# Patient Record
Sex: Female | Born: 1935 | Race: White | Hispanic: No | Smoking: Former smoker
Health system: Southern US, Community
[De-identification: ages and names within clinical notes are randomized; demographics above are authoritative.]

## PROBLEM LIST (undated history)

## (undated) DIAGNOSIS — R002 Palpitations: Secondary | ICD-10-CM

## (undated) DIAGNOSIS — I639 Cerebral infarction, unspecified: Secondary | ICD-10-CM

## (undated) DIAGNOSIS — D649 Anemia, unspecified: Secondary | ICD-10-CM

## (undated) DIAGNOSIS — F329 Major depressive disorder, single episode, unspecified: Secondary | ICD-10-CM

## (undated) DIAGNOSIS — K219 Gastro-esophageal reflux disease without esophagitis: Secondary | ICD-10-CM

## (undated) DIAGNOSIS — B019 Varicella without complication: Secondary | ICD-10-CM

## (undated) DIAGNOSIS — E7800 Pure hypercholesterolemia, unspecified: Secondary | ICD-10-CM

## (undated) DIAGNOSIS — F32A Depression, unspecified: Secondary | ICD-10-CM

## (undated) DIAGNOSIS — I10 Essential (primary) hypertension: Secondary | ICD-10-CM

## (undated) HISTORY — DX: Cerebral infarction, unspecified: I63.9

## (undated) HISTORY — DX: Gastro-esophageal reflux disease without esophagitis: K21.9

## (undated) HISTORY — DX: Palpitations: R00.2

## (undated) HISTORY — DX: Pure hypercholesterolemia, unspecified: E78.00

## (undated) HISTORY — DX: Varicella without complication: B01.9

## (undated) HISTORY — DX: Essential (primary) hypertension: I10

## (undated) HISTORY — DX: Major depressive disorder, single episode, unspecified: F32.9

## (undated) HISTORY — DX: Anemia, unspecified: D64.9

## (undated) HISTORY — DX: Depression, unspecified: F32.A

## (undated) HISTORY — PX: APPENDECTOMY: SHX54

---

## 2004-06-24 ENCOUNTER — Ambulatory Visit: Payer: Self-pay | Admitting: Internal Medicine

## 2004-08-15 ENCOUNTER — Inpatient Hospital Stay: Payer: Self-pay | Admitting: Internal Medicine

## 2004-08-15 ENCOUNTER — Other Ambulatory Visit: Payer: Self-pay

## 2004-08-18 ENCOUNTER — Ambulatory Visit: Payer: Self-pay | Admitting: Physical Medicine & Rehabilitation

## 2004-08-18 ENCOUNTER — Inpatient Hospital Stay (HOSPITAL_COMMUNITY)
Admission: RE | Admit: 2004-08-18 | Discharge: 2004-08-26 | Payer: Self-pay | Admitting: Physical Medicine & Rehabilitation

## 2004-08-24 DIAGNOSIS — I639 Cerebral infarction, unspecified: Secondary | ICD-10-CM

## 2004-08-24 HISTORY — DX: Cerebral infarction, unspecified: I63.9

## 2004-10-04 ENCOUNTER — Ambulatory Visit: Payer: Self-pay | Admitting: Physical Medicine & Rehabilitation

## 2004-10-04 ENCOUNTER — Encounter
Admission: RE | Admit: 2004-10-04 | Discharge: 2005-01-02 | Payer: Self-pay | Admitting: Physical Medicine & Rehabilitation

## 2005-07-06 ENCOUNTER — Ambulatory Visit: Payer: Self-pay | Admitting: Internal Medicine

## 2006-03-10 ENCOUNTER — Ambulatory Visit: Payer: Self-pay | Admitting: Ophthalmology

## 2006-03-20 ENCOUNTER — Ambulatory Visit: Payer: Self-pay | Admitting: Ophthalmology

## 2006-07-05 ENCOUNTER — Ambulatory Visit: Payer: Self-pay | Admitting: Ophthalmology

## 2006-07-06 ENCOUNTER — Ambulatory Visit: Payer: Self-pay | Admitting: Internal Medicine

## 2006-07-10 ENCOUNTER — Ambulatory Visit: Payer: Self-pay | Admitting: Ophthalmology

## 2006-08-01 ENCOUNTER — Ambulatory Visit: Payer: Self-pay | Admitting: Internal Medicine

## 2007-09-04 ENCOUNTER — Ambulatory Visit: Payer: Self-pay | Admitting: Internal Medicine

## 2007-10-08 ENCOUNTER — Ambulatory Visit: Payer: Self-pay | Admitting: Gastroenterology

## 2008-09-22 ENCOUNTER — Ambulatory Visit: Payer: Self-pay | Admitting: Internal Medicine

## 2009-07-05 ENCOUNTER — Emergency Department: Payer: Self-pay | Admitting: Emergency Medicine

## 2010-06-02 ENCOUNTER — Ambulatory Visit: Payer: Self-pay | Admitting: Internal Medicine

## 2011-02-04 ENCOUNTER — Ambulatory Visit: Payer: Self-pay | Admitting: Gastroenterology

## 2011-02-04 DIAGNOSIS — K5730 Diverticulosis of large intestine without perforation or abscess without bleeding: Secondary | ICD-10-CM | POA: Diagnosis not present

## 2011-02-04 DIAGNOSIS — Z86010 Personal history of colonic polyps: Secondary | ICD-10-CM | POA: Diagnosis not present

## 2011-02-04 DIAGNOSIS — D649 Anemia, unspecified: Secondary | ICD-10-CM | POA: Diagnosis not present

## 2011-02-04 DIAGNOSIS — Z09 Encounter for follow-up examination after completed treatment for conditions other than malignant neoplasm: Secondary | ICD-10-CM | POA: Diagnosis not present

## 2011-02-04 DIAGNOSIS — I498 Other specified cardiac arrhythmias: Secondary | ICD-10-CM | POA: Diagnosis not present

## 2011-02-04 DIAGNOSIS — Q438 Other specified congenital malformations of intestine: Secondary | ICD-10-CM | POA: Diagnosis not present

## 2011-02-04 DIAGNOSIS — I10 Essential (primary) hypertension: Secondary | ICD-10-CM | POA: Diagnosis not present

## 2011-02-04 DIAGNOSIS — E7800 Pure hypercholesterolemia, unspecified: Secondary | ICD-10-CM | POA: Diagnosis not present

## 2011-02-04 DIAGNOSIS — Z8679 Personal history of other diseases of the circulatory system: Secondary | ICD-10-CM | POA: Diagnosis not present

## 2011-02-04 DIAGNOSIS — Z79899 Other long term (current) drug therapy: Secondary | ICD-10-CM | POA: Diagnosis not present

## 2011-02-04 DIAGNOSIS — Z5309 Procedure and treatment not carried out because of other contraindication: Secondary | ICD-10-CM | POA: Diagnosis not present

## 2011-02-04 DIAGNOSIS — R1013 Epigastric pain: Secondary | ICD-10-CM | POA: Diagnosis not present

## 2011-02-04 DIAGNOSIS — L851 Acquired keratosis [keratoderma] palmaris et plantaris: Secondary | ICD-10-CM | POA: Diagnosis not present

## 2011-02-04 DIAGNOSIS — G43909 Migraine, unspecified, not intractable, without status migrainosus: Secondary | ICD-10-CM | POA: Diagnosis not present

## 2011-02-08 LAB — PATHOLOGY REPORT

## 2011-02-14 DIAGNOSIS — N289 Disorder of kidney and ureter, unspecified: Secondary | ICD-10-CM | POA: Diagnosis not present

## 2011-02-14 DIAGNOSIS — K219 Gastro-esophageal reflux disease without esophagitis: Secondary | ICD-10-CM | POA: Diagnosis not present

## 2011-02-14 DIAGNOSIS — D649 Anemia, unspecified: Secondary | ICD-10-CM | POA: Diagnosis not present

## 2011-02-14 DIAGNOSIS — R634 Abnormal weight loss: Secondary | ICD-10-CM | POA: Diagnosis not present

## 2011-02-14 DIAGNOSIS — R1084 Generalized abdominal pain: Secondary | ICD-10-CM | POA: Diagnosis not present

## 2011-02-15 DIAGNOSIS — N289 Disorder of kidney and ureter, unspecified: Secondary | ICD-10-CM | POA: Diagnosis not present

## 2011-02-17 ENCOUNTER — Ambulatory Visit: Payer: Self-pay | Admitting: Gastroenterology

## 2011-02-17 DIAGNOSIS — K219 Gastro-esophageal reflux disease without esophagitis: Secondary | ICD-10-CM | POA: Diagnosis not present

## 2011-02-17 DIAGNOSIS — K449 Diaphragmatic hernia without obstruction or gangrene: Secondary | ICD-10-CM | POA: Diagnosis not present

## 2011-03-01 DIAGNOSIS — D649 Anemia, unspecified: Secondary | ICD-10-CM | POA: Diagnosis not present

## 2011-03-01 DIAGNOSIS — K219 Gastro-esophageal reflux disease without esophagitis: Secondary | ICD-10-CM | POA: Diagnosis not present

## 2011-04-05 DIAGNOSIS — E7800 Pure hypercholesterolemia, unspecified: Secondary | ICD-10-CM | POA: Diagnosis not present

## 2011-04-05 DIAGNOSIS — Z79899 Other long term (current) drug therapy: Secondary | ICD-10-CM | POA: Diagnosis not present

## 2011-04-05 DIAGNOSIS — N289 Disorder of kidney and ureter, unspecified: Secondary | ICD-10-CM | POA: Diagnosis not present

## 2011-04-05 DIAGNOSIS — D649 Anemia, unspecified: Secondary | ICD-10-CM | POA: Diagnosis not present

## 2011-04-12 DIAGNOSIS — E7800 Pure hypercholesterolemia, unspecified: Secondary | ICD-10-CM | POA: Diagnosis not present

## 2011-04-12 DIAGNOSIS — I10 Essential (primary) hypertension: Secondary | ICD-10-CM | POA: Diagnosis not present

## 2011-04-12 DIAGNOSIS — R42 Dizziness and giddiness: Secondary | ICD-10-CM | POA: Diagnosis not present

## 2011-04-12 DIAGNOSIS — N289 Disorder of kidney and ureter, unspecified: Secondary | ICD-10-CM | POA: Diagnosis not present

## 2011-04-14 DIAGNOSIS — N289 Disorder of kidney and ureter, unspecified: Secondary | ICD-10-CM | POA: Diagnosis not present

## 2011-04-15 DIAGNOSIS — N289 Disorder of kidney and ureter, unspecified: Secondary | ICD-10-CM | POA: Diagnosis not present

## 2011-04-20 ENCOUNTER — Ambulatory Visit: Payer: Self-pay | Admitting: Internal Medicine

## 2011-04-20 DIAGNOSIS — N289 Disorder of kidney and ureter, unspecified: Secondary | ICD-10-CM | POA: Diagnosis not present

## 2011-04-21 DIAGNOSIS — N289 Disorder of kidney and ureter, unspecified: Secondary | ICD-10-CM | POA: Diagnosis not present

## 2011-05-11 DIAGNOSIS — N289 Disorder of kidney and ureter, unspecified: Secondary | ICD-10-CM | POA: Diagnosis not present

## 2011-05-16 DIAGNOSIS — M549 Dorsalgia, unspecified: Secondary | ICD-10-CM | POA: Diagnosis not present

## 2011-05-16 DIAGNOSIS — D649 Anemia, unspecified: Secondary | ICD-10-CM | POA: Diagnosis not present

## 2011-05-16 DIAGNOSIS — E7800 Pure hypercholesterolemia, unspecified: Secondary | ICD-10-CM | POA: Diagnosis not present

## 2011-05-16 DIAGNOSIS — I10 Essential (primary) hypertension: Secondary | ICD-10-CM | POA: Diagnosis not present

## 2011-05-19 ENCOUNTER — Ambulatory Visit: Payer: Self-pay | Admitting: Internal Medicine

## 2011-05-19 DIAGNOSIS — R0989 Other specified symptoms and signs involving the circulatory and respiratory systems: Secondary | ICD-10-CM | POA: Diagnosis not present

## 2011-05-19 DIAGNOSIS — I6529 Occlusion and stenosis of unspecified carotid artery: Secondary | ICD-10-CM | POA: Diagnosis not present

## 2011-06-01 DIAGNOSIS — L819 Disorder of pigmentation, unspecified: Secondary | ICD-10-CM | POA: Diagnosis not present

## 2011-06-01 DIAGNOSIS — L821 Other seborrheic keratosis: Secondary | ICD-10-CM | POA: Diagnosis not present

## 2011-06-01 DIAGNOSIS — D692 Other nonthrombocytopenic purpura: Secondary | ICD-10-CM | POA: Diagnosis not present

## 2011-06-01 DIAGNOSIS — D1801 Hemangioma of skin and subcutaneous tissue: Secondary | ICD-10-CM | POA: Diagnosis not present

## 2011-06-01 DIAGNOSIS — C44319 Basal cell carcinoma of skin of other parts of face: Secondary | ICD-10-CM | POA: Diagnosis not present

## 2011-06-29 DIAGNOSIS — C44319 Basal cell carcinoma of skin of other parts of face: Secondary | ICD-10-CM | POA: Diagnosis not present

## 2011-06-29 DIAGNOSIS — L578 Other skin changes due to chronic exposure to nonionizing radiation: Secondary | ICD-10-CM | POA: Diagnosis not present

## 2011-06-29 DIAGNOSIS — L821 Other seborrheic keratosis: Secondary | ICD-10-CM | POA: Diagnosis not present

## 2011-06-29 DIAGNOSIS — L570 Actinic keratosis: Secondary | ICD-10-CM | POA: Diagnosis not present

## 2011-07-25 DIAGNOSIS — E7800 Pure hypercholesterolemia, unspecified: Secondary | ICD-10-CM | POA: Diagnosis not present

## 2011-07-25 DIAGNOSIS — Z79899 Other long term (current) drug therapy: Secondary | ICD-10-CM | POA: Diagnosis not present

## 2011-07-25 DIAGNOSIS — I10 Essential (primary) hypertension: Secondary | ICD-10-CM | POA: Diagnosis not present

## 2011-07-25 LAB — LIPID PANEL
Cholesterol: 174 mg/dL (ref 0–200)
HDL: 50 mg/dL (ref 35–70)
LDL Cholesterol: 93 mg/dL
Triglycerides: 153 mg/dL (ref 40–160)

## 2011-08-01 DIAGNOSIS — E7800 Pure hypercholesterolemia, unspecified: Secondary | ICD-10-CM | POA: Diagnosis not present

## 2011-08-01 DIAGNOSIS — I6350 Cerebral infarction due to unspecified occlusion or stenosis of unspecified cerebral artery: Secondary | ICD-10-CM | POA: Diagnosis not present

## 2011-08-01 DIAGNOSIS — D649 Anemia, unspecified: Secondary | ICD-10-CM | POA: Diagnosis not present

## 2011-08-01 DIAGNOSIS — I10 Essential (primary) hypertension: Secondary | ICD-10-CM | POA: Diagnosis not present

## 2011-08-22 DIAGNOSIS — D649 Anemia, unspecified: Secondary | ICD-10-CM | POA: Diagnosis not present

## 2011-09-06 DIAGNOSIS — D649 Anemia, unspecified: Secondary | ICD-10-CM | POA: Diagnosis not present

## 2011-10-20 DIAGNOSIS — Z23 Encounter for immunization: Secondary | ICD-10-CM | POA: Diagnosis not present

## 2011-10-25 DIAGNOSIS — Z85828 Personal history of other malignant neoplasm of skin: Secondary | ICD-10-CM | POA: Diagnosis not present

## 2011-10-25 DIAGNOSIS — L905 Scar conditions and fibrosis of skin: Secondary | ICD-10-CM | POA: Diagnosis not present

## 2011-11-07 DIAGNOSIS — D649 Anemia, unspecified: Secondary | ICD-10-CM | POA: Diagnosis not present

## 2012-01-09 ENCOUNTER — Encounter: Payer: Self-pay | Admitting: *Deleted

## 2012-01-09 DIAGNOSIS — I10 Essential (primary) hypertension: Secondary | ICD-10-CM

## 2012-01-09 DIAGNOSIS — E7800 Pure hypercholesterolemia, unspecified: Secondary | ICD-10-CM

## 2012-01-09 DIAGNOSIS — I639 Cerebral infarction, unspecified: Secondary | ICD-10-CM

## 2012-01-10 ENCOUNTER — Encounter: Payer: Self-pay | Admitting: *Deleted

## 2012-01-10 ENCOUNTER — Ambulatory Visit (INDEPENDENT_AMBULATORY_CARE_PROVIDER_SITE_OTHER): Payer: Medicare Other | Admitting: Internal Medicine

## 2012-01-10 ENCOUNTER — Encounter: Payer: Self-pay | Admitting: Internal Medicine

## 2012-01-10 VITALS — BP 134/64 | HR 72 | Temp 98.3°F | Ht 62.5 in | Wt 135.5 lb

## 2012-01-10 DIAGNOSIS — I10 Essential (primary) hypertension: Secondary | ICD-10-CM | POA: Insufficient documentation

## 2012-01-10 DIAGNOSIS — R0989 Other specified symptoms and signs involving the circulatory and respiratory systems: Secondary | ICD-10-CM

## 2012-01-10 DIAGNOSIS — I6350 Cerebral infarction due to unspecified occlusion or stenosis of unspecified cerebral artery: Secondary | ICD-10-CM | POA: Insufficient documentation

## 2012-01-10 DIAGNOSIS — E7800 Pure hypercholesterolemia, unspecified: Secondary | ICD-10-CM

## 2012-01-10 DIAGNOSIS — Z8673 Personal history of transient ischemic attack (TIA), and cerebral infarction without residual deficits: Secondary | ICD-10-CM | POA: Insufficient documentation

## 2012-01-10 DIAGNOSIS — N289 Disorder of kidney and ureter, unspecified: Secondary | ICD-10-CM

## 2012-01-10 DIAGNOSIS — D649 Anemia, unspecified: Secondary | ICD-10-CM

## 2012-01-10 DIAGNOSIS — I639 Cerebral infarction, unspecified: Secondary | ICD-10-CM

## 2012-01-10 DIAGNOSIS — M79609 Pain in unspecified limb: Secondary | ICD-10-CM | POA: Diagnosis not present

## 2012-01-10 DIAGNOSIS — M79603 Pain in arm, unspecified: Secondary | ICD-10-CM

## 2012-01-11 ENCOUNTER — Encounter: Payer: Self-pay | Admitting: Internal Medicine

## 2012-01-11 DIAGNOSIS — N289 Disorder of kidney and ureter, unspecified: Secondary | ICD-10-CM | POA: Insufficient documentation

## 2012-01-11 DIAGNOSIS — D509 Iron deficiency anemia, unspecified: Secondary | ICD-10-CM | POA: Insufficient documentation

## 2012-01-11 NOTE — Assessment & Plan Note (Signed)
Last Cr stable - 1.3.  Renal ultrasound 04/20/11 - negative.  U/a did not reveal blood or protein.  Follow.

## 2012-01-11 NOTE — Progress Notes (Signed)
Subjective:    Patient ID: Rachel Martinez, female    DOB: July 16, 1927, 78 y.o.   MRN: [id number]  HPI 78 year old female with past history of hypertension, hypercholesterolemia and CVA who comes in today for a scheduled follow up.  She states she is doing relatively well.  Feels things are stable.  She is having some right upper arm pain.  States she tripped and fell approximately 6 weeks ago.  Caught herself with her right hand and arm.  Since that time, has been having pain in her right upper arm.  Hurts to try and fully extend her arm.  No bruising.  Did not hit her head.  Lying down - worse.  No chest pain or tightness.  No sob.  States she is eating and drinking well.  Bowels stable.   Past Medical History  Diagnosis Date  . Heart palpitations   . Hypercholesteremia   . Hypertension   . CVA (cerebral vascular accident) 8/06  . Chicken pox   . Stroke     Current Outpatient Prescriptions on File Prior to Visit  Medication Sig Dispense Refill  . calcium carbonate (TUMS - DOSED IN MG ELEMENTAL CALCIUM) 500 MG chewable tablet Chew 1 tablet by mouth 3 (three) times daily.      Marland Kitchen diltiazem (TIAZAC) 120 MG 24 hr capsule Take 120 mg by mouth daily.      Marland Kitchen dipyridamole-aspirin (AGGRENOX) 200-25 MG per 12 hr capsule Take 1 capsule by mouth 2 (two) times daily.      Marland Kitchen lisinopril (PRINIVIL,ZESTRIL) 10 MG tablet Take 10 mg by mouth daily.      . pravastatin (PRAVACHOL) 40 MG tablet Take 40 mg by mouth daily.      . fish oil-omega-3 fatty acids 1000 MG capsule Take 1 g by mouth 2 (two) times daily.       Marland Kitchen FLUoxetine (PROZAC) 10 MG capsule Take 10 mg by mouth daily.      Marland Kitchen oxybutynin (DITROPAN) 5 MG tablet Take 5 mg by mouth daily.      . pantoprazole (PROTONIX) 40 MG tablet Take 40 mg by mouth daily.        Review of Systems Patient denies any headache, lightheadedness or dizziness.  No significant sinus or allergy symptoms.  No chest pain, tightness or palpitations.  No increased shortness of  breath, cough or congestion.  No nausea or vomiting.  No abdominal pain or cramping.  No bowel change, such as diarrhea, constipation, BRBPR or melana.  No urine change.  Upper arm pain - persistent as outlined.  Pain with increased rom.       Objective:   Physical Exam Filed Vitals:   01/10/12 1131  BP: 134/64  Pulse: 72  Temp: 98.3 F (29.38 C)   78 year old female in no acute distress.   HEENT:  Nares - clear.  OP- without lesions or erythema.  NECK:  Supple, nontender.  Right carotid bruit.   HEART:  Appears to be regular. LUNGS:  Without crackles or wheezing audible.  Respirations even and unlabored.   RADIAL PULSE:  Equal bilaterally.  ABDOMEN:  Soft, nontender.  No audible abdominal bruit.   EXTREMITIES:  No increased edema to be present.  Increased pain upper arm with attempts at full extension.  No pain to palpation.  Pain more localized in the mid to upper arm.  Assessment & Plan:  MSK.  Persistent upper arm pain s/p fall.  See above.  Unable to take antiinflammatories.  Tylenol not helping.  Avoid narcotics.  Will refer to physical therapy for evaluation and treatment.    RIGHT CAROTID BRUIT.  Check carotid ultrasound.    PREVIOUS WEIGHT LOSS.  Weight had been stable recently.  Last weight 137.  Today 135.  Had GI w/up.  Unable to perform EGD.  Colonoscopy performed.  Had UGI that revealed a hiatal hernia and reflux.  On protonix.  States she is eating and drinking well.  Follow.  Currently asymptomatic.  Follow.   INCREASED PSYCHOSOCIAL STRESSORS.  Currently stable.  Follow.    HEALTH MAINTENANCE.  GI w/up as outlined.  Schedule her for a physical.  Need results of last mammogram.  Apparently had over the summer.

## 2012-01-11 NOTE — Assessment & Plan Note (Signed)
On Aggrenox and doing well.  Follow.

## 2012-01-11 NOTE — Assessment & Plan Note (Signed)
Blood pressure under good control.  Same medications.  Follow metabolic panel.    

## 2012-01-11 NOTE — Assessment & Plan Note (Signed)
On pravastatin.  Follow lipid profile and liver panel.    

## 2012-01-11 NOTE — Assessment & Plan Note (Signed)
Had GI w/up as outlined.  Last hgb per her report - stable.  Follow.

## 2012-01-23 ENCOUNTER — Other Ambulatory Visit: Payer: Self-pay | Admitting: Internal Medicine

## 2012-01-23 MED ORDER — ASPIRIN-DIPYRIDAMOLE ER 25-200 MG PO CP12: 1.0000 | ORAL_CAPSULE | Freq: Two times a day (BID) | ORAL | Status: DC

## 2012-01-23 NOTE — Telephone Encounter (Signed)
Sent in to pharmacy.  

## 2012-01-23 NOTE — Telephone Encounter (Signed)
Aggrenox 25 mg -200 capsule  Take 1 capsule by mouth twice a day  # 30

## 2012-02-13 DIAGNOSIS — M25519 Pain in unspecified shoulder: Secondary | ICD-10-CM | POA: Diagnosis not present

## 2012-02-13 DIAGNOSIS — M79609 Pain in unspecified limb: Secondary | ICD-10-CM | POA: Diagnosis not present

## 2012-02-15 DIAGNOSIS — M79609 Pain in unspecified limb: Secondary | ICD-10-CM | POA: Diagnosis not present

## 2012-02-15 DIAGNOSIS — M25519 Pain in unspecified shoulder: Secondary | ICD-10-CM | POA: Diagnosis not present

## 2012-02-17 DIAGNOSIS — M79609 Pain in unspecified limb: Secondary | ICD-10-CM | POA: Diagnosis not present

## 2012-02-17 DIAGNOSIS — M25519 Pain in unspecified shoulder: Secondary | ICD-10-CM | POA: Diagnosis not present

## 2012-02-20 DIAGNOSIS — M79609 Pain in unspecified limb: Secondary | ICD-10-CM | POA: Diagnosis not present

## 2012-02-20 DIAGNOSIS — M25519 Pain in unspecified shoulder: Secondary | ICD-10-CM | POA: Diagnosis not present

## 2012-02-24 DIAGNOSIS — M25519 Pain in unspecified shoulder: Secondary | ICD-10-CM | POA: Diagnosis not present

## 2012-02-24 DIAGNOSIS — M79609 Pain in unspecified limb: Secondary | ICD-10-CM | POA: Diagnosis not present

## 2012-02-25 DIAGNOSIS — M79609 Pain in unspecified limb: Secondary | ICD-10-CM | POA: Diagnosis not present

## 2012-02-25 DIAGNOSIS — M25519 Pain in unspecified shoulder: Secondary | ICD-10-CM | POA: Diagnosis not present

## 2012-02-27 DIAGNOSIS — M25519 Pain in unspecified shoulder: Secondary | ICD-10-CM | POA: Diagnosis not present

## 2012-02-27 DIAGNOSIS — M79609 Pain in unspecified limb: Secondary | ICD-10-CM | POA: Diagnosis not present

## 2012-02-29 DIAGNOSIS — M25519 Pain in unspecified shoulder: Secondary | ICD-10-CM | POA: Diagnosis not present

## 2012-02-29 DIAGNOSIS — M79609 Pain in unspecified limb: Secondary | ICD-10-CM | POA: Diagnosis not present

## 2012-03-02 DIAGNOSIS — M79609 Pain in unspecified limb: Secondary | ICD-10-CM | POA: Diagnosis not present

## 2012-03-02 DIAGNOSIS — M25519 Pain in unspecified shoulder: Secondary | ICD-10-CM | POA: Diagnosis not present

## 2012-03-05 DIAGNOSIS — M79609 Pain in unspecified limb: Secondary | ICD-10-CM | POA: Diagnosis not present

## 2012-03-05 DIAGNOSIS — M25519 Pain in unspecified shoulder: Secondary | ICD-10-CM | POA: Diagnosis not present

## 2012-03-12 DIAGNOSIS — M25519 Pain in unspecified shoulder: Secondary | ICD-10-CM | POA: Diagnosis not present

## 2012-03-12 DIAGNOSIS — M79609 Pain in unspecified limb: Secondary | ICD-10-CM | POA: Diagnosis not present

## 2012-03-14 DIAGNOSIS — M79609 Pain in unspecified limb: Secondary | ICD-10-CM | POA: Diagnosis not present

## 2012-03-14 DIAGNOSIS — M25519 Pain in unspecified shoulder: Secondary | ICD-10-CM | POA: Diagnosis not present

## 2012-03-16 DIAGNOSIS — M25519 Pain in unspecified shoulder: Secondary | ICD-10-CM | POA: Diagnosis not present

## 2012-03-16 DIAGNOSIS — M79609 Pain in unspecified limb: Secondary | ICD-10-CM | POA: Diagnosis not present

## 2012-03-19 DIAGNOSIS — M79609 Pain in unspecified limb: Secondary | ICD-10-CM | POA: Diagnosis not present

## 2012-03-19 DIAGNOSIS — M25519 Pain in unspecified shoulder: Secondary | ICD-10-CM | POA: Diagnosis not present

## 2012-03-21 DIAGNOSIS — M79609 Pain in unspecified limb: Secondary | ICD-10-CM | POA: Diagnosis not present

## 2012-03-21 DIAGNOSIS — M25519 Pain in unspecified shoulder: Secondary | ICD-10-CM | POA: Diagnosis not present

## 2012-03-24 DIAGNOSIS — M79609 Pain in unspecified limb: Secondary | ICD-10-CM | POA: Diagnosis not present

## 2012-03-24 DIAGNOSIS — M25519 Pain in unspecified shoulder: Secondary | ICD-10-CM | POA: Diagnosis not present

## 2012-03-29 DIAGNOSIS — M25519 Pain in unspecified shoulder: Secondary | ICD-10-CM | POA: Diagnosis not present

## 2012-04-09 ENCOUNTER — Encounter: Payer: Self-pay | Admitting: Internal Medicine

## 2012-04-09 ENCOUNTER — Ambulatory Visit (INDEPENDENT_AMBULATORY_CARE_PROVIDER_SITE_OTHER): Payer: BLUE CROSS/BLUE SHIELD | Admitting: Internal Medicine

## 2012-04-09 VITALS — BP 130/60 | HR 62 | Temp 98.0°F | Ht 62.5 in | Wt 132.0 lb

## 2012-04-09 DIAGNOSIS — D649 Anemia, unspecified: Secondary | ICD-10-CM

## 2012-04-09 DIAGNOSIS — I10 Essential (primary) hypertension: Secondary | ICD-10-CM

## 2012-04-09 DIAGNOSIS — N289 Disorder of kidney and ureter, unspecified: Secondary | ICD-10-CM

## 2012-04-09 DIAGNOSIS — I6350 Cerebral infarction due to unspecified occlusion or stenosis of unspecified cerebral artery: Secondary | ICD-10-CM

## 2012-04-09 DIAGNOSIS — E7800 Pure hypercholesterolemia, unspecified: Secondary | ICD-10-CM | POA: Diagnosis not present

## 2012-04-09 DIAGNOSIS — Z1239 Encounter for other screening for malignant neoplasm of breast: Secondary | ICD-10-CM

## 2012-04-09 DIAGNOSIS — I639 Cerebral infarction, unspecified: Secondary | ICD-10-CM

## 2012-04-15 ENCOUNTER — Encounter: Payer: Self-pay | Admitting: Internal Medicine

## 2012-04-15 NOTE — Assessment & Plan Note (Signed)
On pravastatin.  Follow lipid profile and liver panel.    

## 2012-04-15 NOTE — Assessment & Plan Note (Signed)
Blood pressure under good control.  Same medications.  Follow metabolic panel.    

## 2012-04-15 NOTE — Assessment & Plan Note (Signed)
Had GI w/up as outlined.  Last hgb per her report - stable.  Follow.

## 2012-04-15 NOTE — Assessment & Plan Note (Signed)
On Aggrenox and doing well.  Follow.

## 2012-04-15 NOTE — Progress Notes (Signed)
Subjective:    Patient ID: Rachel Martinez, female    DOB: 10-16-1927, 79 y.o.   MRN: [id number]  HPI 79 year old female with past history of hypertension, hypercholesterolemia and CVA who comes in today to follow up on these issues as well as for a complete physical exam.  She states she is doing relatively well.  Feels things are stable.  Went to physical therapy for her right arm.  Doing better.  She is doing exercises at home now.  Eating and drinking well.  Breathing stable. Bowels stable.   Past Medical History  Diagnosis Date  . Heart palpitations   . Hypercholesteremia   . Hypertension   . CVA (cerebral vascular accident) 8/06  . Chicken pox   . Stroke     Current Outpatient Prescriptions on File Prior to Visit  Medication Sig Dispense Refill  . calcium carbonate (TUMS - DOSED IN MG ELEMENTAL CALCIUM) 500 MG chewable tablet Chew 1 tablet by mouth 3 (three) times daily.      Marland Kitchen diltiazem (TIAZAC) 120 MG 24 hr capsule Take 120 mg by mouth daily.      Marland Kitchen dipyridamole-aspirin (AGGRENOX) 200-25 MG per 12 hr capsule Take 1 capsule by mouth 2 (two) times daily.  60 capsule  5  . fish oil-omega-3 fatty acids 1000 MG capsule Take 1 g by mouth 2 (two) times daily.       Marland Kitchen FLUoxetine (PROZAC) 10 MG capsule Take 10 mg by mouth daily.      Marland Kitchen lisinopril (PRINIVIL,ZESTRIL) 10 MG tablet Take 10 mg by mouth daily.      . Multiple Vitamin (MULTIVITAMIN) capsule Take 1 capsule by mouth daily.      Marland Kitchen oxybutynin (DITROPAN) 5 MG tablet Take 5 mg by mouth daily.      . pantoprazole (PROTONIX) 40 MG tablet Take 40 mg by mouth daily.      . pravastatin (PRAVACHOL) 40 MG tablet Take 40 mg by mouth daily.       No current facility-administered medications on file prior to visit.    Review of Systems Patient denies any headache, lightheadedness or dizziness.  No significant sinus or allergy symptoms.  No chest pain, tightness or palpitations.  No increased shortness of breath, cough or congestion.  No  nausea or vomiting.  No abdominal pain or cramping.  No bowel change, such as diarrhea, constipation, BRBPR or melana.  No urine change.  Upper arm pain - better.  Increased rom.  Went to therapy.  Still doing exercises at home.      Objective:   Physical Exam  Filed Vitals:   04/09/12 1320  BP: 130/60  Pulse: 62  Temp: 98 F (36.7 C)   Blood pressure recheck:  142/68, pulse 82  79 year old female in no acute distress.   HEENT:  Nares- clear.  Oropharynx - without lesions. NECK:  Supple.  Nontender.    HEART:  Appears to be regular. LUNGS:  No crackles or wheezing audible.  Respirations even and unlabored.  RADIAL PULSE:  Equal bilaterally.    BREASTS:  No nipple discharge or nipple retraction present.  Could not appreciate any distinct nodules or axillary adenopathy.  ABDOMEN:  Soft, nontender.  Bowel sounds present and normal.  No audible abdominal bruit.  GU:  She declined.    RECTAL:  She declined.  EXTREMITIES:  No increased edema present.  DP pulses palpable and equal bilaterally.  Assessment & Plan:  MSK. Better after therapy.  Continue home stretches and exercises.  Follow.   RIGHT CAROTID BRUIT.  Was supposed to have been scheduled for a carotid ultrasound.  Obtain results.     PREVIOUS WEIGHT LOSS.  Weight had been stable recently.  Last weight 135.  Today 132.  (check prior - 137).  Had GI w/up.  Unable to perform EGD.  Colonoscopy performed (02/04/11 - diverticulosis/redundant colon).  Had UGI that revealed a hiatal hernia and reflux.  On protonix.  States she is eating and drinking well.  Follow.  Currently asymptomatic.  Follow.    INCREASED PSYCHOSOCIAL STRESSORS.  Currently stable.  Follow.    HEALTH MAINTENANCE.  GI w/up as outlined. Physical today.  She declined pelvic exam.  Mammogram 06/02/10 - Birads II.  Schedule mammogram.

## 2012-04-15 NOTE — Assessment & Plan Note (Signed)
Last Cr stable - 1.3.  Renal ultrasound 04/20/11 - negative.  U/a did not reveal blood or protein.  Follow.

## 2012-04-20 ENCOUNTER — Other Ambulatory Visit (INDEPENDENT_AMBULATORY_CARE_PROVIDER_SITE_OTHER): Payer: BLUE CROSS/BLUE SHIELD

## 2012-04-20 DIAGNOSIS — E7800 Pure hypercholesterolemia, unspecified: Secondary | ICD-10-CM | POA: Diagnosis not present

## 2012-04-20 DIAGNOSIS — D649 Anemia, unspecified: Secondary | ICD-10-CM | POA: Diagnosis not present

## 2012-04-20 DIAGNOSIS — I10 Essential (primary) hypertension: Secondary | ICD-10-CM

## 2012-04-20 LAB — HEPATIC FUNCTION PANEL
Alkaline Phosphatase: 54 U/L (ref 39–117)
Bilirubin, Direct: 0 mg/dL (ref 0.0–0.3)
Total Bilirubin: 0.4 mg/dL (ref 0.3–1.2)

## 2012-04-20 LAB — BASIC METABOLIC PANEL
BUN: 22 mg/dL (ref 6–23)
CO2: 26 mEq/L (ref 19–32)
Chloride: 105 mEq/L (ref 96–112)
Creatinine, Ser: 1.3 mg/dL — ABNORMAL HIGH (ref 0.4–1.2)

## 2012-04-20 LAB — CBC WITH DIFFERENTIAL/PLATELET
Basophils Absolute: 0 10*3/uL (ref 0.0–0.1)
Basophils Relative: 0.5 % (ref 0.0–3.0)
Eosinophils Absolute: 0.1 10*3/uL (ref 0.0–0.7)
Lymphocytes Relative: 33.9 % (ref 12.0–46.0)
MCHC: 34.1 g/dL (ref 30.0–36.0)
MCV: 95.3 fl (ref 78.0–100.0)
Monocytes Absolute: 0.3 10*3/uL (ref 0.1–1.0)
Neutrophils Relative %: 57.8 % (ref 43.0–77.0)
Platelets: 182 10*3/uL (ref 150.0–400.0)
RDW: 13.3 % (ref 11.5–14.6)

## 2012-04-20 LAB — LIPID PANEL
LDL Cholesterol: 98 mg/dL (ref 0–99)
Total CHOL/HDL Ratio: 3

## 2012-04-26 ENCOUNTER — Ambulatory Visit: Payer: Self-pay | Admitting: Internal Medicine

## 2012-04-26 DIAGNOSIS — Z1231 Encounter for screening mammogram for malignant neoplasm of breast: Secondary | ICD-10-CM | POA: Diagnosis not present

## 2012-04-27 ENCOUNTER — Encounter: Payer: Self-pay | Admitting: *Deleted

## 2012-05-10 ENCOUNTER — Encounter: Payer: Self-pay | Admitting: Internal Medicine

## 2012-05-10 ENCOUNTER — Encounter: Payer: Self-pay | Admitting: Dentistry

## 2012-07-10 ENCOUNTER — Other Ambulatory Visit: Payer: Self-pay | Admitting: *Deleted

## 2012-07-10 MED ORDER — DILTIAZEM HCL ER BEADS 120 MG PO CP24: 120.0000 mg | ORAL_CAPSULE | Freq: Every day | ORAL | Status: DC

## 2012-07-10 MED ORDER — LISINOPRIL 10 MG PO TABS: 10.0000 mg | ORAL_TABLET | Freq: Every day | ORAL | Status: DC

## 2012-08-09 ENCOUNTER — Encounter: Payer: Self-pay | Admitting: Internal Medicine

## 2012-08-09 ENCOUNTER — Ambulatory Visit (INDEPENDENT_AMBULATORY_CARE_PROVIDER_SITE_OTHER): Payer: BLUE CROSS/BLUE SHIELD | Admitting: Internal Medicine

## 2012-08-09 VITALS — BP 130/70 | HR 56 | Temp 98.5°F | Ht 62.5 in | Wt 132.5 lb

## 2012-08-09 DIAGNOSIS — E7800 Pure hypercholesterolemia, unspecified: Secondary | ICD-10-CM | POA: Diagnosis not present

## 2012-08-09 DIAGNOSIS — I6350 Cerebral infarction due to unspecified occlusion or stenosis of unspecified cerebral artery: Secondary | ICD-10-CM | POA: Diagnosis not present

## 2012-08-09 DIAGNOSIS — I639 Cerebral infarction, unspecified: Secondary | ICD-10-CM

## 2012-08-09 DIAGNOSIS — N289 Disorder of kidney and ureter, unspecified: Secondary | ICD-10-CM

## 2012-08-09 DIAGNOSIS — D649 Anemia, unspecified: Secondary | ICD-10-CM | POA: Diagnosis not present

## 2012-08-09 DIAGNOSIS — I10 Essential (primary) hypertension: Secondary | ICD-10-CM

## 2012-08-09 LAB — BASIC METABOLIC PANEL
CO2: 29 mEq/L (ref 19–32)
Calcium: 9.6 mg/dL (ref 8.4–10.5)
Creatinine, Ser: 1.2 mg/dL (ref 0.4–1.2)

## 2012-08-09 LAB — CBC WITH DIFFERENTIAL/PLATELET
Basophils Relative: 0.4 % (ref 0.0–3.0)
Hemoglobin: 11.6 g/dL — ABNORMAL LOW (ref 12.0–15.0)
Lymphocytes Relative: 30.2 % (ref 12.0–46.0)
MCHC: 33.7 g/dL (ref 30.0–36.0)
Monocytes Relative: 6.3 % (ref 3.0–12.0)
Neutro Abs: 4.1 10*3/uL (ref 1.4–7.7)
RBC: 3.54 Mil/uL — ABNORMAL LOW (ref 3.87–5.11)

## 2012-08-09 LAB — HEPATIC FUNCTION PANEL
Albumin: 4.1 g/dL (ref 3.5–5.2)
Bilirubin, Direct: 0 mg/dL (ref 0.0–0.3)
Total Protein: 6.6 g/dL (ref 6.0–8.3)

## 2012-08-09 NOTE — Patient Instructions (Signed)
Align - one per day 

## 2012-08-12 ENCOUNTER — Telehealth: Payer: Self-pay | Admitting: Internal Medicine

## 2012-08-12 ENCOUNTER — Encounter: Payer: Self-pay | Admitting: Internal Medicine

## 2012-08-12 NOTE — Assessment & Plan Note (Signed)
Blood pressure under good control.  Same medications.  Follow metabolic panel.    

## 2012-08-12 NOTE — Assessment & Plan Note (Signed)
Last Cr stable - 1.3.  Renal ultrasound 04/20/11 - negative.  U/a did not reveal blood or protein.  Follow.

## 2012-08-12 NOTE — Progress Notes (Signed)
Subjective:    Patient ID: Rachel Martinez, female    DOB: 1927/10/02, 79 y.o.   MRN: [id number]  HPI 79 year old female with past history of hypertension, hypercholesterolemia and CVA who comes in today for a scheduled follow up.  She states she is doing relatively well.  Feels things are stable.  Eating and drinking well.  Breathing stable. Bowels stable.  Blood pressure has been doing well.  Feels she is handling stress relatively well.    Past Medical History  Diagnosis Date  . Heart palpitations   . Hypercholesteremia   . Hypertension   . CVA (cerebral vascular accident) 8/06  . Chicken pox   . Stroke     Current Outpatient Prescriptions on File Prior to Visit  Medication Sig Dispense Refill  . calcium carbonate (TUMS - DOSED IN MG ELEMENTAL CALCIUM) 500 MG chewable tablet Chew 1 tablet by mouth 3 (three) times daily.      Marland Kitchen diltiazem (TIAZAC) 120 MG 24 hr capsule Take 1 capsule (120 mg total) by mouth daily.  30 capsule  5  . dipyridamole-aspirin (AGGRENOX) 200-25 MG per 12 hr capsule Take 1 capsule by mouth 2 (two) times daily.  60 capsule  5  . fish oil-omega-3 fatty acids 1000 MG capsule Take 1 g by mouth 2 (two) times daily.       Marland Kitchen FLUoxetine (PROZAC) 10 MG capsule Take 10 mg by mouth daily.      Marland Kitchen lisinopril (PRINIVIL,ZESTRIL) 10 MG tablet Take 1 tablet (10 mg total) by mouth daily.  30 tablet  5  . Multiple Vitamin (MULTIVITAMIN) capsule Take 1 capsule by mouth daily.      Marland Kitchen oxybutynin (DITROPAN) 5 MG tablet Take 5 mg by mouth daily.      . pantoprazole (PROTONIX) 40 MG tablet Take 40 mg by mouth daily.      . pravastatin (PRAVACHOL) 40 MG tablet Take 40 mg by mouth daily.       No current facility-administered medications on file prior to visit.    Review of Systems Patient denies any headache, lightheadedness or dizziness.  No significant sinus or allergy symptoms.  No chest pain, tightness or palpitations.  No increased shortness of breath, cough or congestion.  No  nausea or vomiting.  No abdominal pain or cramping.  No bowel change, such as diarrhea, constipation, BRBPR or melana.  No urine change.  Feels she is handling stress relatively well.        Objective:   Physical Exam  Filed Vitals:   08/09/12 1356  BP: 130/70  Pulse: 56  Temp: 98.5 F (36.9 C)   Blood pressure recheck:  134/60, pulse 40  79 year old female in no acute distress.   HEENT:  Nares- clear.  Oropharynx - without lesions. NECK:  Supple.  Nontender.    HEART:  Appears to be regular. LUNGS:  No crackles or wheezing audible.  Respirations even and unlabored.  RADIAL PULSE:  Equal bilaterally.   ABDOMEN:  Soft, nontender.  Bowel sounds present and normal.  No audible abdominal bruit.  EXTREMITIES:  No increased edema present.  DP pulses palpable and equal bilaterally.          Assessment & Plan:  MSK. Better after therapy.  Continue home stretches and exercises.  Follow.   RIGHT CAROTID BRUIT.  Was supposed to have been scheduled for a carotid ultrasound.  Obtain results.     PREVIOUS WEIGHT LOSS.  Weight has been relatively  stable.  Had GI w/up.  Unable to perform EGD.  Colonoscopy performed (02/04/11 - diverticulosis/redundant colon).  Had UGI that revealed a hiatal hernia and reflux.  On protonix.  States she is eating and drinking well.  Follow.  Currently asymptomatic.  Follow.    INCREASED PSYCHOSOCIAL STRESSORS.  Currently stable.  Follow.    HEALTH MAINTENANCE.  GI w/up as outlined. Physical last visit.  She declined pelvic exam.  Mammogram 04/26/12 - Birads II.

## 2012-08-12 NOTE — Assessment & Plan Note (Signed)
On Aggrenox and doing well.  Follow.

## 2012-08-12 NOTE — Assessment & Plan Note (Signed)
On pravastatin.  Follow lipid profile and liver panel.  Will check liver panel today.  She has eaten.  Will need to check cholesterol at another lab check.

## 2012-08-12 NOTE — Assessment & Plan Note (Signed)
Had GI w/up as outlined.  Last hgb per her report - stable.  Follow.  Recheck cbc today.

## 2012-08-12 NOTE — Telephone Encounter (Signed)
Pt was referred to [facility] for carotid ultrasound.  Please call them and them and get them to send copy of last carotid ultrasound.  Thanks.

## 2012-08-13 NOTE — Telephone Encounter (Signed)
Carotid US requested

## 2012-08-15 NOTE — Telephone Encounter (Signed)
Have you seen the Carotid U/S report? I do not remember if I saw it in your folder. If not, I will request it again.

## 2012-08-16 NOTE — Telephone Encounter (Signed)
refaxed 2nd request

## 2012-08-16 NOTE — Telephone Encounter (Signed)
Spoke with Selena Batten at [facility]-pt was a no show for the Carotid Ultrasound

## 2012-08-16 NOTE — Telephone Encounter (Signed)
I could not find the report in my folder.  Thanks.

## 2012-08-20 ENCOUNTER — Other Ambulatory Visit: Payer: Self-pay | Admitting: *Deleted

## 2012-08-20 MED ORDER — PRAVASTATIN SODIUM 40 MG PO TABS: 40.0000 mg | ORAL_TABLET | Freq: Every day | ORAL | Status: DC

## 2012-09-20 DIAGNOSIS — Z23 Encounter for immunization: Secondary | ICD-10-CM | POA: Diagnosis not present

## 2012-12-10 ENCOUNTER — Ambulatory Visit: Payer: Medicare Other | Admitting: Internal Medicine

## 2012-12-13 ENCOUNTER — Encounter: Payer: Self-pay | Admitting: Internal Medicine

## 2012-12-13 ENCOUNTER — Ambulatory Visit (INDEPENDENT_AMBULATORY_CARE_PROVIDER_SITE_OTHER): Payer: BLUE CROSS/BLUE SHIELD | Admitting: Internal Medicine

## 2012-12-13 VITALS — BP 110/70 | HR 66 | Temp 98.3°F | Ht 62.5 in | Wt 127.5 lb

## 2012-12-13 DIAGNOSIS — I639 Cerebral infarction, unspecified: Secondary | ICD-10-CM

## 2012-12-13 DIAGNOSIS — E7800 Pure hypercholesterolemia, unspecified: Secondary | ICD-10-CM

## 2012-12-13 DIAGNOSIS — Z733 Stress, not elsewhere classified: Secondary | ICD-10-CM | POA: Diagnosis not present

## 2012-12-13 DIAGNOSIS — I10 Essential (primary) hypertension: Secondary | ICD-10-CM | POA: Diagnosis not present

## 2012-12-13 DIAGNOSIS — M25511 Pain in right shoulder: Secondary | ICD-10-CM

## 2012-12-13 DIAGNOSIS — M25519 Pain in unspecified shoulder: Secondary | ICD-10-CM | POA: Diagnosis not present

## 2012-12-13 DIAGNOSIS — D649 Anemia, unspecified: Secondary | ICD-10-CM | POA: Diagnosis not present

## 2012-12-13 DIAGNOSIS — N289 Disorder of kidney and ureter, unspecified: Secondary | ICD-10-CM

## 2012-12-13 DIAGNOSIS — I6350 Cerebral infarction due to unspecified occlusion or stenosis of unspecified cerebral artery: Secondary | ICD-10-CM | POA: Diagnosis not present

## 2012-12-13 DIAGNOSIS — F439 Reaction to severe stress, unspecified: Secondary | ICD-10-CM

## 2012-12-13 MED ORDER — SERTRALINE HCL 25 MG PO TABS: 25.0000 mg | ORAL_TABLET | Freq: Every day | ORAL | Status: DC

## 2012-12-13 NOTE — Progress Notes (Signed)
Pre-visit discussion using our clinic review tool. No additional management support is needed unless otherwise documented below in the visit note.  

## 2012-12-13 NOTE — Progress Notes (Signed)
Subjective:    Patient ID: Rachel Martinez, female    DOB: [birth year], 79 y.o.   MRN: [id number]  HPI 79 year old female with past history of hypertension, hypercholesterolemia and CVA who comes in today for a scheduled follow up.  She states she is doing relatively well.  Feels things are stable.  Eating and drinking well.  Breathing stable. Bowels stable.  Blood pressure has been doing well.  Increased stress.  We discussed this at length.  She reports she has not been taking anything for the stress.  Is agreeable to start something.  Feels she needs something to help level things off.  Overall doing relatively well.  She is having some right shoulder pain.  Right greater than left.  Has been taking tylenol arthritis.  She desires no further w/up at this time.     Past Medical History  Diagnosis Date  . Heart palpitations   . Hypercholesteremia   . Hypertension   . CVA (cerebral vascular accident) 8/06  . Chicken pox   . Stroke     Current Outpatient Prescriptions on File Prior to Visit  Medication Sig Dispense Refill  . calcium carbonate (TUMS - DOSED IN MG ELEMENTAL CALCIUM) 500 MG chewable tablet Chew 1 tablet by mouth 3 (three) times daily.      Marland Kitchen diltiazem (TIAZAC) 120 MG 24 hr capsule Take 1 capsule (120 mg total) by mouth daily.  30 capsule  5  . dipyridamole-aspirin (AGGRENOX) 200-25 MG per 12 hr capsule Take 1 capsule by mouth 2 (two) times daily.  60 capsule  5  . fish oil-omega-3 fatty acids 1000 MG capsule Take 1 g by mouth 2 (two) times daily.       Marland Kitchen FLUoxetine (PROZAC) 10 MG capsule Take 10 mg by mouth daily.      Marland Kitchen lisinopril (PRINIVIL,ZESTRIL) 10 MG tablet Take 1 tablet (10 mg total) by mouth daily.  30 tablet  5  . Multiple Vitamin (MULTIVITAMIN) capsule Take 1 capsule by mouth daily.      Marland Kitchen oxybutynin (DITROPAN) 5 MG tablet Take 5 mg by mouth daily.      . pantoprazole (PROTONIX) 40 MG tablet Take 40 mg by mouth daily.      . pravastatin (PRAVACHOL) 40 MG tablet Take  1 tablet (40 mg total) by mouth daily.  30 tablet  5   No current facility-administered medications on file prior to visit.    Review of Systems Patient denies any headache, lightheadedness or dizziness.  No significant sinus or allergy symptoms.  No chest pain, tightness or palpitations.  No increased shortness of breath, cough or congestion.  No nausea or vomiting.  No abdominal pain or cramping.  No bowel change, such as diarrhea, constipation, BRBPR or melana.  No urine change.  Feels she is handling stress relatively well.  Does feel she needs something to help level things off.  Right shoulder pain as outlined.        Objective:   Physical Exam  Filed Vitals:   12/13/12 1430  BP: 110/70  Pulse: 66  Temp: 98.3 F (36.8 C)   Blood pressure recheck:  41/82  79 year old female in no acute distress.   HEENT:  Nares- clear.  Oropharynx - without lesions. NECK:  Supple.  Nontender.    HEART:  Appears to be regular. LUNGS:  No crackles or wheezing audible.  Respirations even and unlabored.  RADIAL PULSE:  Equal bilaterally.   ABDOMEN:  Soft,  nontender.  Bowel sounds present and normal.  No audible abdominal bruit.  EXTREMITIES:  No increased edema present.  DP pulses palpable and equal bilaterally.          Assessment & Plan:  RIGHT CAROTID BRUIT.  Was supposed to have been scheduled for a carotid ultrasound.  States never had.  Schedule.      PREVIOUS WEIGHT LOSS.  Weight down several pounds from last visit.  Had GI w/up.  Unable to perform EGD.  Colonoscopy performed (02/04/11 - diverticulosis/redundant colon).  Had UGI that revealed a hiatal hernia and reflux.  On protonix.  States she is eating and drinking well.  Follow.  Currently asymptomatic.  Treat the stress.    HEALTH MAINTENANCE.  GI w/up as outlined. Physical 04/09/12.  She declined pelvic exam.  Mammogram 04/26/12 - Birads II.

## 2012-12-13 NOTE — Patient Instructions (Signed)
See if you are on Prozac (fluoxetine).  If so, let me know.  If you are not on this medication, then start zoloft - one per day.

## 2012-12-16 ENCOUNTER — Encounter: Payer: Self-pay | Admitting: Internal Medicine

## 2012-12-16 DIAGNOSIS — M25511 Pain in right shoulder: Secondary | ICD-10-CM | POA: Insufficient documentation

## 2012-12-16 DIAGNOSIS — F439 Reaction to severe stress, unspecified: Secondary | ICD-10-CM | POA: Insufficient documentation

## 2012-12-16 NOTE — Assessment & Plan Note (Signed)
Last Cr stable - 1.3.  Renal ultrasound 04/20/11 - negative.  U/a did not reveal blood or protein.  Follow.

## 2012-12-16 NOTE — Assessment & Plan Note (Signed)
Taking tylenol arthritis.  Helps some.  Desires no further w/up at this time.  Follow.

## 2012-12-16 NOTE — Assessment & Plan Note (Signed)
On pravastatin.  Follow lipid profile and liver panel.    

## 2012-12-16 NOTE — Assessment & Plan Note (Signed)
On Aggrenox and doing well.  Follow.

## 2012-12-16 NOTE — Assessment & Plan Note (Signed)
Blood pressure under good control.  Same medications.  Follow metabolic panel.    

## 2012-12-16 NOTE — Assessment & Plan Note (Addendum)
Increased stress.  Dicussed at length with her.  Will start SSRI daily.  Follow.  Get her back in soon to reassess.

## 2012-12-16 NOTE — Assessment & Plan Note (Signed)
Had GI w/up as outlined.  Last hgb per her report - stable.  Follow cbc.    

## 2012-12-27 ENCOUNTER — Other Ambulatory Visit: Payer: Self-pay | Admitting: *Deleted

## 2012-12-27 MED ORDER — ASPIRIN-DIPYRIDAMOLE ER 25-200 MG PO CP12: 1.0000 | ORAL_CAPSULE | Freq: Two times a day (BID) | ORAL | Status: DC

## 2013-02-01 ENCOUNTER — Encounter: Payer: Self-pay | Admitting: Internal Medicine

## 2013-02-01 ENCOUNTER — Ambulatory Visit (INDEPENDENT_AMBULATORY_CARE_PROVIDER_SITE_OTHER): Payer: Medicare Other | Admitting: Internal Medicine

## 2013-02-01 VITALS — BP 118/60 | HR 54 | Resp 12 | Ht 62.5 in | Wt 126.0 lb

## 2013-02-01 DIAGNOSIS — I10 Essential (primary) hypertension: Secondary | ICD-10-CM

## 2013-02-01 DIAGNOSIS — D649 Anemia, unspecified: Secondary | ICD-10-CM | POA: Diagnosis not present

## 2013-02-01 DIAGNOSIS — F430 Acute stress reaction: Secondary | ICD-10-CM | POA: Diagnosis not present

## 2013-02-01 DIAGNOSIS — E7800 Pure hypercholesterolemia, unspecified: Secondary | ICD-10-CM

## 2013-02-01 DIAGNOSIS — I6350 Cerebral infarction due to unspecified occlusion or stenosis of unspecified cerebral artery: Secondary | ICD-10-CM

## 2013-02-01 DIAGNOSIS — I639 Cerebral infarction, unspecified: Secondary | ICD-10-CM

## 2013-02-01 DIAGNOSIS — N289 Disorder of kidney and ureter, unspecified: Secondary | ICD-10-CM

## 2013-02-01 DIAGNOSIS — F439 Reaction to severe stress, unspecified: Secondary | ICD-10-CM

## 2013-02-01 LAB — CBC WITH DIFFERENTIAL/PLATELET
Basophils Absolute: 0 10*3/uL (ref 0.0–0.1)
Basophils Relative: 0.4 % (ref 0.0–3.0)
EOS PCT: 0.6 % (ref 0.0–5.0)
Eosinophils Absolute: 0 10*3/uL (ref 0.0–0.7)
HCT: 34.1 % — ABNORMAL LOW (ref 36.0–46.0)
Hemoglobin: 11.6 g/dL — ABNORMAL LOW (ref 12.0–15.0)
Lymphocytes Relative: 32.6 % (ref 12.0–46.0)
Lymphs Abs: 1.6 10*3/uL (ref 0.7–4.0)
MCHC: 34 g/dL (ref 30.0–36.0)
MCV: 95 fl (ref 78.0–100.0)
MONO ABS: 0.3 10*3/uL (ref 0.1–1.0)
MONOS PCT: 5.5 % (ref 3.0–12.0)
NEUTROS PCT: 60.9 % (ref 43.0–77.0)
Neutro Abs: 3 10*3/uL (ref 1.4–7.7)
PLATELETS: 177 10*3/uL (ref 150.0–400.0)
RBC: 3.59 Mil/uL — ABNORMAL LOW (ref 3.87–5.11)
RDW: 13.7 % (ref 11.5–14.6)
WBC: 5 10*3/uL (ref 4.5–10.5)

## 2013-02-01 LAB — FERRITIN: FERRITIN: 24.1 ng/mL (ref 10.0–291.0)

## 2013-02-01 LAB — BASIC METABOLIC PANEL
BUN: 23 mg/dL (ref 6–23)
CALCIUM: 9.7 mg/dL (ref 8.4–10.5)
CO2: 28 meq/L (ref 19–32)
CREATININE: 1.3 mg/dL — AB (ref 0.4–1.2)
Chloride: 111 mEq/L (ref 96–112)
GFR: 42.24 mL/min — ABNORMAL LOW (ref >60.00)
Glucose, Bld: 78 mg/dL (ref 70–99)
Potassium: 4.5 mEq/L (ref 3.5–5.1)
Sodium: 145 mEq/L (ref 135–145)

## 2013-02-01 LAB — HEPATIC FUNCTION PANEL
ALK PHOS: 52 U/L (ref 39–117)
ALT: 13 U/L (ref 0–35)
AST: 26 U/L (ref 0–37)
Albumin: 4.1 g/dL (ref 3.5–5.2)
BILIRUBIN DIRECT: 0.1 mg/dL (ref 0.0–0.3)
BILIRUBIN TOTAL: 0.5 mg/dL (ref 0.3–1.2)
TOTAL PROTEIN: 6.4 g/dL (ref 6.0–8.3)

## 2013-02-01 MED ORDER — SERTRALINE HCL 50 MG PO TABS: 50.0000 mg | ORAL_TABLET | Freq: Every day | ORAL | Status: DC

## 2013-02-01 MED ORDER — CLOPIDOGREL BISULFATE 75 MG PO TABS: 75.0000 mg | ORAL_TABLET | Freq: Every day | ORAL | Status: DC

## 2013-02-01 NOTE — Progress Notes (Signed)
Pre visit review using our clinic review tool, if applicable. No additional management support is needed unless otherwise documented below in the visit note. 

## 2013-02-01 NOTE — Assessment & Plan Note (Addendum)
Increased stress.  Better now. Feels she may need to increase the dose.  Increased zoloft to 50mg  q day.  Follow.

## 2013-02-01 NOTE — Assessment & Plan Note (Addendum)
On Aggrenox and doing well.  Wants to change to plavix.  Cannot afford the aggrenox.  Will change to plavix.  Follow.

## 2013-02-01 NOTE — Progress Notes (Signed)
Subjective:    Patient ID: Rachel Martinez, female    DOB: 1927/09/28, 80 y.o.   MRN: [id number]  HPI 80 year old female with past history of hypertension, hypercholesterolemia and CVA who comes in today for a scheduled follow up.  She states she is doing relatively well.  Feels things are stable.  Eating and drinking well.  Breathing stable. Bowels stable.  Blood pressure has been doing well.  Increased stress.  Started on zoloft last visit.  States she is doing better.  Feels better.  Feels she may need to increase the dose some.  She states that she cannot afford the aggrenox.  Wants to change to plavix.       Past Medical History  Diagnosis Date  . Heart palpitations   . Hypercholesteremia   . Hypertension   . CVA (cerebral vascular accident) 8/06  . Chicken pox   . Stroke     Current Outpatient Prescriptions on File Prior to Visit  Medication Sig Dispense Refill  . calcium carbonate (TUMS - DOSED IN MG ELEMENTAL CALCIUM) 500 MG chewable tablet Chew 1 tablet by mouth 3 (three) times daily.      Marland Kitchen diltiazem (TIAZAC) 120 MG 24 hr capsule Take 1 capsule (120 mg total) by mouth daily.  30 capsule  5  . dipyridamole-aspirin (AGGRENOX) 200-25 MG per 12 hr capsule Take 1 capsule by mouth 2 (two) times daily.  60 capsule  5  . lisinopril (PRINIVIL,ZESTRIL) 10 MG tablet Take 1 tablet (10 mg total) by mouth daily.  30 tablet  5  . Multiple Vitamin (MULTIVITAMIN) capsule Take 1 capsule by mouth daily.      . pravastatin (PRAVACHOL) 40 MG tablet Take 1 tablet (40 mg total) by mouth daily.  30 tablet  5  . sertraline (ZOLOFT) 25 MG tablet Take 1 tablet (25 mg total) by mouth daily.  30 tablet  1   No current facility-administered medications on file prior to visit.    Review of Systems Patient denies any headache, lightheadedness or dizziness.  No significant sinus or allergy symptoms.  No chest pain, tightness or palpitations.  No increased shortness of breath, cough or congestion.  No nausea  or vomiting.  No abdominal pain or cramping.  No bowel change, such as diarrhea, constipation, BRBPR or melana.  No urine change.  Feels she is handling stress relatively well.  Does feel she needs something to help level things off.  Right shoulder pain as outlined.        Objective:   Physical Exam  Filed Vitals:   02/01/13 1121  BP: 118/60  Pulse: 54  Resp: 12   Blood pressure recheck:  43/24  80 year old female in no acute distress.   HEENT:  Nares- clear.  Oropharynx - without lesions. NECK:  Supple.  Nontender.    HEART:  Appears to be regular. LUNGS:  No crackles or wheezing audible.  Respirations even and unlabored.  RADIAL PULSE:  Equal bilaterally.   ABDOMEN:  Soft, nontender.  Bowel sounds present and normal.  No audible abdominal bruit.  EXTREMITIES:  No increased edema present.  DP pulses palpable and equal bilaterally.          Assessment & Plan:  RIGHT CAROTID BRUIT.  Was scheduled for a carotid ultrasound.  No show for appt.  Follow.         PREVIOUS WEIGHT LOSS.  Weight down 1.5 pounds from last check.   Had GI w/up.  Unable to perform EGD.  Colonoscopy performed (02/04/11 - diverticulosis/redundant colon).  Had UGI that revealed a hiatal hernia and reflux.  On protonix.  States she is eating and drinking well.  Follow.  Currently asymptomatic.  Treat the stress.    HEALTH MAINTENANCE.  GI w/up as outlined. Physical 04/09/12.  She declined pelvic exam.  Mammogram 04/26/12 - Birads II.

## 2013-02-01 NOTE — Assessment & Plan Note (Addendum)
Blood pressure under good control.  Same medications.  Follow metabolic panel.

## 2013-02-01 NOTE — Patient Instructions (Signed)
I am going to increase your zoloft (sertraline) to 50mg  one per day.  (you have 25mg  tablets now.  Take two of these at the same time per day until you run out).    When you finish your current prescription for aggrenox, then start plavix (clopidegrol) one per day.

## 2013-02-01 NOTE — Assessment & Plan Note (Addendum)
Had GI w/up as outlined.  Last hgb per her report - stable.  Follow cbc.

## 2013-02-04 ENCOUNTER — Encounter: Payer: Self-pay | Admitting: *Deleted

## 2013-02-04 ENCOUNTER — Encounter: Payer: Self-pay | Admitting: Internal Medicine

## 2013-02-04 NOTE — Assessment & Plan Note (Addendum)
Last Cr stable - 1.2.  Renal ultrasound 04/20/11 - negative.  U/a did not reveal blood or protein.  Follow.

## 2013-02-04 NOTE — Assessment & Plan Note (Signed)
On pravastatin.  Follow lipid profile and liver panel.    

## 2013-04-09 ENCOUNTER — Encounter: Payer: Self-pay | Admitting: Internal Medicine

## 2013-04-09 ENCOUNTER — Ambulatory Visit (INDEPENDENT_AMBULATORY_CARE_PROVIDER_SITE_OTHER): Payer: BLUE CROSS/BLUE SHIELD | Admitting: Internal Medicine

## 2013-04-09 VITALS — BP 110/50 | HR 59 | Temp 98.3°F | Ht 62.0 in | Wt 124.8 lb

## 2013-04-09 DIAGNOSIS — R634 Abnormal weight loss: Secondary | ICD-10-CM

## 2013-04-09 DIAGNOSIS — Z1239 Encounter for other screening for malignant neoplasm of breast: Secondary | ICD-10-CM

## 2013-04-09 DIAGNOSIS — I639 Cerebral infarction, unspecified: Secondary | ICD-10-CM

## 2013-04-09 DIAGNOSIS — I10 Essential (primary) hypertension: Secondary | ICD-10-CM

## 2013-04-09 DIAGNOSIS — E7800 Pure hypercholesterolemia, unspecified: Secondary | ICD-10-CM | POA: Diagnosis not present

## 2013-04-09 DIAGNOSIS — D649 Anemia, unspecified: Secondary | ICD-10-CM

## 2013-04-09 DIAGNOSIS — I6350 Cerebral infarction due to unspecified occlusion or stenosis of unspecified cerebral artery: Secondary | ICD-10-CM | POA: Diagnosis not present

## 2013-04-09 DIAGNOSIS — R0989 Other specified symptoms and signs involving the circulatory and respiratory systems: Secondary | ICD-10-CM

## 2013-04-09 DIAGNOSIS — N289 Disorder of kidney and ureter, unspecified: Secondary | ICD-10-CM | POA: Diagnosis not present

## 2013-04-09 DIAGNOSIS — Z733 Stress, not elsewhere classified: Secondary | ICD-10-CM

## 2013-04-09 DIAGNOSIS — F439 Reaction to severe stress, unspecified: Secondary | ICD-10-CM

## 2013-04-09 NOTE — Progress Notes (Signed)
Pre-visit discussion using our clinic review tool. No additional management support is needed unless otherwise documented below in the visit note.  

## 2013-04-09 NOTE — Progress Notes (Signed)
Subjective:    Patient ID: Rachel Martinez, female    DOB: 11/18/1927, 80 y.o.   MRN: [id number]  HPI 80 year old female with past history of hypertension, hypercholesterolemia and CVA who comes in today for a scheduled follow up.   She states she is doing relatively well.  Feels things overall are stable.  Eating and drinking well.  She has lost weight.  States she is cooking different.  No sweets.  Breathing stable.  Bowels stable.  Blood pressure has been doing well.  Increased stress.  Started on zolof.  States she is doing better.  Feels better.  Had some runny nose and drainage last week.  Better.       Past Medical History  Diagnosis Date  . Heart palpitations   . Hypercholesteremia   . Hypertension   . CVA (cerebral vascular accident) 8/06  . Chicken pox   . Stroke     Current Outpatient Prescriptions on File Prior to Visit  Medication Sig Dispense Refill  . calcium carbonate (TUMS - DOSED IN MG ELEMENTAL CALCIUM) 500 MG chewable tablet Chew 1 tablet by mouth 3 (three) times daily.      . clopidogrel (PLAVIX) 75 MG tablet Take 1 tablet (75 mg total) by mouth daily.  30 tablet  3  . diltiazem (TIAZAC) 120 MG 24 hr capsule Take 1 capsule (120 mg total) by mouth daily.  30 capsule  5  . lisinopril (PRINIVIL,ZESTRIL) 10 MG tablet Take 1 tablet (10 mg total) by mouth daily.  30 tablet  5  . Multiple Vitamin (MULTIVITAMIN) capsule Take 1 capsule by mouth daily.      . pravastatin (PRAVACHOL) 40 MG tablet Take 1 tablet (40 mg total) by mouth daily.  30 tablet  5   No current facility-administered medications on file prior to visit.    Review of Systems Patient denies any headache, lightheadedness or dizziness.  No significant sinus or allergy symptoms now.  Better.  No chest pain, tightness or palpitations.  No increased shortness of breath, cough or congestion.  No nausea or vomiting.  No abdominal pain or cramping.  No bowel change, such as diarrhea, constipation, BRBPR or melana.   No urine change.  Feels she is handling stress relatively well.       Objective:   Physical Exam  Filed Vitals:   04/09/13 1317  BP: 110/50  Pulse: 59  Temp: 98.3 F (36.8 C)   Blood pressure recheck:  73/19  80 year old female in no acute distress.   HEENT:  Nares- clear.  Oropharynx - without lesions. NECK:  Supple.  Nontender.  No audible bruit.  HEART:  Appears to be regular. LUNGS:  No crackles or wheezing audible.  Respirations even and unlabored.  RADIAL PULSE:  Equal bilaterally.  ABDOMEN:  Soft, nontender.  Bowel sounds present and normal.  No audible abdominal bruit.    EXTREMITIES:  No increased edema present.  DP pulses palpable and equal bilaterally.          Assessment & Plan:  RIGHT CAROTID BRUIT.  Was scheduled for a carotid ultrasound.  Has not had performed.  Will reschedule.         PREVIOUS WEIGHT LOSS.  Weight down from last check.   Had GI w/up.  Unable to perform EGD.  Colonoscopy performed (02/04/11 - diverticulosis/redundant colon).  Had UGI that revealed a hiatal hernia and reflux.  On protonix.  States she is eating and drinking well.  Follow.  Currently asymptomatic.  Treating the stress.  States she has changed the way she cooks secondary to her husband's health issues.  Has cut out sweets.    HEALTH MAINTENANCE.  GI w/up as outlined. Physical 04/09/12.  She declined pelvic exam.  Mammogram 04/26/12 - Birads II.

## 2013-04-11 ENCOUNTER — Other Ambulatory Visit: Payer: Self-pay | Admitting: *Deleted

## 2013-04-11 MED ORDER — DILTIAZEM HCL ER BEADS 120 MG PO CP24: 120.0000 mg | ORAL_CAPSULE | Freq: Every day | ORAL | Status: DC

## 2013-04-11 MED ORDER — PRAVASTATIN SODIUM 40 MG PO TABS: 40.0000 mg | ORAL_TABLET | Freq: Every day | ORAL | Status: DC

## 2013-04-14 ENCOUNTER — Encounter: Payer: Self-pay | Admitting: Internal Medicine

## 2013-04-14 NOTE — Assessment & Plan Note (Signed)
On pravastatin.  Follow lipid profile and liver panel.    

## 2013-04-14 NOTE — Assessment & Plan Note (Signed)
Had GI w/up as outlined.  Last hgb per her report - stable.  Follow cbc.

## 2013-04-14 NOTE — Assessment & Plan Note (Signed)
Was changed to plavix last visit. See last note.  Follow.

## 2013-04-14 NOTE — Assessment & Plan Note (Signed)
Blood pressure under good control.  Same medications.  Follow metabolic panel.

## 2013-04-14 NOTE — Assessment & Plan Note (Signed)
Increased stress.  Better now.  On zoloft 50mg q day.  Doing better.  Follow.      

## 2013-04-14 NOTE — Assessment & Plan Note (Signed)
Last Cr stable.  Renal ultrasound 04/20/11 - negative.  U/a did not reveal blood or protein.  Follow.

## 2013-04-22 DIAGNOSIS — I6529 Occlusion and stenosis of unspecified carotid artery: Secondary | ICD-10-CM | POA: Diagnosis not present

## 2013-04-23 ENCOUNTER — Encounter: Payer: Self-pay | Admitting: Emergency Medicine

## 2013-04-24 ENCOUNTER — Encounter: Payer: Medicare Other | Admitting: Internal Medicine

## 2013-05-01 ENCOUNTER — Encounter: Payer: Self-pay | Admitting: *Deleted

## 2013-05-08 ENCOUNTER — Other Ambulatory Visit (INDEPENDENT_AMBULATORY_CARE_PROVIDER_SITE_OTHER): Payer: BLUE CROSS/BLUE SHIELD

## 2013-05-08 DIAGNOSIS — R634 Abnormal weight loss: Secondary | ICD-10-CM

## 2013-05-08 DIAGNOSIS — I10 Essential (primary) hypertension: Secondary | ICD-10-CM | POA: Diagnosis not present

## 2013-05-08 DIAGNOSIS — E7800 Pure hypercholesterolemia, unspecified: Secondary | ICD-10-CM | POA: Diagnosis not present

## 2013-05-08 DIAGNOSIS — D649 Anemia, unspecified: Secondary | ICD-10-CM | POA: Diagnosis not present

## 2013-05-08 LAB — CBC WITH DIFFERENTIAL/PLATELET
Basophils Absolute: 0 10*3/uL (ref 0.0–0.1)
Basophils Relative: 0.6 % (ref 0.0–3.0)
EOS ABS: 0.1 10*3/uL (ref 0.0–0.7)
EOS PCT: 2.1 % (ref 0.0–5.0)
HCT: 24.5 % — ABNORMAL LOW (ref 36.0–46.0)
LYMPHS PCT: 36.3 % (ref 12.0–46.0)
Lymphs Abs: 1.9 10*3/uL (ref 0.7–4.0)
MCHC: 34.2 g/dL (ref 30.0–36.0)
MCV: 94.4 fl (ref 78.0–100.0)
MONOS PCT: 6.7 % (ref 3.0–12.0)
Monocytes Absolute: 0.3 10*3/uL (ref 0.1–1.0)
NEUTROS ABS: 2.8 10*3/uL (ref 1.4–7.7)
NEUTROS PCT: 54.3 % (ref 43.0–77.0)
Platelets: 240 10*3/uL (ref 150.0–400.0)
RDW: 14 % (ref 11.5–14.6)
WBC: 5.2 10*3/uL (ref 4.5–10.5)

## 2013-05-08 LAB — LIPID PANEL
CHOL/HDL RATIO: 3
Cholesterol: 162 mg/dL (ref 0–200)
HDL: 55.7 mg/dL (ref >39.00)
LDL CALC: 80 mg/dL (ref 0–99)
TRIGLYCERIDES: 134 mg/dL (ref 0.0–149.0)
VLDL: 26.8 mg/dL (ref 0.0–40.0)

## 2013-05-08 LAB — TSH: TSH: 1.08 u[IU]/mL (ref 0.35–5.50)

## 2013-05-08 LAB — BASIC METABOLIC PANEL
BUN: 21 mg/dL (ref 6–23)
CALCIUM: 9.2 mg/dL (ref 8.4–10.5)
CO2: 28 mEq/L (ref 19–32)
CREATININE: 1.4 mg/dL — AB (ref 0.4–1.2)
Chloride: 107 mEq/L (ref 96–112)
GFR: 39.02 mL/min — ABNORMAL LOW (ref >60.00)
Glucose, Bld: 92 mg/dL (ref 70–99)
Potassium: 4.3 mEq/L (ref 3.5–5.1)
Sodium: 140 mEq/L (ref 135–145)

## 2013-05-08 LAB — HEPATIC FUNCTION PANEL
ALK PHOS: 53 U/L (ref 39–117)
ALT: 13 U/L (ref 0–35)
AST: 26 U/L (ref 0–37)
Albumin: 3.6 g/dL (ref 3.5–5.2)
Bilirubin, Direct: 0.1 mg/dL (ref 0.0–0.3)
TOTAL PROTEIN: 5.8 g/dL — AB (ref 6.0–8.3)
Total Bilirubin: 0.8 mg/dL (ref 0.3–1.2)

## 2013-05-08 LAB — FERRITIN: FERRITIN: 21.6 ng/mL (ref 10.0–291.0)

## 2013-05-09 ENCOUNTER — Ambulatory Visit (INDEPENDENT_AMBULATORY_CARE_PROVIDER_SITE_OTHER): Payer: BLUE CROSS/BLUE SHIELD | Admitting: Internal Medicine

## 2013-05-09 ENCOUNTER — Encounter: Payer: Self-pay | Admitting: Internal Medicine

## 2013-05-09 VITALS — BP 130/50 | HR 84 | Temp 98.0°F | Ht 62.0 in | Wt 122.0 lb

## 2013-05-09 DIAGNOSIS — N289 Disorder of kidney and ureter, unspecified: Secondary | ICD-10-CM | POA: Diagnosis not present

## 2013-05-09 DIAGNOSIS — D649 Anemia, unspecified: Secondary | ICD-10-CM | POA: Diagnosis not present

## 2013-05-09 DIAGNOSIS — N63 Unspecified lump in unspecified breast: Secondary | ICD-10-CM

## 2013-05-09 DIAGNOSIS — R195 Other fecal abnormalities: Secondary | ICD-10-CM

## 2013-05-09 DIAGNOSIS — R928 Other abnormal and inconclusive findings on diagnostic imaging of breast: Secondary | ICD-10-CM

## 2013-05-09 DIAGNOSIS — E7800 Pure hypercholesterolemia, unspecified: Secondary | ICD-10-CM | POA: Diagnosis not present

## 2013-05-09 DIAGNOSIS — I10 Essential (primary) hypertension: Secondary | ICD-10-CM | POA: Diagnosis not present

## 2013-05-09 DIAGNOSIS — R0989 Other specified symptoms and signs involving the circulatory and respiratory systems: Secondary | ICD-10-CM

## 2013-05-09 DIAGNOSIS — I6350 Cerebral infarction due to unspecified occlusion or stenosis of unspecified cerebral artery: Secondary | ICD-10-CM | POA: Diagnosis not present

## 2013-05-09 DIAGNOSIS — R42 Dizziness and giddiness: Secondary | ICD-10-CM

## 2013-05-09 DIAGNOSIS — I639 Cerebral infarction, unspecified: Secondary | ICD-10-CM

## 2013-05-09 LAB — LIPID PANEL
CHOLESTEROL: 174 mg/dL (ref 0–200)
HDL: 57.5 mg/dL (ref >39.00)
LDL Cholesterol: 89 mg/dL (ref 0–99)
Total CHOL/HDL Ratio: 3
Triglycerides: 140 mg/dL (ref 0.0–149.0)
VLDL: 28 mg/dL (ref 0.0–40.0)

## 2013-05-09 LAB — BASIC METABOLIC PANEL
BUN: 19 mg/dL (ref 6–23)
CHLORIDE: 104 meq/L (ref 96–112)
CO2: 28 meq/L (ref 19–32)
Calcium: 9.1 mg/dL (ref 8.4–10.5)
Creatinine, Ser: 1.4 mg/dL — ABNORMAL HIGH (ref 0.4–1.2)
GFR: 38.38 mL/min — ABNORMAL LOW (ref >60.00)
Glucose, Bld: 95 mg/dL (ref 70–99)
POTASSIUM: 4.1 meq/L (ref 3.5–5.1)
SODIUM: 140 meq/L (ref 135–145)

## 2013-05-09 LAB — CBC WITH DIFFERENTIAL/PLATELET
BASOS PCT: 0.7 % (ref 0.0–3.0)
Basophils Absolute: 0 10*3/uL (ref 0.0–0.1)
Eosinophils Absolute: 0.1 10*3/uL (ref 0.0–0.7)
Eosinophils Relative: 1.6 % (ref 0.0–5.0)
HCT: 24.1 % — ABNORMAL LOW (ref 36.0–46.0)
LYMPHS ABS: 1.5 10*3/uL (ref 0.7–4.0)
Lymphocytes Relative: 31.2 % (ref 12.0–46.0)
MCHC: 34 g/dL (ref 30.0–36.0)
MCV: 94.3 fl (ref 78.0–100.0)
MONO ABS: 0.3 10*3/uL (ref 0.1–1.0)
Monocytes Relative: 6.9 % (ref 3.0–12.0)
NEUTROS ABS: 2.9 10*3/uL (ref 1.4–7.7)
NEUTROS PCT: 59.6 % (ref 43.0–77.0)
Platelets: 228 10*3/uL (ref 150.0–400.0)
RBC: 2.56 Mil/uL — AB (ref 3.87–5.11)
RDW: 14.4 % (ref 11.5–14.6)
WBC: 4.8 10*3/uL (ref 4.5–10.5)

## 2013-05-09 LAB — IBC PANEL
Iron: 44 ug/dL (ref 42–145)
SATURATION RATIOS: 12.3 % — AB (ref 20.0–50.0)
Transferrin: 256.5 mg/dL (ref 212.0–360.0)

## 2013-05-09 LAB — VITAMIN B12: VITAMIN B 12: 660 pg/mL (ref 211–911)

## 2013-05-09 NOTE — Patient Instructions (Signed)
Hold the lisinopril today.  Call me tomorrow with an update.

## 2013-05-09 NOTE — Progress Notes (Signed)
Pre visit review using our clinic review tool, if applicable. No additional management support is needed unless otherwise documented below in the visit note. 

## 2013-05-10 ENCOUNTER — Encounter: Payer: Self-pay | Admitting: Internal Medicine

## 2013-05-10 DIAGNOSIS — R928 Other abnormal and inconclusive findings on diagnostic imaging of breast: Secondary | ICD-10-CM | POA: Insufficient documentation

## 2013-05-10 DIAGNOSIS — R42 Dizziness and giddiness: Secondary | ICD-10-CM | POA: Insufficient documentation

## 2013-05-10 DIAGNOSIS — N63 Unspecified lump in unspecified breast: Secondary | ICD-10-CM | POA: Insufficient documentation

## 2013-05-10 DIAGNOSIS — D649 Anemia, unspecified: Secondary | ICD-10-CM | POA: Diagnosis not present

## 2013-05-10 DIAGNOSIS — R195 Other fecal abnormalities: Secondary | ICD-10-CM | POA: Insufficient documentation

## 2013-05-10 DIAGNOSIS — R0989 Other specified symptoms and signs involving the circulatory and respiratory systems: Secondary | ICD-10-CM | POA: Insufficient documentation

## 2013-05-10 DIAGNOSIS — R112 Nausea with vomiting, unspecified: Secondary | ICD-10-CM | POA: Diagnosis not present

## 2013-05-10 NOTE — Assessment & Plan Note (Signed)
Blood pressure as outlined.  No significant drop in blood pressure with standing (on exam today).  Will temporarily hold lisinopril.  Have her monitor her blood pressure.  As blood pressure (and symptoms) improve, will add back.  Recheck metabolic panel today.

## 2013-05-10 NOTE — Assessment & Plan Note (Signed)
Feel this is multifactorial.  She was sick last week.  Hgb decreased significantly.  Volume depleted.  Appetite is improving.  Stay hydrated.  Refer to GI for further w/up.  Recheck cbc today.  Hold lisinopril.  Follow blood pressure closely.  Add back when able.

## 2013-05-10 NOTE — Assessment & Plan Note (Signed)
Abdominal bruit audible on exam.  No abdominal pain or back pain.  Check aortic ultrasound.

## 2013-05-10 NOTE — Assessment & Plan Note (Signed)
GI w/up previously as outlined.  With decreased hgb (8.4 yesterday and 11.5 three months ago) and heme positive stool, will refer to GI for further evaluation and treatment.  See above.

## 2013-05-10 NOTE — Assessment & Plan Note (Signed)
Has a large hematoma on the left breast.  Also can palpate a nodule 3:00 position of left breast.  Schedule mammo and ultrasound to further evaluate.

## 2013-05-10 NOTE — Assessment & Plan Note (Signed)
Was changed to plavix recently.  With heme positive stool and hgb decreasing, will need to hold plavix temporarily until GI evaluation.  Needs to be on antiplatelet therapy.  Follow closely.

## 2013-05-10 NOTE — Progress Notes (Signed)
Subjective:    Patient ID: Rachel Martinez, female    DOB: 1927-06-23, 80 y.o.   MRN: [id number]  Dizziness  80 year old female with past history of hypertension, hypercholesterolemia and CVA who comes in today as a work in with concerns regarding feeling weak and light headed.  She was found on yesterday's labs to have a decreased hgb.  Called and spoke with her regarding her labs.  She informed me that last week she was having vomiting and diarrhea.  The vomiting and diarrhea have subsided.  She is eating some now.  Appetite is better.  Still not back to normal.  Trying to drink fluids.  No abdominal pain or cramping.  Bowels back to normal.  She reports no blood in her stool.  Did not notice blood with emesis.  She feels shaky.  Since she had the vomiting and diarrhea, she feels "swimmy headed" at times.  States after she is up for a while is when she notices.  When she lies flat, feels better.  No chest pain or tightness.  Does report with increased activity, her heart rate will increase.  She is also concerned about a bruise on her left breast.  denies any known injury or trauma.  No back pain or abdominal pain.  No syncope or near syncope.  Breathing stable.         Past Medical History  Diagnosis Date  . Heart palpitations   . Hypercholesteremia   . Hypertension   . CVA (cerebral vascular accident) 8/06  . Chicken pox   . Stroke     Current Outpatient Prescriptions on File Prior to Visit  Medication Sig Dispense Refill  . calcium carbonate (TUMS - DOSED IN MG ELEMENTAL CALCIUM) 500 MG chewable tablet Chew 1 tablet by mouth 3 (three) times daily as needed.       . clopidogrel (PLAVIX) 75 MG tablet Take 1 tablet (75 mg total) by mouth daily.  30 tablet  3  . diltiazem (TIAZAC) 120 MG 24 hr capsule Take 1 capsule (120 mg total) by mouth daily.  30 capsule  5  . lisinopril (PRINIVIL,ZESTRIL) 10 MG tablet Take 1 tablet (10 mg total) by mouth daily.  30 tablet  5  . Multiple Vitamin  (MULTIVITAMIN) capsule Take 1 capsule by mouth daily.      . pravastatin (PRAVACHOL) 40 MG tablet Take 1 tablet (40 mg total) by mouth daily.  30 tablet  0   No current facility-administered medications on file prior to visit.    Review of Systems  Neurological: Positive for dizziness.  Patient denies any headache.  Does report the dizziness and light headedness as outlined.   No significant sinus or allergy symptoms now.  Better.  No chest pain, tightness.  Some increased heart rate as outlined.  No increased shortness of breath, cough or congestion.  No nausea or vomiting now.  Is eating.  Appetite gradually improving.  No abdominal pain or cramping.  Previous diarrhea.  No blood.  Bowels back to her baseline.        Objective:   Physical Exam  Filed Vitals:   05/09/13 1207  BP: 130/50  Pulse: 84  Temp: 98 F (36.7 C)   Blood pressure recheck:  128/58, pulse 76 (standing) and 136/78 lying.   80 year old female in no acute distress.   HEENT:  Nares- clear.  Oropharynx - without lesions. NECK:  Supple.  Nontender.  No audible bruit.  HEART:  Appears to be regular. LUNGS:  No crackles or wheezing audible.  Respirations even and unlabored.  RADIAL PULSE:  Equal bilaterally.    BREASTS:  No nipple discharge or nipple retraction present.  Could not appreciate any axillary adenopathy.  Hematoma left lateral breast.  Palpable fullness approximately 3:00 left lateral breast.   ABDOMEN:  Soft, nontender.  Bowel sounds present and normal.  Abdominal bruit present.    RECTAL:  Heme positive.   EXTREMITIES:  No increased edema present.  DP pulses palpable and equal bilaterally.          Assessment & Plan:  PREVIOUS WEIGHT LOSS.  Weight down from last check.  Has been sick recently.   Had GI w/up.  Unable to perform EGD.  Colonoscopy performed (02/04/11 - diverticulosis/redundant colon).  Had UGI that revealed a hiatal hernia and reflux. On protonix.  Heme positive.  Hgb down significantly.   (8.4 this check. Down from 11.5 three months ago).  Will refer back to GI for further w/up.    HEALTH MAINTENANCE.  GI w/up as outlined. Physical 04/09/12.  She declined pelvic exam.  Mammogram 04/26/12 - Birads II.     I spent over 45 minutes with the patient and more than 50% of the time was spent in consultation regarding the above.

## 2013-05-10 NOTE — Assessment & Plan Note (Signed)
Cr increased more.  Recheck today.  Stay hydrated.  Hold lisinopril.  Follow.

## 2013-05-10 NOTE — Assessment & Plan Note (Signed)
Hemoglobin has been stable around 11.5.  Hgb yesterday 8.4 (decreased from 11.5 three months ago).  Recheck today to confirm stable.  Heme positive on exam.  Refer to GI for further w/up.  Discussed hospitalization.  Pt prefers not to be hospitalized if possible.  Follow closely.

## 2013-05-14 ENCOUNTER — Ambulatory Visit: Payer: Self-pay | Admitting: Gastroenterology

## 2013-05-14 ENCOUNTER — Telehealth: Payer: Self-pay | Admitting: *Deleted

## 2013-05-14 DIAGNOSIS — K2940 Chronic atrophic gastritis without bleeding: Secondary | ICD-10-CM | POA: Diagnosis not present

## 2013-05-14 DIAGNOSIS — K259 Gastric ulcer, unspecified as acute or chronic, without hemorrhage or perforation: Secondary | ICD-10-CM | POA: Diagnosis not present

## 2013-05-14 DIAGNOSIS — E785 Hyperlipidemia, unspecified: Secondary | ICD-10-CM | POA: Diagnosis not present

## 2013-05-14 DIAGNOSIS — K2970 Gastritis, unspecified, without bleeding: Secondary | ICD-10-CM | POA: Diagnosis not present

## 2013-05-14 DIAGNOSIS — K2960 Other gastritis without bleeding: Secondary | ICD-10-CM | POA: Diagnosis not present

## 2013-05-14 DIAGNOSIS — D649 Anemia, unspecified: Secondary | ICD-10-CM | POA: Diagnosis not present

## 2013-05-14 DIAGNOSIS — Z823 Family history of stroke: Secondary | ICD-10-CM | POA: Diagnosis not present

## 2013-05-14 DIAGNOSIS — Z8673 Personal history of transient ischemic attack (TIA), and cerebral infarction without residual deficits: Secondary | ICD-10-CM | POA: Diagnosis not present

## 2013-05-14 DIAGNOSIS — E7800 Pure hypercholesterolemia, unspecified: Secondary | ICD-10-CM | POA: Diagnosis not present

## 2013-05-14 DIAGNOSIS — K920 Hematemesis: Secondary | ICD-10-CM | POA: Diagnosis not present

## 2013-05-14 DIAGNOSIS — R195 Other fecal abnormalities: Secondary | ICD-10-CM | POA: Diagnosis not present

## 2013-05-14 DIAGNOSIS — K2990 Gastroduodenitis, unspecified, without bleeding: Secondary | ICD-10-CM | POA: Diagnosis not present

## 2013-05-14 DIAGNOSIS — Z888 Allergy status to other drugs, medicaments and biological substances status: Secondary | ICD-10-CM | POA: Diagnosis not present

## 2013-05-14 DIAGNOSIS — Z8249 Family history of ischemic heart disease and other diseases of the circulatory system: Secondary | ICD-10-CM | POA: Diagnosis not present

## 2013-05-14 DIAGNOSIS — K449 Diaphragmatic hernia without obstruction or gangrene: Secondary | ICD-10-CM | POA: Diagnosis not present

## 2013-05-14 DIAGNOSIS — Z881 Allergy status to other antibiotic agents status: Secondary | ICD-10-CM | POA: Diagnosis not present

## 2013-05-14 DIAGNOSIS — Z885 Allergy status to narcotic agent status: Secondary | ICD-10-CM | POA: Diagnosis not present

## 2013-05-14 DIAGNOSIS — K319 Disease of stomach and duodenum, unspecified: Secondary | ICD-10-CM | POA: Diagnosis not present

## 2013-05-14 DIAGNOSIS — I10 Essential (primary) hypertension: Secondary | ICD-10-CM | POA: Diagnosis not present

## 2013-05-14 LAB — HM COLONOSCOPY

## 2013-05-14 NOTE — Telephone Encounter (Signed)
Spoke with Tabitha at Dr. Gustavo Lah office & notified her

## 2013-05-14 NOTE — Telephone Encounter (Signed)
If feels necessary to hold plavix x 1 more week then ok.  Pt with recent bleeding and significant decrease in hgb.  Please let us know as soon as able to restart.

## 2013-05-14 NOTE — Telephone Encounter (Signed)
Pt had upper endoscopy & has a large gastric ulcer. Would like permission to hold her plavix for 1 more week. Please advise. (Call Tabitha @ [phone] Ext. [phone])

## 2013-05-15 ENCOUNTER — Telehealth: Payer: Self-pay | Admitting: Internal Medicine

## 2013-05-15 NOTE — Telephone Encounter (Signed)
Noted.  Tell her to keep us posted.   ?

## 2013-05-15 NOTE — Telephone Encounter (Signed)
Patient called states she went yesterday for the endoscopy she also had blood work done which showed her hemoglobin was low. She received a call from the cancer center to have patient come there on 05/24/13. She also rescheduled the appt with Dr. Nicki Reaper from 06/20/13 due to having a follow up with Dr. Dory Peru to follow up on what they did yesterday.

## 2013-05-15 NOTE — Telephone Encounter (Signed)
FYI

## 2013-05-16 ENCOUNTER — Ambulatory Visit: Payer: Self-pay | Admitting: Internal Medicine

## 2013-05-16 DIAGNOSIS — N63 Unspecified lump in unspecified breast: Secondary | ICD-10-CM | POA: Diagnosis not present

## 2013-05-16 DIAGNOSIS — N6019 Diffuse cystic mastopathy of unspecified breast: Secondary | ICD-10-CM | POA: Diagnosis not present

## 2013-05-16 LAB — HM MAMMOGRAPHY

## 2013-05-17 ENCOUNTER — Encounter: Payer: Self-pay | Admitting: Internal Medicine

## 2013-05-17 LAB — PATHOLOGY REPORT

## 2013-05-20 ENCOUNTER — Encounter: Payer: Self-pay | Admitting: *Deleted

## 2013-05-21 ENCOUNTER — Encounter: Payer: Self-pay | Admitting: Internal Medicine

## 2013-05-21 ENCOUNTER — Other Ambulatory Visit: Payer: Self-pay | Admitting: *Deleted

## 2013-05-21 MED ORDER — PRAVASTATIN SODIUM 40 MG PO TABS: 40.0000 mg | ORAL_TABLET | Freq: Every day | ORAL | Status: DC

## 2013-05-22 ENCOUNTER — Encounter: Payer: Self-pay | Admitting: Internal Medicine

## 2013-05-22 DIAGNOSIS — K259 Gastric ulcer, unspecified as acute or chronic, without hemorrhage or perforation: Secondary | ICD-10-CM | POA: Insufficient documentation

## 2013-05-24 ENCOUNTER — Ambulatory Visit: Payer: Self-pay | Admitting: Internal Medicine

## 2013-05-24 DIAGNOSIS — Z8673 Personal history of transient ischemic attack (TIA), and cerebral infarction without residual deficits: Secondary | ICD-10-CM | POA: Diagnosis not present

## 2013-05-24 DIAGNOSIS — E785 Hyperlipidemia, unspecified: Secondary | ICD-10-CM | POA: Diagnosis not present

## 2013-05-24 DIAGNOSIS — D509 Iron deficiency anemia, unspecified: Secondary | ICD-10-CM | POA: Diagnosis not present

## 2013-05-24 DIAGNOSIS — K921 Melena: Secondary | ICD-10-CM | POA: Diagnosis not present

## 2013-05-24 DIAGNOSIS — Z79899 Other long term (current) drug therapy: Secondary | ICD-10-CM | POA: Diagnosis not present

## 2013-05-24 DIAGNOSIS — I10 Essential (primary) hypertension: Secondary | ICD-10-CM | POA: Diagnosis not present

## 2013-05-24 LAB — CREATININE, SERUM
Creatinine: 1.39 mg/dL — ABNORMAL HIGH (ref 0.60–1.30)
EGFR (African American): 40 — ABNORMAL LOW
GFR CALC NON AF AMER: 35 — AB

## 2013-05-24 LAB — CBC CANCER CENTER
BASOS ABS: 0 x10 3/mm (ref 0.0–0.1)
HCT: 26.2 % — AB (ref 35.0–47.0)
HGB: 8.7 g/dL — ABNORMAL LOW (ref 12.0–16.0)
Lymphocytes: 20 %
MCH: 30.1 pg (ref 26.0–34.0)
MCHC: 33.4 g/dL (ref 32.0–36.0)
MCV: 90 fL (ref 80–100)
MONOS PCT: 4 %
PLATELETS: 200 x10 3/mm (ref 150–440)
RBC: 2.9 10*6/uL — ABNORMAL LOW (ref 3.80–5.20)
RDW: 14.7 % — AB (ref 11.5–14.5)
Segmented Neutrophils: 76 %
WBC: 5.5 x10 3/mm (ref 3.6–11.0)

## 2013-05-24 LAB — SEDIMENTATION RATE: Erythrocyte Sed Rate: 39 mm/hr — ABNORMAL HIGH (ref 0–30)

## 2013-05-24 LAB — RETICULOCYTES
Absolute Retic Count: 0.043 10*6/uL (ref 0.019–0.186)
Reticulocyte: 1.5 % (ref 0.4–3.1)

## 2013-05-24 LAB — IRON AND TIBC
Iron Bind.Cap.(Total): 419 ug/dL (ref 250–450)
Iron Saturation: 5 %
Iron: 21 ug/dL — ABNORMAL LOW (ref 50–170)
UNBOUND IRON-BIND. CAP.: 398 ug/dL

## 2013-05-24 LAB — FERRITIN: Ferritin (ARMC): 14 ng/mL (ref 8–388)

## 2013-05-24 LAB — LACTATE DEHYDROGENASE: LDH: 202 U/L (ref 81–246)

## 2013-05-24 LAB — FOLATE: Folic Acid: 40.2 ng/mL (ref 3.1–100.0)

## 2013-05-27 LAB — URINE IEP, RANDOM

## 2013-05-28 LAB — PROT IMMUNOELECTROPHORES(ARMC)

## 2013-05-30 ENCOUNTER — Other Ambulatory Visit: Payer: Self-pay | Admitting: *Deleted

## 2013-05-30 MED ORDER — CLOPIDOGREL BISULFATE 75 MG PO TABS: 75.0000 mg | ORAL_TABLET | Freq: Every day | ORAL | Status: DC

## 2013-06-05 ENCOUNTER — Encounter: Payer: Self-pay | Admitting: Internal Medicine

## 2013-06-05 DIAGNOSIS — K259 Gastric ulcer, unspecified as acute or chronic, without hemorrhage or perforation: Secondary | ICD-10-CM

## 2013-06-07 DIAGNOSIS — K921 Melena: Secondary | ICD-10-CM | POA: Diagnosis not present

## 2013-06-07 DIAGNOSIS — D509 Iron deficiency anemia, unspecified: Secondary | ICD-10-CM | POA: Diagnosis not present

## 2013-06-07 DIAGNOSIS — Z79899 Other long term (current) drug therapy: Secondary | ICD-10-CM | POA: Diagnosis not present

## 2013-06-18 ENCOUNTER — Encounter: Payer: Self-pay | Admitting: Internal Medicine

## 2013-06-20 ENCOUNTER — Ambulatory Visit: Payer: Medicare Other | Admitting: Internal Medicine

## 2013-06-20 DIAGNOSIS — D509 Iron deficiency anemia, unspecified: Secondary | ICD-10-CM | POA: Diagnosis not present

## 2013-06-20 DIAGNOSIS — K259 Gastric ulcer, unspecified as acute or chronic, without hemorrhage or perforation: Secondary | ICD-10-CM | POA: Diagnosis not present

## 2013-06-24 ENCOUNTER — Ambulatory Visit: Payer: Self-pay | Admitting: Internal Medicine

## 2013-06-25 ENCOUNTER — Encounter: Payer: Self-pay | Admitting: Internal Medicine

## 2013-07-05 ENCOUNTER — Ambulatory Visit (INDEPENDENT_AMBULATORY_CARE_PROVIDER_SITE_OTHER): Payer: BLUE CROSS/BLUE SHIELD | Admitting: Internal Medicine

## 2013-07-05 ENCOUNTER — Encounter: Payer: Self-pay | Admitting: Internal Medicine

## 2013-07-05 VITALS — BP 120/60 | HR 59 | Temp 98.3°F | Ht 62.0 in | Wt 120.0 lb

## 2013-07-05 DIAGNOSIS — D649 Anemia, unspecified: Secondary | ICD-10-CM | POA: Diagnosis not present

## 2013-07-05 DIAGNOSIS — Z733 Stress, not elsewhere classified: Secondary | ICD-10-CM | POA: Diagnosis not present

## 2013-07-05 DIAGNOSIS — I639 Cerebral infarction, unspecified: Secondary | ICD-10-CM

## 2013-07-05 DIAGNOSIS — I6350 Cerebral infarction due to unspecified occlusion or stenosis of unspecified cerebral artery: Secondary | ICD-10-CM | POA: Diagnosis not present

## 2013-07-05 DIAGNOSIS — F439 Reaction to severe stress, unspecified: Secondary | ICD-10-CM

## 2013-07-05 DIAGNOSIS — N63 Unspecified lump in unspecified breast: Secondary | ICD-10-CM

## 2013-07-05 DIAGNOSIS — E7800 Pure hypercholesterolemia, unspecified: Secondary | ICD-10-CM

## 2013-07-05 DIAGNOSIS — N289 Disorder of kidney and ureter, unspecified: Secondary | ICD-10-CM | POA: Diagnosis not present

## 2013-07-05 DIAGNOSIS — R195 Other fecal abnormalities: Secondary | ICD-10-CM

## 2013-07-05 DIAGNOSIS — I10 Essential (primary) hypertension: Secondary | ICD-10-CM | POA: Diagnosis not present

## 2013-07-05 DIAGNOSIS — R928 Other abnormal and inconclusive findings on diagnostic imaging of breast: Secondary | ICD-10-CM

## 2013-07-05 DIAGNOSIS — K259 Gastric ulcer, unspecified as acute or chronic, without hemorrhage or perforation: Secondary | ICD-10-CM

## 2013-07-05 LAB — CBC WITH DIFFERENTIAL/PLATELET
BASOS PCT: 1 % (ref 0–1)
Basophils Absolute: 0 10*3/uL (ref 0.0–0.1)
EOS PCT: 1 % (ref 0–5)
Eosinophils Absolute: 0 10*3/uL (ref 0.0–0.7)
HEMATOCRIT: 34.3 % — AB (ref 36.0–46.0)
Hemoglobin: 11.3 g/dL — ABNORMAL LOW (ref 12.0–15.0)
Lymphocytes Relative: 34 % (ref 12–46)
Lymphs Abs: 1.6 10*3/uL (ref 0.7–4.0)
MCH: 28.4 pg (ref 26.0–34.0)
MCHC: 32.9 g/dL (ref 30.0–36.0)
MCV: 86.2 fL (ref 78.0–100.0)
MONO ABS: 0.4 10*3/uL (ref 0.1–1.0)
Monocytes Relative: 8 % (ref 3–12)
NEUTROS ABS: 2.7 10*3/uL (ref 1.7–7.7)
Neutrophils Relative %: 56 % (ref 43–77)
Platelets: 210 10*3/uL (ref 150–400)
RBC: 3.98 MIL/uL (ref 3.87–5.11)
RDW: 18.7 % — ABNORMAL HIGH (ref 11.5–15.5)
WBC: 4.8 10*3/uL (ref 4.0–10.5)

## 2013-07-05 NOTE — Progress Notes (Signed)
Pre visit review using our clinic review tool, if applicable. No additional management support is needed unless otherwise documented below in the visit note. 

## 2013-07-06 LAB — BASIC METABOLIC PANEL
BUN: 25 mg/dL — AB (ref 6–23)
CO2: 28 mEq/L (ref 19–32)
CREATININE: 1.21 mg/dL — AB (ref 0.50–1.10)
Calcium: 9.2 mg/dL (ref 8.4–10.5)
Chloride: 103 mEq/L (ref 96–112)
Glucose, Bld: 76 mg/dL (ref 70–99)
Potassium: 4 mEq/L (ref 3.5–5.3)
Sodium: 141 mEq/L (ref 135–145)

## 2013-07-06 LAB — FERRITIN: FERRITIN: 29 ng/mL (ref 10–291)

## 2013-07-08 ENCOUNTER — Telehealth: Payer: Self-pay | Admitting: Internal Medicine

## 2013-07-08 ENCOUNTER — Encounter: Payer: Self-pay | Admitting: *Deleted

## 2013-07-08 NOTE — Telephone Encounter (Signed)
Spoke to [place].  I discussed with pt at her visit last week about stopping plavix.  She is ok with stopping and understands possible risk of stroke, etc.  Will stop 06/09/13.  Will resume as soon as GI ok.

## 2013-07-08 NOTE — Telephone Encounter (Signed)
Rhonda with Dr. Gustavo Lah office called needing an update on her cardiac clearance form. Pt is scheduled for an upper endo soon. Please advise on status.

## 2013-07-10 ENCOUNTER — Encounter: Payer: Self-pay | Admitting: Internal Medicine

## 2013-07-10 NOTE — Assessment & Plan Note (Signed)
EGD as outlined.  Ulcer present.  Planning for f/u EGD 07/15/13.  On protonix.

## 2013-07-10 NOTE — Assessment & Plan Note (Signed)
Stay hydrated.  Continue to hold lisinopril.  Follow pressures.  Check metabolic panel.

## 2013-07-10 NOTE — Progress Notes (Signed)
Subjective:    Patient ID: Rachel Martinez, female    DOB: [birth year], 80 y.o.   MRN: [id number]  HPI 80 year old female with past history of hypertension, hypercholesterolemia and CVA who comes in today for a scheduled follow up.   Recently evaluated for anemia and heme positive stool.  Had EGD 05/14/13 that revealed a clean base gastric ulcer.  She states she is doing relatively well.  Feels things overall are stable.  She is taking protonix.  Eating and drinking well.  She has lost weight.  Breathing stable.  Bowels stable.  Blood pressure has been relatively well off her blood pressure medication.  Blood pressures averaging 130-140s/60-70s.    Increased stress.  On zoloft.  States she is doing better.  Feels better.  Planning to have a f/u EGD 07/15/13.  Will need to be off plavix.  We discussed this today.  She understands the risk of stopping the medication.         Past Medical History  Diagnosis Date  . Heart palpitations   . Hypercholesteremia   . Hypertension   . CVA (cerebral vascular accident) 8/06  . Chicken pox   . Stroke     Current Outpatient Prescriptions on File Prior to Visit  Medication Sig Dispense Refill  . calcium carbonate (TUMS - DOSED IN MG ELEMENTAL CALCIUM) 500 MG chewable tablet Chew 1 tablet by mouth 3 (three) times daily as needed.       . clopidogrel (PLAVIX) 75 MG tablet Take 1 tablet (75 mg total) by mouth daily.  30 tablet  5  . diltiazem (TIAZAC) 120 MG 24 hr capsule Take 1 capsule (120 mg total) by mouth daily.  30 capsule  5  . Multiple Vitamin (MULTIVITAMIN) capsule Take 1 capsule by mouth daily.      . pravastatin (PRAVACHOL) 40 MG tablet Take 1 tablet (40 mg total) by mouth daily.  30 tablet  5  . lisinopril (PRINIVIL,ZESTRIL) 10 MG tablet Take 1 tablet (10 mg total) by mouth daily.  30 tablet  5   No current facility-administered medications on file prior to visit.    Review of Systems Patient denies any headache, lightheadedness or dizziness.   No significant sinus or allergy symptoms.  No chest pain, tightness or palpitations.  No increased shortness of breath, cough or congestion.  No nausea or vomiting.  No abdominal pain or cramping.  No bowel change, such as diarrhea, constipation, BRBPR or melana.  No urine change.  Feels she is handling stress relatively well.  Overall doing better.  Eating well/better.  Blood pressure as outlined.       Objective:   Physical Exam  Filed Vitals:   07/05/13 1554  BP: 120/60  Pulse: 59  Temp: 98.3 F (36.8 C)   Blood pressure recheck:  118/78, pulse 76  80 year old female in no acute distress.   HEENT:  Nares- clear.  Oropharynx - without lesions. NECK:  Supple.  Nontender.  No audible bruit.  HEART:  Appears to be regular. LUNGS:  No crackles or wheezing audible.  Respirations even and unlabored.  RADIAL PULSE:  Equal bilaterally.  ABDOMEN:  Soft, nontender.  Bowel sounds present and normal.  No audible abdominal bruit.    EXTREMITIES:  No increased edema present.  DP pulses palpable and equal bilaterally.          Assessment & Plan:  RIGHT CAROTID BRUIT.  Carotid ultrasound 04/22/13 revealed no significant stenosis.  Continue  daily plavix.          PREVIOUS WEIGHT LOSS.  Had GI w/up.  Colonoscopy performed (02/04/11 - diverticulosis/redundant colon).  Had UGI that revealed a hiatal hernia and reflux.  On protonix.  Had EGD that revealed a gastric ulcer.  Eating well now.  Planning for f/u EGD 07/15/13.  States she is eating and drinking well.  Follow.  Currently asymptomatic.     HEALTH MAINTENANCE.  GI w/up as outlined. Physical 04/09/12.  She declined pelvic exam.  Mammogram 05/16/13 - Birads II.

## 2013-07-10 NOTE — Assessment & Plan Note (Signed)
Back on plavix.  Check cbc today.  Will need to be off plavix for the f/u EGD.  Discussed with her today.  She understands the risk of stopping the medication.

## 2013-07-10 NOTE — Assessment & Plan Note (Signed)
Blood pressure doing well on no medication.  Follow.  Remain off the medication.

## 2013-07-10 NOTE — Assessment & Plan Note (Signed)
Diagnostic mammogram and ultrasound - Birads II.  Not an issue now.  Follow.

## 2013-07-10 NOTE — Assessment & Plan Note (Signed)
Increased stress.  Better now.  On zoloft 50mg  q day.  Doing better.  Follow.

## 2013-07-10 NOTE — Assessment & Plan Note (Signed)
Hemoglobin has been low.  On iron now.  Seeing hematology.  Recheck cbc.

## 2013-07-10 NOTE — Assessment & Plan Note (Signed)
EGD as outlined.  Currently doing well.  Planning for f/u EGD 07/15/13.  Continue protonix.

## 2013-07-10 NOTE — Assessment & Plan Note (Signed)
On pravastatin.  Follow lipid profile and liver panel.

## 2013-07-15 ENCOUNTER — Ambulatory Visit: Payer: Self-pay | Admitting: Gastroenterology

## 2013-07-15 DIAGNOSIS — K224 Dyskinesia of esophagus: Secondary | ICD-10-CM | POA: Diagnosis not present

## 2013-07-15 DIAGNOSIS — K2960 Other gastritis without bleeding: Secondary | ICD-10-CM | POA: Diagnosis not present

## 2013-07-15 DIAGNOSIS — K2990 Gastroduodenitis, unspecified, without bleeding: Secondary | ICD-10-CM | POA: Diagnosis not present

## 2013-07-15 DIAGNOSIS — K250 Acute gastric ulcer with hemorrhage: Secondary | ICD-10-CM | POA: Diagnosis not present

## 2013-07-15 DIAGNOSIS — K449 Diaphragmatic hernia without obstruction or gangrene: Secondary | ICD-10-CM | POA: Diagnosis not present

## 2013-07-15 DIAGNOSIS — I10 Essential (primary) hypertension: Secondary | ICD-10-CM | POA: Diagnosis not present

## 2013-07-15 DIAGNOSIS — Z79899 Other long term (current) drug therapy: Secondary | ICD-10-CM | POA: Diagnosis not present

## 2013-07-15 DIAGNOSIS — Z881 Allergy status to other antibiotic agents status: Secondary | ICD-10-CM | POA: Diagnosis not present

## 2013-07-15 DIAGNOSIS — K2970 Gastritis, unspecified, without bleeding: Secondary | ICD-10-CM | POA: Diagnosis not present

## 2013-07-15 DIAGNOSIS — Z8673 Personal history of transient ischemic attack (TIA), and cerebral infarction without residual deficits: Secondary | ICD-10-CM | POA: Diagnosis not present

## 2013-07-15 DIAGNOSIS — Z833 Family history of diabetes mellitus: Secondary | ICD-10-CM | POA: Diagnosis not present

## 2013-07-15 DIAGNOSIS — K319 Disease of stomach and duodenum, unspecified: Secondary | ICD-10-CM | POA: Diagnosis not present

## 2013-07-15 DIAGNOSIS — Z87891 Personal history of nicotine dependence: Secondary | ICD-10-CM | POA: Diagnosis not present

## 2013-07-15 DIAGNOSIS — K2940 Chronic atrophic gastritis without bleeding: Secondary | ICD-10-CM | POA: Diagnosis not present

## 2013-07-15 DIAGNOSIS — Z8249 Family history of ischemic heart disease and other diseases of the circulatory system: Secondary | ICD-10-CM | POA: Diagnosis not present

## 2013-07-15 DIAGNOSIS — E7800 Pure hypercholesterolemia, unspecified: Secondary | ICD-10-CM | POA: Diagnosis not present

## 2013-07-15 DIAGNOSIS — K259 Gastric ulcer, unspecified as acute or chronic, without hemorrhage or perforation: Secondary | ICD-10-CM | POA: Diagnosis not present

## 2013-07-15 DIAGNOSIS — D509 Iron deficiency anemia, unspecified: Secondary | ICD-10-CM | POA: Diagnosis not present

## 2013-07-15 DIAGNOSIS — Z888 Allergy status to other drugs, medicaments and biological substances status: Secondary | ICD-10-CM | POA: Diagnosis not present

## 2013-07-16 LAB — PATHOLOGY REPORT

## 2013-07-30 ENCOUNTER — Encounter: Payer: Self-pay | Admitting: Internal Medicine

## 2013-08-11 ENCOUNTER — Encounter: Payer: Self-pay | Admitting: Internal Medicine

## 2013-08-29 DIAGNOSIS — R634 Abnormal weight loss: Secondary | ICD-10-CM | POA: Diagnosis not present

## 2013-08-29 DIAGNOSIS — K2960 Other gastritis without bleeding: Secondary | ICD-10-CM | POA: Diagnosis not present

## 2013-08-29 DIAGNOSIS — D649 Anemia, unspecified: Secondary | ICD-10-CM | POA: Diagnosis not present

## 2013-09-04 ENCOUNTER — Telehealth: Payer: Self-pay | Admitting: Internal Medicine

## 2013-09-04 DIAGNOSIS — D649 Anemia, unspecified: Secondary | ICD-10-CM | POA: Diagnosis not present

## 2013-09-04 NOTE — Telephone Encounter (Signed)
GI called.  Pt needs a f/u with me regarding weight loss, etc.  Please schedule her an appt on 09/17/13 (46min) at 2:00.  Thanks.  This spot is open.

## 2013-09-17 ENCOUNTER — Encounter: Payer: Self-pay | Admitting: Internal Medicine

## 2013-09-17 ENCOUNTER — Ambulatory Visit (INDEPENDENT_AMBULATORY_CARE_PROVIDER_SITE_OTHER): Payer: Medicare Other | Admitting: Internal Medicine

## 2013-09-17 VITALS — BP 120/60 | HR 56 | Temp 98.2°F | Ht 62.0 in | Wt 120.8 lb

## 2013-09-17 DIAGNOSIS — F439 Reaction to severe stress, unspecified: Secondary | ICD-10-CM

## 2013-09-17 DIAGNOSIS — I6350 Cerebral infarction due to unspecified occlusion or stenosis of unspecified cerebral artery: Secondary | ICD-10-CM | POA: Diagnosis not present

## 2013-09-17 DIAGNOSIS — I10 Essential (primary) hypertension: Secondary | ICD-10-CM

## 2013-09-17 DIAGNOSIS — K253 Acute gastric ulcer without hemorrhage or perforation: Secondary | ICD-10-CM | POA: Diagnosis not present

## 2013-09-17 DIAGNOSIS — D649 Anemia, unspecified: Secondary | ICD-10-CM | POA: Diagnosis not present

## 2013-09-17 DIAGNOSIS — I639 Cerebral infarction, unspecified: Secondary | ICD-10-CM

## 2013-09-17 DIAGNOSIS — N289 Disorder of kidney and ureter, unspecified: Secondary | ICD-10-CM | POA: Diagnosis not present

## 2013-09-17 DIAGNOSIS — Z733 Stress, not elsewhere classified: Secondary | ICD-10-CM

## 2013-09-17 DIAGNOSIS — E7800 Pure hypercholesterolemia, unspecified: Secondary | ICD-10-CM

## 2013-09-17 MED ORDER — INTEGRA 62.5-62.5-40-3 MG PO CAPS: 1.0000 | ORAL_CAPSULE | Freq: Every day | ORAL | Status: DC

## 2013-09-17 NOTE — Progress Notes (Signed)
Pre visit review using our clinic review tool, if applicable. No additional management support is needed unless otherwise documented below in the visit note. 

## 2013-09-21 ENCOUNTER — Encounter: Payer: Self-pay | Admitting: Internal Medicine

## 2013-09-21 NOTE — Assessment & Plan Note (Signed)
Blood pressure doing well on no medication.  Follow.  Remain off the medication.

## 2013-09-21 NOTE — Assessment & Plan Note (Signed)
Increased stress.  Better now.  On zoloft 50mg  q day.  Doing better.  Follow.

## 2013-09-21 NOTE — Assessment & Plan Note (Signed)
EGD as outlined.  Currently doing well.  F/U EGD 07/15/13 - ulcer healed.   Continue protonix.

## 2013-09-21 NOTE — Assessment & Plan Note (Signed)
On pravastatin.  Follow lipid profile and liver panel.

## 2013-09-21 NOTE — Progress Notes (Signed)
Subjective:    Patient ID: Rachel Martinez, female    DOB: [birth year], 80 y.o.   MRN: [id number]  HPI 80 year old female with past history of hypertension, hypercholesterolemia and CVA who comes in today for a scheduled follow up.   Recently evaluated for anemia and heme positive stool.  Had EGD 05/14/13 that revealed a clean base gastric ulcer.  F/u EGD - ulcer healed.  Feels things overall are stable.  She is taking protonix.  Eating and drinking well.  She has lost weight.  Breathing stable.  Bowels stable.  Blood pressure has been relatively well off her blood pressure medication.   Increased stress.  On zoloft.  States she is doing better.  Feels better.  Still with increased fatigue.  Wants to change iron supplements.  Intolerance to current.          Past Medical History  Diagnosis Date  . Heart palpitations   . Hypercholesteremia   . Hypertension   . CVA (cerebral vascular accident) 8/06  . Chicken pox   . Stroke     Current Outpatient Prescriptions on File Prior to Visit  Medication Sig Dispense Refill  . calcium carbonate (TUMS - DOSED IN MG ELEMENTAL CALCIUM) 500 MG chewable tablet Chew 1 tablet by mouth 3 (three) times daily as needed.       . clopidogrel (PLAVIX) 75 MG tablet Take 1 tablet (75 mg total) by mouth daily.  30 tablet  5  . diltiazem (TIAZAC) 120 MG 24 hr capsule Take 1 capsule (120 mg total) by mouth daily.  30 capsule  5  . lisinopril (PRINIVIL,ZESTRIL) 10 MG tablet Take 1 tablet (10 mg total) by mouth daily.  30 tablet  5  . Multiple Vitamin (MULTIVITAMIN) capsule Take 1 capsule by mouth daily.      . pantoprazole (PROTONIX) 40 MG tablet Take 40 mg by mouth daily.      . pravastatin (PRAVACHOL) 40 MG tablet Take 1 tablet (40 mg total) by mouth daily.  30 tablet  5   No current facility-administered medications on file prior to visit.    Review of Systems Patient denies any headache, lightheadedness or dizziness.  No significant sinus or allergy symptoms.  No  chest pain, tightness or palpitations.  No increased shortness of breath, cough or congestion.  No nausea or vomiting.  No abdominal pain or cramping.  No bowel change, such as diarrhea, constipation, BRBPR or melana.  No urine change.  Feels she is handling stress relatively well.  Overall doing better.  Eating well/better.  Blood pressure as outlined.  Weight loss.  Increased fatigue.       Objective:   Physical Exam  Filed Vitals:   09/17/13 1355  BP: 120/60  Pulse: 56  Temp: 98.2 F (36.8 C)   Blood pressure recheck:  77/47  80 year old female in no acute distress.   HEENT:  Nares- clear.  Oropharynx - without lesions. NECK:  Supple.  Nontender.  No audible bruit.  HEART:  Appears to be regular. LUNGS:  No crackles or wheezing audible.  Respirations even and unlabored.  RADIAL PULSE:  Equal bilaterally.  ABDOMEN:  Soft, nontender.  Bowel sounds present and normal.  No audible abdominal bruit.    EXTREMITIES:  No increased edema present.  DP pulses palpable and equal bilaterally.          Assessment & Plan:  RIGHT CAROTID BRUIT.  Carotid ultrasound 04/22/13 revealed no significant stenosis.  Continue  daily plavix.          PREVIOUS WEIGHT LOSS.  Had GI w/up.  Colonoscopy performed (02/04/11 - diverticulosis/redundant colon).  Had UGI that revealed a hiatal hernia and reflux.  On protonix.  Had EGD that revealed a gastric ulcer.  F/u EGD - ulcer healed.  States she is eating well.  Weight down.  Follow.  Currently asymptomatic.     HEALTH MAINTENANCE.  GI w/up as outlined.  Schedule a physical.  She declined pelvic exam.  Mammogram 05/16/13 - Birads II.

## 2013-09-21 NOTE — Assessment & Plan Note (Signed)
Stay hydrated.  Follow pressures.  Follow metabolic panel.  

## 2013-09-21 NOTE — Assessment & Plan Note (Addendum)
Hemoglobin has been low.  On iron now.  Will change to integra due to intolerance of current iron supplements.  Seeing hematology.  Recheck cbc.  Has been better.  Follow.

## 2013-09-21 NOTE — Assessment & Plan Note (Signed)
Back on plavix.  Stable.

## 2013-10-03 ENCOUNTER — Encounter: Payer: Self-pay | Admitting: *Deleted

## 2013-10-03 ENCOUNTER — Other Ambulatory Visit (INDEPENDENT_AMBULATORY_CARE_PROVIDER_SITE_OTHER): Payer: Medicare Other

## 2013-10-03 DIAGNOSIS — E7800 Pure hypercholesterolemia, unspecified: Secondary | ICD-10-CM

## 2013-10-03 DIAGNOSIS — I10 Essential (primary) hypertension: Secondary | ICD-10-CM | POA: Diagnosis not present

## 2013-10-03 DIAGNOSIS — D649 Anemia, unspecified: Secondary | ICD-10-CM | POA: Diagnosis not present

## 2013-10-03 LAB — BASIC METABOLIC PANEL
BUN: 22 mg/dL (ref 6–23)
CALCIUM: 9.2 mg/dL (ref 8.4–10.5)
CHLORIDE: 104 meq/L (ref 96–112)
CO2: 28 meq/L (ref 19–32)
Creatinine, Ser: 1.2 mg/dL (ref 0.4–1.2)
GFR: 44.57 mL/min — ABNORMAL LOW (ref >60.00)
Glucose, Bld: 81 mg/dL (ref 70–99)
Potassium: 4 mEq/L (ref 3.5–5.1)
SODIUM: 140 meq/L (ref 135–145)

## 2013-10-03 LAB — CBC WITH DIFFERENTIAL/PLATELET
BASOS ABS: 0 10*3/uL (ref 0.0–0.1)
Basophils Relative: 0.8 % (ref 0.0–3.0)
Eosinophils Absolute: 0.1 10*3/uL (ref 0.0–0.7)
Eosinophils Relative: 2.2 % (ref 0.0–5.0)
HEMATOCRIT: 37 % (ref 36.0–46.0)
Hemoglobin: 12.5 g/dL (ref 12.0–15.0)
LYMPHS ABS: 2.1 10*3/uL (ref 0.7–4.0)
Lymphocytes Relative: 46.1 % — ABNORMAL HIGH (ref 12.0–46.0)
MCHC: 33.7 g/dL (ref 30.0–36.0)
MCV: 88.8 fl (ref 78.0–100.0)
MONO ABS: 0.3 10*3/uL (ref 0.1–1.0)
Monocytes Relative: 7.1 % (ref 3.0–12.0)
NEUTROS PCT: 43.8 % (ref 43.0–77.0)
Neutro Abs: 2 10*3/uL (ref 1.4–7.7)
PLATELETS: 195 10*3/uL (ref 150.0–400.0)
RBC: 4.17 Mil/uL (ref 3.87–5.11)
RDW: 15.4 % (ref 11.5–15.5)
WBC: 4.5 10*3/uL (ref 4.0–10.5)

## 2013-10-03 LAB — HEPATIC FUNCTION PANEL
ALK PHOS: 62 U/L (ref 39–117)
ALT: 10 U/L (ref 0–35)
AST: 23 U/L (ref 0–37)
Albumin: 3.9 g/dL (ref 3.5–5.2)
BILIRUBIN DIRECT: 0 mg/dL (ref 0.0–0.3)
TOTAL PROTEIN: 6.6 g/dL (ref 6.0–8.3)
Total Bilirubin: 0.7 mg/dL (ref 0.2–1.2)

## 2013-10-03 LAB — FERRITIN: FERRITIN: 32.2 ng/mL (ref 10.0–291.0)

## 2013-10-03 LAB — LIPID PANEL
Cholesterol: 183 mg/dL (ref 0–200)
HDL: 64.7 mg/dL (ref >39.00)
LDL Cholesterol: 92 mg/dL (ref 0–99)
NonHDL: 118.3
Total CHOL/HDL Ratio: 3
Triglycerides: 130 mg/dL (ref 0.0–149.0)
VLDL: 26 mg/dL (ref 0.0–40.0)

## 2013-10-10 ENCOUNTER — Ambulatory Visit: Payer: Self-pay | Admitting: Internal Medicine

## 2013-10-10 DIAGNOSIS — I10 Essential (primary) hypertension: Secondary | ICD-10-CM | POA: Diagnosis not present

## 2013-10-10 DIAGNOSIS — R0609 Other forms of dyspnea: Secondary | ICD-10-CM | POA: Diagnosis not present

## 2013-10-10 DIAGNOSIS — E785 Hyperlipidemia, unspecified: Secondary | ICD-10-CM | POA: Diagnosis not present

## 2013-10-10 DIAGNOSIS — D509 Iron deficiency anemia, unspecified: Secondary | ICD-10-CM | POA: Diagnosis not present

## 2013-10-10 DIAGNOSIS — Z87891 Personal history of nicotine dependence: Secondary | ICD-10-CM | POA: Diagnosis not present

## 2013-10-10 DIAGNOSIS — Z79899 Other long term (current) drug therapy: Secondary | ICD-10-CM | POA: Diagnosis not present

## 2013-10-11 DIAGNOSIS — Z87891 Personal history of nicotine dependence: Secondary | ICD-10-CM | POA: Diagnosis not present

## 2013-10-11 DIAGNOSIS — E785 Hyperlipidemia, unspecified: Secondary | ICD-10-CM | POA: Diagnosis not present

## 2013-10-11 DIAGNOSIS — D509 Iron deficiency anemia, unspecified: Secondary | ICD-10-CM | POA: Diagnosis not present

## 2013-10-11 DIAGNOSIS — I10 Essential (primary) hypertension: Secondary | ICD-10-CM | POA: Diagnosis not present

## 2013-10-11 DIAGNOSIS — Z79899 Other long term (current) drug therapy: Secondary | ICD-10-CM | POA: Diagnosis not present

## 2013-10-11 DIAGNOSIS — R0989 Other specified symptoms and signs involving the circulatory and respiratory systems: Secondary | ICD-10-CM | POA: Diagnosis not present

## 2013-10-11 DIAGNOSIS — R0609 Other forms of dyspnea: Secondary | ICD-10-CM | POA: Diagnosis not present

## 2013-10-11 LAB — CBC CANCER CENTER
BASOS PCT: 1.1 %
Basophil #: 0 x10 3/mm (ref 0.0–0.1)
EOS ABS: 0 x10 3/mm (ref 0.0–0.7)
Eosinophil %: 1.1 %
HCT: 38.9 % (ref 35.0–47.0)
HGB: 12.9 g/dL (ref 12.0–16.0)
LYMPHS ABS: 1.4 x10 3/mm (ref 1.0–3.6)
Lymphocyte %: 34.7 %
MCH: 30 pg (ref 26.0–34.0)
MCHC: 33.3 g/dL (ref 32.0–36.0)
MCV: 90 fL (ref 80–100)
MONO ABS: 0.3 x10 3/mm (ref 0.2–0.9)
MONOS PCT: 8.2 %
NEUTROS ABS: 2.3 x10 3/mm (ref 1.4–6.5)
Neutrophil %: 54.9 %
Platelet: 180 x10 3/mm (ref 150–440)
RBC: 4.32 10*6/uL (ref 3.80–5.20)
RDW: 14.7 % — ABNORMAL HIGH (ref 11.5–14.5)
WBC: 4.1 x10 3/mm (ref 3.6–11.0)

## 2013-10-11 LAB — IRON AND TIBC
Iron Bind.Cap.(Total): 354 ug/dL (ref 250–450)
Iron Saturation: 29 %
Iron: 103 ug/dL (ref 50–170)
UNBOUND IRON-BIND. CAP.: 251 ug/dL

## 2013-10-11 LAB — FERRITIN: Ferritin (ARMC): 40 ng/mL (ref 8–388)

## 2013-10-24 ENCOUNTER — Ambulatory Visit: Payer: Self-pay | Admitting: Internal Medicine

## 2013-10-28 ENCOUNTER — Other Ambulatory Visit: Payer: Self-pay | Admitting: *Deleted

## 2013-10-28 MED ORDER — DILTIAZEM HCL ER BEADS 120 MG PO CP24: 120.0000 mg | ORAL_CAPSULE | Freq: Every day | ORAL | Status: DC

## 2013-11-20 ENCOUNTER — Ambulatory Visit (INDEPENDENT_AMBULATORY_CARE_PROVIDER_SITE_OTHER): Payer: Medicare Other | Admitting: Internal Medicine

## 2013-11-20 ENCOUNTER — Encounter: Payer: Self-pay | Admitting: Internal Medicine

## 2013-11-20 VITALS — BP 110/60 | HR 65 | Temp 98.4°F | Ht 62.0 in | Wt 121.5 lb

## 2013-11-20 DIAGNOSIS — F439 Reaction to severe stress, unspecified: Secondary | ICD-10-CM

## 2013-11-20 DIAGNOSIS — K253 Acute gastric ulcer without hemorrhage or perforation: Secondary | ICD-10-CM | POA: Diagnosis not present

## 2013-11-20 DIAGNOSIS — I639 Cerebral infarction, unspecified: Secondary | ICD-10-CM

## 2013-11-20 DIAGNOSIS — Z658 Other specified problems related to psychosocial circumstances: Secondary | ICD-10-CM

## 2013-11-20 DIAGNOSIS — E7800 Pure hypercholesterolemia, unspecified: Secondary | ICD-10-CM

## 2013-11-20 DIAGNOSIS — Z23 Encounter for immunization: Secondary | ICD-10-CM | POA: Diagnosis not present

## 2013-11-20 DIAGNOSIS — D649 Anemia, unspecified: Secondary | ICD-10-CM | POA: Diagnosis not present

## 2013-11-20 DIAGNOSIS — I10 Essential (primary) hypertension: Secondary | ICD-10-CM | POA: Diagnosis not present

## 2013-11-20 DIAGNOSIS — R0989 Other specified symptoms and signs involving the circulatory and respiratory systems: Secondary | ICD-10-CM

## 2013-11-20 DIAGNOSIS — N289 Disorder of kidney and ureter, unspecified: Secondary | ICD-10-CM | POA: Diagnosis not present

## 2013-11-20 NOTE — Progress Notes (Signed)
Pre visit review using our clinic review tool, if applicable. No additional management support is needed unless otherwise documented below in the visit note. 

## 2013-11-25 ENCOUNTER — Encounter: Payer: Self-pay | Admitting: Internal Medicine

## 2013-11-25 MED ORDER — SERTRALINE HCL 50 MG PO TABS: 50.0000 mg | ORAL_TABLET | Freq: Every day | ORAL | Status: DC

## 2013-11-25 NOTE — Progress Notes (Signed)
Subjective:    Patient ID: Rachel Martinez, female    DOB: 20-Jan-1928, 80 y.o.   MRN: [id number]  HPI 80 year old female with past history of hypertension, hypercholesterolemia and CVA who comes in today for a scheduled follow up.   Recently evaluated for anemia and heme positive stool.  Had EGD 05/14/13 that revealed a clean base gastric ulcer.  F/u EGD - ulcer healed.  Feels things overall are stable.  She is taking protonix.  Eating and drinking well.  Weight is up from last check.  States has a good appetite.   Breathing stable.  Bowels stable.  Blood pressure has been relatively well controlled.   Increased stress.  Off zoloft.  Not sure how she got off the medication.  She felt it was helping.  Will restart.           Past Medical History  Diagnosis Date  . Heart palpitations   . Hypercholesteremia   . Hypertension   . CVA (cerebral vascular accident) 8/06  . Chicken pox   . Stroke     Current Outpatient Prescriptions on File Prior to Visit  Medication Sig Dispense Refill  . calcium carbonate (TUMS - DOSED IN MG ELEMENTAL CALCIUM) 500 MG chewable tablet Chew 1 tablet by mouth 3 (three) times daily as needed.     . clopidogrel (PLAVIX) 75 MG tablet Take 1 tablet (75 mg total) by mouth daily. 30 tablet 5  . Cyanocobalamin (B-12 PO) Take by mouth.    . diltiazem (TIAZAC) 120 MG 24 hr capsule Take 1 capsule (120 mg total) by mouth daily. 30 capsule 5  . Fe Fum-FePoly-Vit C-Vit B3 (INTEGRA) 62.5-62.5-40-3 MG CAPS Take 1 capsule by mouth daily. 30 capsule 2  . lisinopril (PRINIVIL,ZESTRIL) 10 MG tablet Take 1 tablet (10 mg total) by mouth daily. 30 tablet 5  . Multiple Vitamin (MULTIVITAMIN) capsule Take 1 capsule by mouth daily.    . pantoprazole (PROTONIX) 40 MG tablet Take 40 mg by mouth daily.    . pravastatin (PRAVACHOL) 40 MG tablet Take 1 tablet (40 mg total) by mouth daily. 30 tablet 5  . SUCRALFATE PO Take by mouth 3 (three) times daily.     No current facility-administered  medications on file prior to visit.    Review of Systems Patient denies any headache, lightheadedness or dizziness.  No significant sinus or allergy symptoms.  No chest pain, tightness or palpitations.  No increased shortness of breath, cough or congestion.  No nausea or vomiting.  No abdominal pain or cramping.  No bowel change, such as diarrhea, constipation, BRBPR or melana.  No urine change.  Feels she is handling stress relatively well.  Feels needs to be back on zoloft.  Stopped by mistake.   Eating well/better.  Blood pressure as outlined.  Weight stable/slightly improved.       Objective:   Physical Exam  Filed Vitals:   11/20/13 0947  BP: 110/60  Pulse: 65  Temp: 98.4 F (36.9 C)   Blood pressure recheck:  37/76  80 year old female in no acute distress.   HEENT:  Nares- clear.  Oropharynx - without lesions. NECK:  Supple.  Nontender.    HEART:  Appears to be regular. LUNGS:  No crackles or wheezing audible.  Respirations even and unlabored.  RADIAL PULSE:  Equal bilaterally.  ABDOMEN:  Soft, nontender.  Bowel sounds present and normal.  No audible abdominal bruit.    EXTREMITIES:  No increased edema present.  DP pulses palpable and equal bilaterally.          Assessment & Plan:  RIGHT CAROTID BRUIT.  Carotid ultrasound 04/22/13 revealed no significant stenosis.  Continue daily plavix.          PREVIOUS WEIGHT LOSS.  Had GI w/up.  Colonoscopy performed (02/04/11 - diverticulosis/redundant colon).  Had UGI that revealed a hiatal hernia and reflux.  On protonix.  Had EGD that revealed a gastric ulcer.  F/u EGD - ulcer healed.  States she is eating well.  Weight improved slightly from last check.  Follow.   Encounter for immunization Flu shot given.   CVA (cerebral vascular accident) On plavix.  Stable.   Essential hypertension Blood pressure doing well.  Follow.  Cr 10/03/13 - 1.2.    Acute gastric ulcer Follow up EGD 07/15/13 - ulcer healed.  Continue protonix.    Renal insufficiency Cr just checked 1.2.  Follow.   Hypercholesterolemia Continue pravastatin.  LDL 92 (10/03/13).   Lab Results  Component Value Date   CHOL 183 10/03/2013   HDL 64.70 10/03/2013   LDLCALC 92 10/03/2013   TRIG 130.0 10/03/2013   CHOLHDL 3 10/03/2013   Anemia, unspecified anemia type On iron.  Hgb just checked 10/03/13 - 12.5.    Stress Off zoloft by mistake.  Restart.    HEALTH MAINTENANCE.  GI w/up as outlined.  Schedule a physical.  She declined pelvic exam.  Mammogram 05/16/13 - Birads II.

## 2013-11-26 DIAGNOSIS — K2950 Unspecified chronic gastritis without bleeding: Secondary | ICD-10-CM | POA: Diagnosis not present

## 2013-11-26 DIAGNOSIS — Z789 Other specified health status: Secondary | ICD-10-CM | POA: Diagnosis not present

## 2013-11-26 DIAGNOSIS — D509 Iron deficiency anemia, unspecified: Secondary | ICD-10-CM | POA: Diagnosis not present

## 2014-01-20 ENCOUNTER — Other Ambulatory Visit: Payer: Self-pay | Admitting: *Deleted

## 2014-01-20 MED ORDER — CLOPIDOGREL BISULFATE 75 MG PO TABS: 75.0000 mg | ORAL_TABLET | Freq: Every day | ORAL | Status: DC

## 2014-01-30 ENCOUNTER — Other Ambulatory Visit: Payer: Self-pay

## 2014-01-30 MED ORDER — PRAVASTATIN SODIUM 40 MG PO TABS: 40.0000 mg | ORAL_TABLET | Freq: Every day | ORAL | Status: DC

## 2014-02-18 ENCOUNTER — Telehealth: Payer: Self-pay | Admitting: *Deleted

## 2014-02-18 DIAGNOSIS — I639 Cerebral infarction, unspecified: Secondary | ICD-10-CM

## 2014-02-18 DIAGNOSIS — D649 Anemia, unspecified: Secondary | ICD-10-CM

## 2014-02-18 DIAGNOSIS — I10 Essential (primary) hypertension: Secondary | ICD-10-CM

## 2014-02-18 DIAGNOSIS — N289 Disorder of kidney and ureter, unspecified: Secondary | ICD-10-CM

## 2014-02-18 DIAGNOSIS — E7800 Pure hypercholesterolemia, unspecified: Secondary | ICD-10-CM

## 2014-02-18 NOTE — Telephone Encounter (Signed)
Pt coming in tomorrow what labs and dx?  

## 2014-02-18 NOTE — Telephone Encounter (Signed)
Orders placed for labs

## 2014-02-19 ENCOUNTER — Other Ambulatory Visit (INDEPENDENT_AMBULATORY_CARE_PROVIDER_SITE_OTHER): Payer: Medicare Other

## 2014-02-19 DIAGNOSIS — D649 Anemia, unspecified: Secondary | ICD-10-CM | POA: Diagnosis not present

## 2014-02-19 DIAGNOSIS — E7800 Pure hypercholesterolemia, unspecified: Secondary | ICD-10-CM

## 2014-02-19 DIAGNOSIS — N289 Disorder of kidney and ureter, unspecified: Secondary | ICD-10-CM

## 2014-02-19 LAB — LIPID PANEL
CHOL/HDL RATIO: 3
Cholesterol: 187 mg/dL (ref 0–200)
HDL: 62.3 mg/dL (ref >39.00)
LDL Cholesterol: 93 mg/dL (ref 0–99)
NONHDL: 124.7
Triglycerides: 158 mg/dL — ABNORMAL HIGH (ref 0.0–149.0)
VLDL: 31.6 mg/dL (ref 0.0–40.0)

## 2014-02-19 LAB — CBC WITH DIFFERENTIAL/PLATELET
BASOS ABS: 0 10*3/uL (ref 0.0–0.1)
Basophils Relative: 0.7 % (ref 0.0–3.0)
EOS ABS: 0.1 10*3/uL (ref 0.0–0.7)
Eosinophils Relative: 1.4 % (ref 0.0–5.0)
HEMATOCRIT: 37.8 % (ref 36.0–46.0)
HEMOGLOBIN: 12.9 g/dL (ref 12.0–15.0)
LYMPHS ABS: 1.5 10*3/uL (ref 0.7–4.0)
Lymphocytes Relative: 36.8 % (ref 12.0–46.0)
MCHC: 34.2 g/dL (ref 30.0–36.0)
MCV: 91.7 fl (ref 78.0–100.0)
Monocytes Absolute: 0.3 10*3/uL (ref 0.1–1.0)
Monocytes Relative: 7.7 % (ref 3.0–12.0)
Neutro Abs: 2.2 10*3/uL (ref 1.4–7.7)
Neutrophils Relative %: 53.4 % (ref 43.0–77.0)
Platelets: 182 10*3/uL (ref 150.0–400.0)
RBC: 4.12 Mil/uL (ref 3.87–5.11)
RDW: 13.5 % (ref 11.5–15.5)
WBC: 4 10*3/uL (ref 4.0–10.5)

## 2014-02-19 LAB — HEPATIC FUNCTION PANEL
ALBUMIN: 4 g/dL (ref 3.5–5.2)
ALT: 8 U/L (ref 0–35)
AST: 21 U/L (ref 0–37)
Alkaline Phosphatase: 69 U/L (ref 39–117)
Bilirubin, Direct: 0 mg/dL (ref 0.0–0.3)
TOTAL PROTEIN: 6.5 g/dL (ref 6.0–8.3)
Total Bilirubin: 0.4 mg/dL (ref 0.2–1.2)

## 2014-02-19 LAB — BASIC METABOLIC PANEL
BUN: 21 mg/dL (ref 6–23)
CO2: 29 mEq/L (ref 19–32)
Calcium: 9.3 mg/dL (ref 8.4–10.5)
Chloride: 106 mEq/L (ref 96–112)
Creatinine, Ser: 1.11 mg/dL (ref 0.40–1.20)
GFR: 49.66 mL/min — AB (ref >60.00)
GLUCOSE: 91 mg/dL (ref 70–99)
Potassium: 4.1 mEq/L (ref 3.5–5.1)
Sodium: 141 mEq/L (ref 135–145)

## 2014-02-19 LAB — FERRITIN: FERRITIN: 37.4 ng/mL (ref 10.0–291.0)

## 2014-02-19 LAB — TSH: TSH: 2.22 u[IU]/mL (ref 0.35–4.50)

## 2014-02-21 ENCOUNTER — Encounter: Payer: Self-pay | Admitting: Internal Medicine

## 2014-02-21 ENCOUNTER — Ambulatory Visit (INDEPENDENT_AMBULATORY_CARE_PROVIDER_SITE_OTHER): Payer: Medicare Other | Admitting: Internal Medicine

## 2014-02-21 VITALS — BP 123/66 | HR 63 | Temp 98.3°F | Ht 61.2 in | Wt 123.8 lb

## 2014-02-21 DIAGNOSIS — E7800 Pure hypercholesterolemia, unspecified: Secondary | ICD-10-CM

## 2014-02-21 DIAGNOSIS — Z1239 Encounter for other screening for malignant neoplasm of breast: Secondary | ICD-10-CM

## 2014-02-21 DIAGNOSIS — I639 Cerebral infarction, unspecified: Secondary | ICD-10-CM | POA: Diagnosis not present

## 2014-02-21 DIAGNOSIS — I10 Essential (primary) hypertension: Secondary | ICD-10-CM | POA: Diagnosis not present

## 2014-02-21 DIAGNOSIS — Z658 Other specified problems related to psychosocial circumstances: Secondary | ICD-10-CM

## 2014-02-21 DIAGNOSIS — D649 Anemia, unspecified: Secondary | ICD-10-CM | POA: Diagnosis not present

## 2014-02-21 DIAGNOSIS — N289 Disorder of kidney and ureter, unspecified: Secondary | ICD-10-CM | POA: Diagnosis not present

## 2014-02-21 DIAGNOSIS — K253 Acute gastric ulcer without hemorrhage or perforation: Secondary | ICD-10-CM | POA: Diagnosis not present

## 2014-02-21 DIAGNOSIS — F439 Reaction to severe stress, unspecified: Secondary | ICD-10-CM

## 2014-02-21 NOTE — Progress Notes (Signed)
Patient ID: Rachel Martinez, female   DOB: Sep 01, 1927, 81 y.o.   MRN: [id number]   Subjective:    Patient ID: Rachel Martinez, female    DOB: Jun 23, 1927, 81 y.o.   MRN: [id number]  HPI  Patient here for follow up of her chronic medical issues and for her complete physical exam.  States she is doing relatively well.  Breathing stable.  No cough or congestion.  Appetite better.  Weight is up some.  Handling stress relatively well.  Still occasionally will feel some stress, but overall better.  Taking zoloft.  Feels this is helping.    Past Medical History  Diagnosis Date  . Heart palpitations   . Hypercholesteremia   . Hypertension   . CVA (cerebral vascular accident) 8/06  . Chicken pox   . Stroke     Outpatient Encounter Prescriptions as of 02/21/2014  Medication Sig  . calcium carbonate (TUMS - DOSED IN MG ELEMENTAL CALCIUM) 500 MG chewable tablet Chew 1 tablet by mouth 3 (three) times daily as needed.   . clopidogrel (PLAVIX) 75 MG tablet Take 1 tablet (75 mg total) by mouth daily.  . Cyanocobalamin (B-12 PO) Take by mouth.  . diltiazem (TIAZAC) 120 MG 24 hr capsule Take 1 capsule (120 mg total) by mouth daily.  . Fe Fum-FePoly-Vit C-Vit B3 (INTEGRA) 62.5-62.5-40-3 MG CAPS Take 1 capsule by mouth daily.  Marland Kitchen lisinopril (PRINIVIL,ZESTRIL) 10 MG tablet Take 1 tablet (10 mg total) by mouth daily.  . Multiple Vitamin (MULTIVITAMIN) capsule Take 1 capsule by mouth daily.  . pantoprazole (PROTONIX) 40 MG tablet Take 40 mg by mouth daily.  . pravastatin (PRAVACHOL) 40 MG tablet Take 1 tablet (40 mg total) by mouth daily.  . sertraline (ZOLOFT) 50 MG tablet Take 1 tablet (50 mg total) by mouth daily.  . SUCRALFATE PO Take by mouth 3 (three) times daily.    Review of Systems  Constitutional: Positive for appetite change (appetite better) and fatigue (minimal fatigue). Negative for activity change and unexpected weight change.  HENT: Negative for congestion, postnasal drip and sinus pressure.    Eyes: Negative for pain and visual disturbance.  Respiratory: Negative for cough, chest tightness and shortness of breath.   Cardiovascular: Negative for chest pain and leg swelling.  Gastrointestinal: Negative for nausea, vomiting, abdominal pain, diarrhea and constipation.  Genitourinary: Negative for dysuria and frequency.  Musculoskeletal: Negative for joint swelling and neck pain.  Skin: Negative for color change and rash.  Neurological: Negative for dizziness, light-headedness and headaches.  Hematological: Negative for adenopathy. Does not bruise/bleed easily.  Psychiatric/Behavioral: Negative for sleep disturbance. The patient is nervous/anxious (feels she is handling stress well.  feels zoloft is helping.).        Objective:    Physical Exam  Constitutional: She is oriented to person, place, and time. She appears well-developed and well-nourished. No distress.  HENT:  Head: Normocephalic and atraumatic.  Nose: Nose normal.  Mouth/Throat: Oropharynx is clear and moist.  Eyes: Conjunctivae are normal. Right eye exhibits no discharge. Left eye exhibits no discharge. No scleral icterus.  Neck: Normal range of motion. Neck supple. No thyromegaly present.  Cardiovascular: Normal rate, regular rhythm, normal heart sounds and intact distal pulses.   Pulmonary/Chest: Effort normal and breath sounds normal. No accessory muscle usage. No tachypnea. No respiratory distress. She has no decreased breath sounds. She has no wheezes. She has no rhonchi. Right breast exhibits no inverted nipple, no mass, no nipple discharge and no tenderness (no  axillary adenopathy). Left breast exhibits no inverted nipple, no mass, no nipple discharge and no tenderness (no axilarry adenopathy).  Abdominal: Soft. Bowel sounds are normal. There is no tenderness.  Musculoskeletal: She exhibits no edema or tenderness.  Lymphadenopathy:    She has no cervical adenopathy.  Neurological: She is alert and oriented to  person, place, and time.  Skin: Skin is warm and dry. No rash noted. No erythema.  Psychiatric: She has a normal mood and affect. Her behavior is normal.    BP 123/66 mmHg  Pulse 63  Temp(Src) 98.3 F (36.8 C) (Oral)  Ht 5' 1.2" (1.554 m)  Wt 123 lb 12 oz (56.133 kg)  BMI 23.24 kg/m2  SpO2 98% Wt Readings from Last 3 Encounters:  02/21/14 123 lb 12 oz (56.133 kg)  11/20/13 121 lb 8 oz (55.112 kg)  09/17/13 120 lb 12 oz (54.772 kg)     Lab Results  Component Value Date   WBC 4.0 02/19/2014   HGB 12.9 02/19/2014   HCT 37.8 02/19/2014   PLT 182.0 02/19/2014   GLUCOSE 91 02/19/2014   CHOL 187 02/19/2014   TRIG 158.0* 02/19/2014   HDL 62.30 02/19/2014   LDLCALC 93 02/19/2014   ALT 8 02/19/2014   AST 21 02/19/2014   NA 141 02/19/2014   K 4.1 02/19/2014   CL 106 02/19/2014   CREATININE 1.11 02/19/2014   BUN 21 02/19/2014   CO2 29 02/19/2014   TSH 2.22 02/19/2014      Assessment & Plan:   Problem List Items Addressed This Visit    Anemia    Hgb just checked and wnl - 12.9.  Has had GI w/up.  Decrease iron to twice weekly.  Follow cbc.        Relevant Orders   CBC with Differential/Platelet   Ferritin   CVA (cerebral vascular accident)    On plavix.  Doing well.        Gastric ulcer    EGD as outlined.  Had f/u EGD 07/15/13 - ulcer healed.  Still with gastritis and hiatal hernia.  Continue on protonix.  Symptoms controlled.        Hypercholesterolemia    On simvastatin.  Follow lipid panel and liver function tests.  Lab Results  Component Value Date   CHOL 187 02/19/2014   HDL 62.30 02/19/2014   LDLCALC 93 02/19/2014   TRIG 158.0* 02/19/2014   CHOLHDL 3 02/19/2014        Relevant Orders   Hepatic function panel   Lipid panel   Hypertension - Primary    Blood pressure doing well.  Same medication regiment.  Follow metabolic panel.        Relevant Orders   Basic metabolic panel   Renal insufficiency    Continue to stay hydrated.  Follow renal  function.       Stress    Still with some increased stress.  Feels she is handling things relatively well.  On zoloft.  Feels helping.  Follow.         Other Visit Diagnoses    Breast cancer screening        Relevant Orders    MM DIGITAL SCREENING BILATERAL      I spent 25 minutes with the patient and more than 50% of the time was spent in consultation regarding the above.    Einar Pheasant, MD

## 2014-02-21 NOTE — Progress Notes (Signed)
Pre visit review using our clinic review tool, if applicable. No additional management support is needed unless otherwise documented below in the visit note. 

## 2014-02-23 ENCOUNTER — Encounter: Payer: Self-pay | Admitting: Internal Medicine

## 2014-02-23 NOTE — Assessment & Plan Note (Signed)
Hgb just checked and wnl - 12.9.  Has had GI w/up.  Decrease iron to twice weekly.  Follow cbc.

## 2014-02-23 NOTE — Assessment & Plan Note (Signed)
Continue to stay hydrated.  Follow renal function.

## 2014-02-23 NOTE — Assessment & Plan Note (Signed)
Blood pressure doing well.  Same medication regiment.  Follow metabolic panel.

## 2014-02-23 NOTE — Assessment & Plan Note (Signed)
Still with some increased stress.  Feels she is handling things relatively well.  On zoloft.  Feels helping.  Follow.

## 2014-02-23 NOTE — Assessment & Plan Note (Signed)
EGD as outlined.  Had f/u EGD 07/15/13 - ulcer healed.  Still with gastritis and hiatal hernia.  Continue on protonix.  Symptoms controlled.

## 2014-02-23 NOTE — Assessment & Plan Note (Signed)
On plavix.  Doing well.  

## 2014-02-23 NOTE — Assessment & Plan Note (Signed)
On simvastatin.  Follow lipid panel and liver function tests.  Lab Results  Component Value Date   CHOL 187 02/19/2014   HDL 62.30 02/19/2014   LDLCALC 93 02/19/2014   TRIG 158.0* 02/19/2014   CHOLHDL 3 02/19/2014

## 2014-05-19 ENCOUNTER — Ambulatory Visit: Admit: 2014-05-19 | Disposition: A | Discharge status: Home / Self Care | Payer: Self-pay | Admitting: Internal Medicine

## 2014-05-19 DIAGNOSIS — Z1231 Encounter for screening mammogram for malignant neoplasm of breast: Secondary | ICD-10-CM | POA: Diagnosis not present

## 2014-05-19 LAB — HM MAMMOGRAPHY: HM Mammogram: NEGATIVE

## 2014-05-20 ENCOUNTER — Encounter: Payer: Self-pay | Admitting: *Deleted

## 2014-06-24 ENCOUNTER — Other Ambulatory Visit (INDEPENDENT_AMBULATORY_CARE_PROVIDER_SITE_OTHER): Payer: Medicare Other

## 2014-06-24 ENCOUNTER — Other Ambulatory Visit: Payer: Medicare Other

## 2014-06-24 DIAGNOSIS — E7800 Pure hypercholesterolemia, unspecified: Secondary | ICD-10-CM

## 2014-06-24 DIAGNOSIS — D649 Anemia, unspecified: Secondary | ICD-10-CM

## 2014-06-24 DIAGNOSIS — I10 Essential (primary) hypertension: Secondary | ICD-10-CM

## 2014-06-24 LAB — HEPATIC FUNCTION PANEL
ALT: 10 U/L (ref 0–35)
AST: 23 U/L (ref 0–37)
Albumin: 4.2 g/dL (ref 3.5–5.2)
Alkaline Phosphatase: 67 U/L (ref 39–117)
BILIRUBIN DIRECT: 0.1 mg/dL (ref 0.0–0.3)
BILIRUBIN TOTAL: 0.5 mg/dL (ref 0.2–1.2)
TOTAL PROTEIN: 6.8 g/dL (ref 6.0–8.3)

## 2014-06-24 LAB — CBC WITH DIFFERENTIAL/PLATELET
Basophils Absolute: 0 10*3/uL (ref 0.0–0.1)
Basophils Relative: 0.8 % (ref 0.0–3.0)
EOS ABS: 0.1 10*3/uL (ref 0.0–0.7)
EOS PCT: 1.1 % (ref 0.0–5.0)
HCT: 38.5 % (ref 36.0–46.0)
Hemoglobin: 13.1 g/dL (ref 12.0–15.0)
LYMPHS ABS: 1.3 10*3/uL (ref 0.7–4.0)
Lymphocytes Relative: 28.5 % (ref 12.0–46.0)
MCHC: 34 g/dL (ref 30.0–36.0)
MCV: 92.8 fl (ref 78.0–100.0)
MONO ABS: 0.4 10*3/uL (ref 0.1–1.0)
Monocytes Relative: 7.7 % (ref 3.0–12.0)
Neutro Abs: 2.9 10*3/uL (ref 1.4–7.7)
Neutrophils Relative %: 61.9 % (ref 43.0–77.0)
Platelets: 181 10*3/uL (ref 150.0–400.0)
RBC: 4.15 Mil/uL (ref 3.87–5.11)
RDW: 14.1 % (ref 11.5–15.5)
WBC: 4.6 10*3/uL (ref 4.0–10.5)

## 2014-06-24 LAB — BASIC METABOLIC PANEL
BUN: 20 mg/dL (ref 6–23)
CALCIUM: 9.4 mg/dL (ref 8.4–10.5)
CO2: 30 mEq/L (ref 19–32)
Chloride: 104 mEq/L (ref 96–112)
Creatinine, Ser: 1.1 mg/dL (ref 0.40–1.20)
GFR: 50.14 mL/min — ABNORMAL LOW (ref >60.00)
GLUCOSE: 101 mg/dL — AB (ref 70–99)
Potassium: 4.2 mEq/L (ref 3.5–5.1)
SODIUM: 139 meq/L (ref 135–145)

## 2014-06-24 LAB — LIPID PANEL
CHOL/HDL RATIO: 3
Cholesterol: 185 mg/dL (ref 0–200)
HDL: 69.2 mg/dL (ref >39.00)
LDL Cholesterol: 94 mg/dL (ref 0–99)
NONHDL: 115.8
Triglycerides: 111 mg/dL (ref 0.0–149.0)
VLDL: 22.2 mg/dL (ref 0.0–40.0)

## 2014-06-24 LAB — FERRITIN: FERRITIN: 38.4 ng/mL (ref 10.0–291.0)

## 2014-06-27 ENCOUNTER — Ambulatory Visit (INDEPENDENT_AMBULATORY_CARE_PROVIDER_SITE_OTHER): Payer: Medicare Other | Admitting: Internal Medicine

## 2014-06-27 ENCOUNTER — Encounter: Payer: Self-pay | Admitting: Internal Medicine

## 2014-06-27 VITALS — BP 114/59 | HR 58 | Temp 98.2°F | Ht 61.2 in | Wt 122.5 lb

## 2014-06-27 DIAGNOSIS — E7800 Pure hypercholesterolemia, unspecified: Secondary | ICD-10-CM

## 2014-06-27 DIAGNOSIS — F439 Reaction to severe stress, unspecified: Secondary | ICD-10-CM

## 2014-06-27 DIAGNOSIS — K253 Acute gastric ulcer without hemorrhage or perforation: Secondary | ICD-10-CM | POA: Diagnosis not present

## 2014-06-27 DIAGNOSIS — N289 Disorder of kidney and ureter, unspecified: Secondary | ICD-10-CM | POA: Diagnosis not present

## 2014-06-27 DIAGNOSIS — I639 Cerebral infarction, unspecified: Secondary | ICD-10-CM

## 2014-06-27 DIAGNOSIS — Z658 Other specified problems related to psychosocial circumstances: Secondary | ICD-10-CM

## 2014-06-27 DIAGNOSIS — I10 Essential (primary) hypertension: Secondary | ICD-10-CM

## 2014-06-27 DIAGNOSIS — W19XXXA Unspecified fall, initial encounter: Secondary | ICD-10-CM

## 2014-06-27 MED ORDER — CLOPIDOGREL BISULFATE 75 MG PO TABS: 75.0000 mg | ORAL_TABLET | Freq: Every day | ORAL | Status: DC

## 2014-06-27 NOTE — Progress Notes (Signed)
Pre visit review using our clinic review tool, if applicable. No additional management support is needed unless otherwise documented below in the visit note. 

## 2014-06-27 NOTE — Progress Notes (Signed)
Patient ID: Rachel Martinez, female   DOB: 05-24-1927, 81 y.o.   MRN: [id number]   Subjective:    Patient ID: Rachel Martinez, female    DOB: 08/20/1927, 82 y.o.   MRN: [id number]  HPI  Patient here for a scheduled follow up.  Increased stress with her husband's medical issues.  He is in rehab now.  Doing some better.  Tries to stay active.  States eating regular meals.  No cardiac symptoms with increased activity or exertion.  She had a previous fall.  Tripped over her deck.  Landed forward.  Did not injure herself.  No residual problems.  Overall denies any unsteadiness, etc.  Blood pressure doing well.    Past Medical History  Diagnosis Date  . Heart palpitations   . Hypercholesteremia   . Hypertension   . CVA (cerebral vascular accident) 8/06  . Chicken pox   . Stroke     Current Outpatient Prescriptions on File Prior to Visit  Medication Sig Dispense Refill  . calcium carbonate (TUMS - DOSED IN MG ELEMENTAL CALCIUM) 500 MG chewable tablet Chew 1 tablet by mouth 3 (three) times daily as needed.     . Cyanocobalamin (B-12 PO) Take by mouth.    . diltiazem (TIAZAC) 120 MG 24 hr capsule Take 1 capsule (120 mg total) by mouth daily. 30 capsule 5  . lisinopril (PRINIVIL,ZESTRIL) 10 MG tablet Take 1 tablet (10 mg total) by mouth daily. 30 tablet 5  . pravastatin (PRAVACHOL) 40 MG tablet Take 1 tablet (40 mg total) by mouth daily. 30 tablet 5  . sertraline (ZOLOFT) 50 MG tablet Take 1 tablet (50 mg total) by mouth daily. 30 tablet 3   No current facility-administered medications on file prior to visit.    Review of Systems  Constitutional: Negative for appetite change and unexpected weight change.  HENT: Negative for congestion and sinus pressure.   Respiratory: Negative for cough, chest tightness and shortness of breath.   Cardiovascular: Negative for chest pain, palpitations and leg swelling.  Gastrointestinal: Negative for nausea, vomiting, abdominal pain and diarrhea.  Skin:  Negative for color change and rash.  Neurological: Negative for dizziness, light-headedness and headaches.  Psychiatric/Behavioral: Negative for dysphoric mood and agitation.       Increased stress as outlined.         Objective:     Blood pressure recheck:  132-134/62, pulse 60  Physical Exam  Constitutional: She appears well-developed and well-nourished. No distress.  HENT:  Nose: Nose normal.  Mouth/Throat: Oropharynx is clear and moist.  Neck: Neck supple. No thyromegaly present.  Cardiovascular: Normal rate and regular rhythm.   Pulmonary/Chest: Breath sounds normal. No respiratory distress. She has no wheezes.  Abdominal: Soft. Bowel sounds are normal. There is no tenderness.  Musculoskeletal: She exhibits no edema or tenderness.  Lymphadenopathy:    She has no cervical adenopathy.  Skin: No rash noted. No erythema.  Psychiatric: She has a normal mood and affect. Her behavior is normal.    BP 114/59 mmHg  Pulse 58  Temp(Src) 98.2 F (36.8 C) (Oral)  Ht 5' 1.2" (1.554 m)  Wt 122 lb 8 oz (55.566 kg)  BMI 23.01 kg/m2  SpO2 96% Wt Readings from Last 3 Encounters:  06/27/14 122 lb 8 oz (55.566 kg)  02/21/14 123 lb 12 oz (56.133 kg)  11/20/13 121 lb 8 oz (55.112 kg)     Lab Results  Component Value Date   WBC 4.6 06/24/2014   HGB  13.1 06/24/2014   HCT 38.5 06/24/2014   PLT 181.0 06/24/2014   GLUCOSE 101* 06/24/2014   CHOL 185 06/24/2014   TRIG 111.0 06/24/2014   HDL 69.20 06/24/2014   LDLCALC 94 06/24/2014   ALT 10 06/24/2014   AST 23 06/24/2014   NA 139 06/24/2014   K 4.2 06/24/2014   CL 104 06/24/2014   CREATININE 1.10 06/24/2014   BUN 20 06/24/2014   CO2 30 06/24/2014   TSH 2.22 02/19/2014    Mm Screening Breast Tomo Bilateral  05/19/2014   CLINICAL DATA:  Screening.  EXAM: DIGITAL SCREENING BILATERAL MAMMOGRAM WITH 3D TOMO WITH CAD  COMPARISON:  Previous exam(s).  ACR Breast Density Category b: There are scattered areas of fibroglandular density.   FINDINGS: There are no findings suspicious for malignancy. Images were processed with CAD.  IMPRESSION: No mammographic evidence of malignancy. A result letter of this screening mammogram will be mailed directly to the patient.  RECOMMENDATION: Screening mammogram in one year. (Code:[id number]-B-01Y)  BI-RADS CATEGORY  1: Negative.   Electronically Signed   By: Altamese Cabal M.D.   On: 05/19/2014 11:23       Assessment & Plan:   Problem List Items Addressed This Visit    CVA (cerebral vascular accident) - Primary    On plavix.  Doing well.        Fall    Recent fall.  No injury.  Discussed slow position changes and movements.  Follow.        Gastric ulcer    EGD as outlined.  Currently asymptomatic.  Recent hgb wnl.  Follow.       Hypercholesterolemia    On simvastatin.  Low cholesterol diet and exercise.  Follow lipid panel and liver function tests.   Lab Results  Component Value Date   CHOL 185 06/24/2014   HDL 69.20 06/24/2014   LDLCALC 94 06/24/2014   TRIG 111.0 06/24/2014   CHOLHDL 3 06/24/2014        Hypertension    Blood pressure doing well.  Same medication regimen.  Follow pressures.  Follow metabolic panel.       Renal insufficiency    Cr just checked - 1.1.  Follow.       Stress    Increased stress as outlined.  Desires no further intervention.  Follow.            Einar Pheasant, MD

## 2014-06-29 ENCOUNTER — Encounter: Payer: Self-pay | Admitting: Internal Medicine

## 2014-06-29 DIAGNOSIS — W19XXXA Unspecified fall, initial encounter: Secondary | ICD-10-CM | POA: Insufficient documentation

## 2014-06-29 DIAGNOSIS — R296 Repeated falls: Secondary | ICD-10-CM | POA: Insufficient documentation

## 2014-06-29 NOTE — Assessment & Plan Note (Signed)
Increased stress as outlined.  Desires no further intervention.  Follow.   

## 2014-06-29 NOTE — Assessment & Plan Note (Signed)
Recent fall.  No injury.  Discussed slow position changes and movements.  Follow.

## 2014-06-29 NOTE — Assessment & Plan Note (Signed)
Cr just checked - 1.1.  Follow.

## 2014-06-29 NOTE — Assessment & Plan Note (Signed)
Blood pressure doing well.  Same medication regimen.  Follow pressures.  Follow metabolic panel.   

## 2014-06-29 NOTE — Assessment & Plan Note (Signed)
On simvastatin.  Low cholesterol diet and exercise.  Follow lipid panel and liver function tests.   Lab Results  Component Value Date   CHOL 185 06/24/2014   HDL 69.20 06/24/2014   LDLCALC 94 06/24/2014   TRIG 111.0 06/24/2014   CHOLHDL 3 06/24/2014

## 2014-06-29 NOTE — Assessment & Plan Note (Signed)
EGD as outlined.  Currently asymptomatic.  Recent hgb wnl.  Follow.

## 2014-06-29 NOTE — Assessment & Plan Note (Signed)
On plavix.  Doing well.  

## 2014-08-15 ENCOUNTER — Other Ambulatory Visit: Payer: Self-pay

## 2014-08-15 MED ORDER — DILTIAZEM HCL ER BEADS 120 MG PO CP24: 120.0000 mg | ORAL_CAPSULE | Freq: Every day | ORAL | Status: DC

## 2014-09-23 ENCOUNTER — Telehealth: Payer: Self-pay | Admitting: *Deleted

## 2014-09-23 MED ORDER — SERTRALINE HCL 50 MG PO TABS: 50.0000 mg | ORAL_TABLET | Freq: Every day | ORAL | Status: DC

## 2014-09-23 NOTE — Telephone Encounter (Signed)
ok'd rx for sertraline 50mg  #30 with 2 refills.

## 2014-09-23 NOTE — Telephone Encounter (Signed)
Fax from pharmacy requesting Sertraline refill.  Last OV 6.3.16.  Please advise refill

## 2014-10-13 ENCOUNTER — Other Ambulatory Visit: Payer: Self-pay | Admitting: Internal Medicine

## 2014-10-27 ENCOUNTER — Encounter: Payer: Medicare Other | Admitting: Internal Medicine

## 2014-10-27 ENCOUNTER — Telehealth: Payer: Self-pay | Admitting: Internal Medicine

## 2014-10-27 NOTE — Telephone Encounter (Signed)
Thanks, per Dr. Nicki Reaper please cancel her appointment so she does not get a no show fee.

## 2014-10-27 NOTE — Telephone Encounter (Signed)
FYI, pt will not be able to make her appt today @3p , due to her husband going to the ER pt states her husband may have had a stroke. Thank You!

## 2015-03-09 ENCOUNTER — Other Ambulatory Visit: Payer: Self-pay | Admitting: Internal Medicine

## 2015-04-07 ENCOUNTER — Other Ambulatory Visit: Payer: Self-pay | Admitting: Internal Medicine

## 2015-05-25 ENCOUNTER — Ambulatory Visit (INDEPENDENT_AMBULATORY_CARE_PROVIDER_SITE_OTHER): Payer: Medicare Other | Admitting: Internal Medicine

## 2015-05-25 ENCOUNTER — Encounter: Payer: Self-pay | Admitting: Internal Medicine

## 2015-05-25 VITALS — BP 120/70 | HR 62 | Temp 98.4°F | Resp 18 | Ht 61.75 in | Wt 124.5 lb

## 2015-05-25 DIAGNOSIS — E7800 Pure hypercholesterolemia, unspecified: Secondary | ICD-10-CM

## 2015-05-25 DIAGNOSIS — Z658 Other specified problems related to psychosocial circumstances: Secondary | ICD-10-CM

## 2015-05-25 DIAGNOSIS — N289 Disorder of kidney and ureter, unspecified: Secondary | ICD-10-CM | POA: Diagnosis not present

## 2015-05-25 DIAGNOSIS — I10 Essential (primary) hypertension: Secondary | ICD-10-CM

## 2015-05-25 DIAGNOSIS — I639 Cerebral infarction, unspecified: Secondary | ICD-10-CM | POA: Diagnosis not present

## 2015-05-25 DIAGNOSIS — F439 Reaction to severe stress, unspecified: Secondary | ICD-10-CM

## 2015-05-25 DIAGNOSIS — Z0000 Encounter for general adult medical examination without abnormal findings: Secondary | ICD-10-CM | POA: Diagnosis not present

## 2015-05-25 DIAGNOSIS — D649 Anemia, unspecified: Secondary | ICD-10-CM

## 2015-05-25 NOTE — Progress Notes (Signed)
Pre-visit discussion using our clinic review tool. No additional management support is needed unless otherwise documented below in the visit note.  

## 2015-05-25 NOTE — Assessment & Plan Note (Signed)
Physical today 05/25/15.  Declines mammogram right now.

## 2015-05-25 NOTE — Progress Notes (Signed)
Patient ID: Rachel Martinez, female   DOB: 1927-05-08, 82 y.o.   MRN: [id number]   Subjective:    Patient ID: Rachel Martinez, female    DOB: 07/13/1927, 82 y.o.   MRN: [id number]  HPI  Patient here for her physical exam.  She is under increased stress with her husband's medical issues.  We discussed this today.  She is accompanied by her sister-n-law.  History obtained from both of them.  She is eating.  Not watching her diet.  No abdominal pain or cramping.  Bowels stable.  No urine change.    Past Medical History  Diagnosis Date  . Heart palpitations   . Hypercholesteremia   . Hypertension   . CVA (cerebral vascular accident) ([place]) 8/06  . Chicken pox   . Stroke [facility])    Past Surgical History  Procedure Laterality Date  . Appendectomy     Family History  Problem Relation Age of Onset  . Diabetes Father   . Heart disease Father   . CVA Mother   . Leukemia Brother   . Lung disease Brother   . Hypertension Sister   . Mental illness Brother     suicide  . Breast cancer Paternal Aunt    Social History   Social History  . Marital Status: Married    Spouse Name: N/A  . Number of Children: N/A  . Years of Education: N/A   Social History Main Topics  . Smoking status: Former Research scientist (life sciences)  . Smokeless tobacco: Never Used  . Alcohol Use: No  . Drug Use: No  . Sexual Activity: Not Asked   Other Topics Concern  . None   Social History Narrative    Outpatient Encounter Prescriptions as of 05/25/2015  Medication Sig  . calcium carbonate (TUMS - DOSED IN MG ELEMENTAL CALCIUM) 500 MG chewable tablet Chew 1 tablet by mouth 3 (three) times daily as needed.   . clopidogrel (PLAVIX) 75 MG tablet take 1 tablet by mouth once daily  . Cyanocobalamin (B-12 PO) Take by mouth.  Marland Kitchen lisinopril (PRINIVIL,ZESTRIL) 10 MG tablet Take 1 tablet (10 mg total) by mouth daily.  . pravastatin (PRAVACHOL) 40 MG tablet take 1 tablet by mouth once daily  . sertraline (ZOLOFT) 50 MG tablet take 1 tablet  by mouth once daily  . TAZTIA XT 120 MG 24 hr capsule take 1 capsule by mouth once daily   No facility-administered encounter medications on file as of 05/25/2015.    Review of Systems  Constitutional: Negative for appetite change and unexpected weight change.  HENT: Negative for congestion and sinus pressure.   Eyes: Negative for pain and visual disturbance.  Respiratory: Negative for cough, chest tightness and shortness of breath.   Cardiovascular: Negative for chest pain, palpitations and leg swelling.  Gastrointestinal: Negative for nausea, vomiting, abdominal pain and diarrhea.  Genitourinary: Negative for dysuria and difficulty urinating.  Musculoskeletal: Negative for back pain and joint swelling.  Skin: Negative for color change and rash.  Neurological: Negative for dizziness, light-headedness and headaches.  Hematological: Negative for adenopathy. Does not bruise/bleed easily.  Psychiatric/Behavioral: Negative for dysphoric mood and agitation.       Increased stress as outlined.        Objective:     Blood pressure rechecked by me:  122/62  Physical Exam  Constitutional: She is oriented to person, place, and time. She appears well-developed and well-nourished. No distress.  HENT:  Nose: Nose normal.  Mouth/Throat: Oropharynx is clear and  moist.  Eyes: Right eye exhibits no discharge. Left eye exhibits no discharge. No scleral icterus.  Neck: Neck supple. No thyromegaly present.  Cardiovascular: Normal rate and regular rhythm.   Pulmonary/Chest: Breath sounds normal. No accessory muscle usage. No tachypnea. No respiratory distress. She has no decreased breath sounds. She has no wheezes. She has no rhonchi. Right breast exhibits no inverted nipple, no mass, no nipple discharge and no tenderness (no axillary adenopathy). Left breast exhibits no inverted nipple, no mass, no nipple discharge and no tenderness (no axilarry adenopathy).  Abdominal: Soft. Bowel sounds are normal.  There is no tenderness.  Musculoskeletal: She exhibits no edema or tenderness.  Lymphadenopathy:    She has no cervical adenopathy.  Neurological: She is alert and oriented to person, place, and time.  Skin: Skin is warm. No rash noted. No erythema.  Psychiatric: She has a normal mood and affect. Her behavior is normal.    BP 120/70 mmHg  Pulse 62  Temp(Src) 98.4 F (36.9 C) (Oral)  Resp 18  Ht 5' 1.75" (1.568 m)  Wt 124 lb 8 oz (56.473 kg)  BMI 22.97 kg/m2  SpO2 98% Wt Readings from Last 3 Encounters:  05/25/15 124 lb 8 oz (56.473 kg)  06/27/14 122 lb 8 oz (55.566 kg)  02/21/14 123 lb 12 oz (56.133 kg)     Lab Results  Component Value Date   WBC 4.6 06/24/2014   HGB 13.1 06/24/2014   HCT 38.5 06/24/2014   PLT 181.0 06/24/2014   GLUCOSE 101* 06/24/2014   CHOL 185 06/24/2014   TRIG 111.0 06/24/2014   HDL 69.20 06/24/2014   LDLCALC 94 06/24/2014   ALT 10 06/24/2014   AST 23 06/24/2014   NA 139 06/24/2014   K 4.2 06/24/2014   CL 104 06/24/2014   CREATININE 1.10 06/24/2014   BUN 20 06/24/2014   CO2 30 06/24/2014   TSH 2.22 02/19/2014    Mm Screening Breast Tomo Bilateral  05/19/2014  CLINICAL DATA:  Screening. EXAM: DIGITAL SCREENING BILATERAL MAMMOGRAM WITH 3D TOMO WITH CAD COMPARISON:  Previous exam(s). ACR Breast Density Category b: There are scattered areas of fibroglandular density. FINDINGS: There are no findings suspicious for malignancy. Images were processed with CAD. IMPRESSION: No mammographic evidence of malignancy. A result letter of this screening mammogram will be mailed directly to the patient. RECOMMENDATION: Screening mammogram in one year. (Code:SM-B-01Y) BI-RADS CATEGORY  1: Negative. Electronically Signed   By: Altamese Cabal M.D.   On: 05/19/2014 11:23       Assessment & Plan:   Problem List Items Addressed This Visit    Anemia    Last hgb check wnl.  Recheck cbc.       Relevant Orders   CBC with Differential/Platelet   Ferritin   CVA  (cerebral vascular accident) ([place])    On plavix and doing well.  Follow.        Health care maintenance    Physical today 05/25/15.  Declines mammogram right now.        Hypercholesterolemia    On pravastatin.  Low cholesterol diet and exercise.  Follow lipid panel and liver function tests.        Relevant Orders   Lipid panel   Hepatic function panel   Hypertension    Blood pressure under good control.  Continue same medication regimen.  Follow pressures.  Follow metabolic panel.        Relevant Orders   TSH   Basic metabolic panel  Renal insufficiency    Creatinine last checked 1.1.  Stay hydrated.  Follow met b.       Stress    Increased stress as outlined.  Discussed with her today.  On zoloft.  Follow.         Other Visit Diagnoses    Routine general medical examination at a health care facility    -  Primary        Einar Pheasant, MD

## 2015-05-31 ENCOUNTER — Encounter: Payer: Self-pay | Admitting: Internal Medicine

## 2015-05-31 NOTE — Assessment & Plan Note (Signed)
Creatinine last checked 1.1.  Stay hydrated.  Follow met b.

## 2015-05-31 NOTE — Assessment & Plan Note (Signed)
Last hgb check wnl.  Recheck cbc.

## 2015-05-31 NOTE — Assessment & Plan Note (Signed)
Blood pressure under good control.  Continue same medication regimen.  Follow pressures.  Follow metabolic panel.   

## 2015-05-31 NOTE — Assessment & Plan Note (Signed)
Increased stress as outlined.  Discussed with her today.  On zoloft.  Follow.

## 2015-05-31 NOTE — Assessment & Plan Note (Signed)
On plavix and doing well.  Follow.   

## 2015-05-31 NOTE — Assessment & Plan Note (Signed)
On pravastatin.  Low cholesterol diet and exercise.  Follow lipid panel and liver function tests.   

## 2015-06-01 ENCOUNTER — Other Ambulatory Visit: Payer: Self-pay | Admitting: Internal Medicine

## 2015-06-01 ENCOUNTER — Other Ambulatory Visit (INDEPENDENT_AMBULATORY_CARE_PROVIDER_SITE_OTHER): Payer: Medicare Other

## 2015-06-01 DIAGNOSIS — E7800 Pure hypercholesterolemia, unspecified: Secondary | ICD-10-CM | POA: Diagnosis not present

## 2015-06-01 DIAGNOSIS — D649 Anemia, unspecified: Secondary | ICD-10-CM

## 2015-06-01 DIAGNOSIS — I10 Essential (primary) hypertension: Secondary | ICD-10-CM

## 2015-06-01 LAB — CBC WITH DIFFERENTIAL/PLATELET
BASOS PCT: 0.6 % (ref 0.0–3.0)
Basophils Absolute: 0 10*3/uL (ref 0.0–0.1)
EOS ABS: 0.1 10*3/uL (ref 0.0–0.7)
Eosinophils Relative: 1.5 % (ref 0.0–5.0)
HEMATOCRIT: 35.3 % — AB (ref 36.0–46.0)
HEMOGLOBIN: 11.7 g/dL — AB (ref 12.0–15.0)
LYMPHS PCT: 28.5 % (ref 12.0–46.0)
Lymphs Abs: 1.5 10*3/uL (ref 0.7–4.0)
MCHC: 33.2 g/dL (ref 30.0–36.0)
MCV: 89.7 fl (ref 78.0–100.0)
Monocytes Absolute: 0.4 10*3/uL (ref 0.1–1.0)
Monocytes Relative: 8.1 % (ref 3.0–12.0)
Neutro Abs: 3.2 10*3/uL (ref 1.4–7.7)
Neutrophils Relative %: 61.3 % (ref 43.0–77.0)
Platelets: 212 10*3/uL (ref 150.0–400.0)
RBC: 3.94 Mil/uL (ref 3.87–5.11)
RDW: 14.5 % (ref 11.5–15.5)
WBC: 5.3 10*3/uL (ref 4.0–10.5)

## 2015-06-01 LAB — BASIC METABOLIC PANEL
BUN: 27 mg/dL — ABNORMAL HIGH (ref 6–23)
CALCIUM: 8.9 mg/dL (ref 8.4–10.5)
CO2: 30 meq/L (ref 19–32)
CREATININE: 1.04 mg/dL (ref 0.40–1.20)
Chloride: 105 mEq/L (ref 96–112)
GFR: 53.37 mL/min — ABNORMAL LOW (ref >60.00)
GLUCOSE: 89 mg/dL (ref 70–99)
Potassium: 4.2 mEq/L (ref 3.5–5.1)
Sodium: 141 mEq/L (ref 135–145)

## 2015-06-01 LAB — LIPID PANEL
Cholesterol: 167 mg/dL (ref 0–200)
HDL: 64 mg/dL (ref >39.00)
LDL Cholesterol: 84 mg/dL (ref 0–99)
NonHDL: 103.19
Total CHOL/HDL Ratio: 3
Triglycerides: 96 mg/dL (ref 0.0–149.0)
VLDL: 19.2 mg/dL (ref 0.0–40.0)

## 2015-06-01 LAB — HEPATIC FUNCTION PANEL
ALT: 9 U/L (ref 0–35)
AST: 21 U/L (ref 0–37)
Albumin: 3.7 g/dL (ref 3.5–5.2)
Alkaline Phosphatase: 57 U/L (ref 39–117)
BILIRUBIN DIRECT: 0.1 mg/dL (ref 0.0–0.3)
BILIRUBIN TOTAL: 0.4 mg/dL (ref 0.2–1.2)
TOTAL PROTEIN: 5.7 g/dL — AB (ref 6.0–8.3)

## 2015-06-01 LAB — FERRITIN: Ferritin: 19.2 ng/mL (ref 10.0–291.0)

## 2015-06-01 LAB — TSH: TSH: 1.75 u[IU]/mL (ref 0.35–4.50)

## 2015-06-01 NOTE — Progress Notes (Signed)
Order placed for f/u labs.  

## 2015-06-08 ENCOUNTER — Other Ambulatory Visit (INDEPENDENT_AMBULATORY_CARE_PROVIDER_SITE_OTHER): Payer: Medicare Other

## 2015-06-08 DIAGNOSIS — D649 Anemia, unspecified: Secondary | ICD-10-CM

## 2015-06-08 LAB — CBC WITH DIFFERENTIAL/PLATELET
BASOS ABS: 0 10*3/uL (ref 0.0–0.1)
Basophils Relative: 0.8 % (ref 0.0–3.0)
EOS PCT: 1 % (ref 0.0–5.0)
Eosinophils Absolute: 0 10*3/uL (ref 0.0–0.7)
HEMATOCRIT: 36 % (ref 36.0–46.0)
Hemoglobin: 12 g/dL (ref 12.0–15.0)
LYMPHS ABS: 1.3 10*3/uL (ref 0.7–4.0)
LYMPHS PCT: 27.5 % (ref 12.0–46.0)
MCHC: 33.4 g/dL (ref 30.0–36.0)
MCV: 89.3 fl (ref 78.0–100.0)
MONOS PCT: 6.5 % (ref 3.0–12.0)
Monocytes Absolute: 0.3 10*3/uL (ref 0.1–1.0)
Neutro Abs: 3.1 10*3/uL (ref 1.4–7.7)
Neutrophils Relative %: 64.2 % (ref 43.0–77.0)
Platelets: 210 10*3/uL (ref 150.0–400.0)
RBC: 4.03 Mil/uL (ref 3.87–5.11)
RDW: 14.8 % (ref 11.5–15.5)
WBC: 4.8 10*3/uL (ref 4.0–10.5)

## 2015-06-08 LAB — IBC PANEL
Iron: 50 ug/dL (ref 42–145)
SATURATION RATIOS: 13.1 % — AB (ref 20.0–50.0)
Transferrin: 273 mg/dL (ref 212.0–360.0)

## 2015-06-08 LAB — VITAMIN B12: VITAMIN B 12: 292 pg/mL (ref 211–911)

## 2015-06-09 ENCOUNTER — Encounter: Payer: Self-pay | Admitting: *Deleted

## 2015-07-09 ENCOUNTER — Other Ambulatory Visit: Payer: Self-pay | Admitting: Internal Medicine

## 2015-09-25 ENCOUNTER — Ambulatory Visit: Payer: Medicare Other | Admitting: Internal Medicine

## 2015-10-07 ENCOUNTER — Other Ambulatory Visit: Payer: Self-pay | Admitting: Internal Medicine

## 2015-10-29 ENCOUNTER — Ambulatory Visit (INDEPENDENT_AMBULATORY_CARE_PROVIDER_SITE_OTHER): Payer: Medicare Other | Admitting: Internal Medicine

## 2015-10-29 ENCOUNTER — Encounter: Payer: Self-pay | Admitting: Internal Medicine

## 2015-10-29 DIAGNOSIS — E7800 Pure hypercholesterolemia, unspecified: Secondary | ICD-10-CM

## 2015-10-29 DIAGNOSIS — I10 Essential (primary) hypertension: Secondary | ICD-10-CM

## 2015-10-29 DIAGNOSIS — I639 Cerebral infarction, unspecified: Secondary | ICD-10-CM | POA: Diagnosis not present

## 2015-10-29 DIAGNOSIS — F439 Reaction to severe stress, unspecified: Secondary | ICD-10-CM

## 2015-10-29 DIAGNOSIS — Z23 Encounter for immunization: Secondary | ICD-10-CM

## 2015-10-29 NOTE — Progress Notes (Signed)
Patient ID: Rachel Martinez, female   DOB: July 08, 1927, 82 y.o.   MRN: [id number]   Subjective:    Patient ID: Rachel Martinez, female    DOB: 08/17/1927, 82 y.o.   MRN: [id number]  HPI  Patient here for a scheduled follow up.  She states she is doing relatively well.  Is under a lot of stress with her husband's health issues.  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  She stays active.  No chest pain.  Breathing stable.  Eating and drinking.  No nausea or vomiting.  Bowels stable.  Overall she feels she is handling the stress ok.  Does not feel she needs any further intervention.     Past Medical History:  Diagnosis Date  . Chicken pox   . CVA (cerebral vascular accident) ([place]) 8/06  . Heart palpitations   . Hypercholesteremia   . Hypertension   . Stroke [facility])    Past Surgical History:  Procedure Laterality Date  . APPENDECTOMY     Family History  Problem Relation Age of Onset  . CVA Mother   . Diabetes Father   . Heart disease Father   . Leukemia Brother   . Lung disease Brother   . Hypertension Sister   . Mental illness Brother     suicide  . Breast cancer Paternal Aunt    Social History   Social History  . Marital status: Married    Spouse name: N/A  . Number of children: N/A  . Years of education: N/A   Social History Main Topics  . Smoking status: Former Research scientist (life sciences)  . Smokeless tobacco: Never Used  . Alcohol use No  . Drug use: No  . Sexual activity: Not Asked   Other Topics Concern  . None   Social History Narrative  . None    Outpatient Encounter Prescriptions as of 10/29/2015  Medication Sig  . calcium carbonate (TUMS - DOSED IN MG ELEMENTAL CALCIUM) 500 MG chewable tablet Chew 1 tablet by mouth 3 (three) times daily as needed.   . clopidogrel (PLAVIX) 75 MG tablet take 1 tablet by mouth once daily  . Cyanocobalamin (B-12 PO) Take by mouth.  Marland Kitchen lisinopril (PRINIVIL,ZESTRIL) 10 MG tablet Take 1 tablet (10 mg total) by mouth daily.  . pravastatin (PRAVACHOL)  40 MG tablet take 1 tablet by mouth once daily  . sertraline (ZOLOFT) 50 MG tablet take 1 tablet by mouth once daily  . TAZTIA XT 120 MG 24 hr capsule take 1 capsule by mouth once daily   No facility-administered encounter medications on file as of 10/29/2015.     Review of Systems  Constitutional: Negative for appetite change and unexpected weight change.  HENT: Negative for congestion and sinus pressure.   Respiratory: Negative for cough, chest tightness and shortness of breath.   Cardiovascular: Negative for chest pain, palpitations and leg swelling.  Gastrointestinal: Negative for abdominal pain, diarrhea, nausea and vomiting.  Genitourinary: Negative for difficulty urinating and dysuria.  Musculoskeletal: Negative for joint swelling and myalgias.  Skin: Negative for color change and rash.  Neurological: Negative for dizziness, light-headedness and headaches.  Psychiatric/Behavioral: Negative for agitation and dysphoric mood.       Increased stress as outlined.        Objective:    Physical Exam  Constitutional: She appears well-developed and well-nourished. No distress.  HENT:  Nose: Nose normal.  Mouth/Throat: Oropharynx is clear and moist.  Neck: Neck supple. No thyromegaly present.  Cardiovascular: Normal  rate and regular rhythm.   Pulmonary/Chest: Breath sounds normal. No respiratory distress. She has no wheezes.  Abdominal: Soft. Bowel sounds are normal. There is no tenderness.  Musculoskeletal: She exhibits no edema or tenderness.  Lymphadenopathy:    She has no cervical adenopathy.  Skin: No rash noted. No erythema.  Psychiatric: She has a normal mood and affect. Her behavior is normal.    BP 128/60   Pulse (!) 59   Temp 98.7 F (37.1 C) (Oral)   Ht 5\' 2"  (1.575 m)   Wt 123 lb (55.8 kg)   SpO2 97%   BMI 22.50 kg/m  Wt Readings from Last 3 Encounters:  10/29/15 123 lb (55.8 kg)  05/25/15 124 lb 8 oz (56.5 kg)  06/27/14 122 lb 8 oz (55.6 kg)     Lab  Results  Component Value Date   WBC 4.8 06/08/2015   HGB 12.0 06/08/2015   HCT 36.0 06/08/2015   PLT 210.0 06/08/2015   GLUCOSE 89 06/01/2015   CHOL 167 06/01/2015   TRIG 96.0 06/01/2015   HDL 64.00 06/01/2015   LDLCALC 84 06/01/2015   ALT 9 06/01/2015   AST 21 06/01/2015   NA 141 06/01/2015   K 4.2 06/01/2015   CL 105 06/01/2015   CREATININE 1.04 06/01/2015   BUN 27 (H) 06/01/2015   CO2 30 06/01/2015   TSH 1.75 06/01/2015    Mm Screening Breast Tomo Bilateral  Result Date: 05/19/2014 CLINICAL DATA:  Screening. EXAM: DIGITAL SCREENING BILATERAL MAMMOGRAM WITH 3D TOMO WITH CAD COMPARISON:  Previous exam(s). ACR Breast Density Category b: There are scattered areas of fibroglandular density. FINDINGS: There are no findings suspicious for malignancy. Images were processed with CAD. IMPRESSION: No mammographic evidence of malignancy. A result letter of this screening mammogram will be mailed directly to the patient. RECOMMENDATION: Screening mammogram in one year. (Code:SM-B-01Y) BI-RADS CATEGORY  1: Negative. Electronically Signed   By: Altamese Cabal M.D.   On: 05/19/2014 11:23       Assessment & Plan:   Problem List Items Addressed This Visit    CVA (cerebral vascular accident) ([place])    Has done well on plavix.  Follow.       Hypercholesterolemia    On pravastatin.  Follow lipid panel and liver function tests.        Relevant Orders   Hepatic function panel   Lipid panel   Hypertension    Blood pressure under good control.  Continue same medication regimen.  Follow pressures.  Follow metabolic panel.        Relevant Orders   Basic metabolic panel   Stress    Increased stress as outlined.  Discussed with her today.  On zoloft.  Desires no further intervention.  Follow.         Other Visit Diagnoses    Encounter for immunization       Relevant Orders   Flu vaccine HIGH DOSE PF (Completed)       Einar Pheasant, MD

## 2015-10-29 NOTE — Progress Notes (Signed)
Pre visit review using our clinic review tool, if applicable. No additional management support is needed unless otherwise documented below in the visit note. 

## 2015-11-03 ENCOUNTER — Encounter: Payer: Self-pay | Admitting: Internal Medicine

## 2015-11-03 NOTE — Assessment & Plan Note (Signed)
Blood pressure under good control.  Continue same medication regimen.  Follow pressures.  Follow metabolic panel.   

## 2015-11-03 NOTE — Assessment & Plan Note (Signed)
On pravastatin.  Follow lipid panel and liver function tests.   

## 2015-11-03 NOTE — Assessment & Plan Note (Signed)
Increased stress as outlined.  Discussed with her today.  On zoloft.  Desires no further intervention.  Follow.

## 2015-11-03 NOTE — Assessment & Plan Note (Signed)
Has done well on plavix.  Follow.   

## 2015-11-06 ENCOUNTER — Other Ambulatory Visit: Payer: Self-pay | Admitting: Internal Medicine

## 2016-01-22 ENCOUNTER — Ambulatory Visit: Payer: Medicare Other

## 2016-02-02 ENCOUNTER — Encounter: Payer: Self-pay | Admitting: Internal Medicine

## 2016-02-02 ENCOUNTER — Ambulatory Visit (INDEPENDENT_AMBULATORY_CARE_PROVIDER_SITE_OTHER): Payer: Medicare Other | Admitting: Internal Medicine

## 2016-02-02 DIAGNOSIS — I10 Essential (primary) hypertension: Secondary | ICD-10-CM | POA: Diagnosis not present

## 2016-02-02 DIAGNOSIS — E7800 Pure hypercholesterolemia, unspecified: Secondary | ICD-10-CM | POA: Diagnosis not present

## 2016-02-02 DIAGNOSIS — I639 Cerebral infarction, unspecified: Secondary | ICD-10-CM

## 2016-02-02 DIAGNOSIS — N289 Disorder of kidney and ureter, unspecified: Secondary | ICD-10-CM

## 2016-02-02 DIAGNOSIS — D649 Anemia, unspecified: Secondary | ICD-10-CM

## 2016-02-02 DIAGNOSIS — F439 Reaction to severe stress, unspecified: Secondary | ICD-10-CM

## 2016-02-02 LAB — LIPID PANEL
Cholesterol: 173 mg/dL (ref 0–200)
HDL: 66.9 mg/dL (ref >39.00)
LDL CALC: 80 mg/dL (ref 0–99)
NONHDL: 106.27
Total CHOL/HDL Ratio: 3
Triglycerides: 129 mg/dL (ref 0.0–149.0)
VLDL: 25.8 mg/dL (ref 0.0–40.0)

## 2016-02-02 LAB — HEPATIC FUNCTION PANEL
ALK PHOS: 70 U/L (ref 39–117)
ALT: 8 U/L (ref 0–35)
AST: 20 U/L (ref 0–37)
Albumin: 4 g/dL (ref 3.5–5.2)
BILIRUBIN TOTAL: 0.3 mg/dL (ref 0.2–1.2)
Bilirubin, Direct: 0.1 mg/dL (ref 0.0–0.3)
Total Protein: 6.3 g/dL (ref 6.0–8.3)

## 2016-02-02 LAB — BASIC METABOLIC PANEL
BUN: 20 mg/dL (ref 6–23)
CHLORIDE: 106 meq/L (ref 96–112)
CO2: 30 mEq/L (ref 19–32)
Calcium: 9.1 mg/dL (ref 8.4–10.5)
Creatinine, Ser: 1.01 mg/dL (ref 0.40–1.20)
GFR: 55.12 mL/min — AB (ref >60.00)
Glucose, Bld: 63 mg/dL — ABNORMAL LOW (ref 70–99)
POTASSIUM: 4.4 meq/L (ref 3.5–5.1)
SODIUM: 143 meq/L (ref 135–145)

## 2016-02-02 NOTE — Progress Notes (Signed)
Pre-visit discussion using our clinic review tool. No additional management support is needed unless otherwise documented below in the visit note.  

## 2016-02-02 NOTE — Progress Notes (Signed)
Patient ID: Rachel Martinez, female   DOB: 04-05-1927, 83 y.o.   MRN: [id number]   Subjective:    Patient ID: Rachel Martinez, female    DOB: 1927/04/04, 83 y.o.   MRN: [id number]  HPI  Patient here for a scheduled follow up.  Still with increased stress with her husband's health issues.  She is the primary caretaker.  Discussed at length with her today.  She does not feel needs any further intervention.  Stays active.  No chest pain.  No sob.  No acid reflux.  No abdominal pain.  Bowels stable.  States she is eating.  Weight stable.    Past Medical History:  Diagnosis Date  . Chicken pox   . CVA (cerebral vascular accident) ([place]) 8/06  . Heart palpitations   . Hypercholesteremia   . Hypertension   . Stroke [facility])    Past Surgical History:  Procedure Laterality Date  . APPENDECTOMY     Family History  Problem Relation Age of Onset  . CVA Mother   . Diabetes Father   . Heart disease Father   . Leukemia Brother   . Lung disease Brother   . Hypertension Sister   . Mental illness Brother     suicide  . Breast cancer Paternal Aunt    Social History   Social History  . Marital status: Married    Spouse name: N/A  . Number of children: N/A  . Years of education: N/A   Social History Main Topics  . Smoking status: Former Research scientist (life sciences)  . Smokeless tobacco: Never Used  . Alcohol use No  . Drug use: No  . Sexual activity: Not Asked   Other Topics Concern  . None   Social History Narrative  . None    Outpatient Encounter Prescriptions as of 02/02/2016  Medication Sig  . calcium carbonate (TUMS - DOSED IN MG ELEMENTAL CALCIUM) 500 MG chewable tablet Chew 1 tablet by mouth 3 (three) times daily as needed.   . clopidogrel (PLAVIX) 75 MG tablet take 1 tablet by mouth once daily  . Cyanocobalamin (B-12 PO) Take by mouth.  Marland Kitchen lisinopril (PRINIVIL,ZESTRIL) 10 MG tablet Take 1 tablet (10 mg total) by mouth daily.  Marland Kitchen TAZTIA XT 120 MG 24 hr capsule take 1 capsule by mouth once daily  .  [DISCONTINUED] pravastatin (PRAVACHOL) 40 MG tablet take 1 tablet by mouth once daily  . [DISCONTINUED] sertraline (ZOLOFT) 50 MG tablet take 1 tablet by mouth once daily   No facility-administered encounter medications on file as of 02/02/2016.     Review of Systems  Constitutional: Negative for appetite change and unexpected weight change.  HENT: Negative for congestion and sinus pressure.   Respiratory: Negative for cough, chest tightness and shortness of breath.   Cardiovascular: Negative for chest pain, palpitations and leg swelling.  Gastrointestinal: Negative for abdominal pain, diarrhea, nausea and vomiting.  Genitourinary: Negative for difficulty urinating and dysuria.  Musculoskeletal: Negative for joint swelling and myalgias.  Skin: Negative for color change and rash.  Neurological: Negative for dizziness, light-headedness and headaches.  Psychiatric/Behavioral: Negative for agitation and dysphoric mood.       Increased stress as outlined.         Objective:     Blood pressure rechecked by me:  138-140/78  Physical Exam  Constitutional: She appears well-developed and well-nourished. No distress.  HENT:  Nose: Nose normal.  Mouth/Throat: Oropharynx is clear and moist.  Neck: Neck supple. No thyromegaly present.  Cardiovascular: Normal rate and regular rhythm.   Pulmonary/Chest: Breath sounds normal. No respiratory distress. She has no wheezes.  Abdominal: Soft. Bowel sounds are normal. There is no tenderness.  Musculoskeletal: She exhibits no edema or tenderness.  Lymphadenopathy:    She has no cervical adenopathy.  Skin: No rash noted. No erythema.  Psychiatric: She has a normal mood and affect. Her behavior is normal.    BP (!) 148/64   Pulse (!) 56   Temp 98.2 F (36.8 C) (Oral)   Resp 10   Ht 5\' 2"  (1.575 m)   Wt 125 lb (56.7 kg)   SpO2 98%   BMI 22.86 kg/m  Wt Readings from Last 3 Encounters:  02/02/16 125 lb (56.7 kg)  10/29/15 123 lb (55.8 kg)    05/25/15 124 lb 8 oz (56.5 kg)     Lab Results  Component Value Date   WBC 4.8 06/08/2015   HGB 12.0 06/08/2015   HCT 36.0 06/08/2015   PLT 210.0 06/08/2015   GLUCOSE 63 (L) 02/02/2016   CHOL 173 02/02/2016   TRIG 129.0 02/02/2016   HDL 66.90 02/02/2016   LDLCALC 80 02/02/2016   ALT 8 02/02/2016   AST 20 02/02/2016   NA 143 02/02/2016   K 4.4 02/02/2016   CL 106 02/02/2016   CREATININE 1.01 02/02/2016   BUN 20 02/02/2016   CO2 30 02/02/2016   TSH 1.75 06/01/2015    Mm Screening Breast Tomo Bilateral  Result Date: 05/19/2014 CLINICAL DATA:  Screening. EXAM: DIGITAL SCREENING BILATERAL MAMMOGRAM WITH 3D TOMO WITH CAD COMPARISON:  Previous exam(s). ACR Breast Density Category b: There are scattered areas of fibroglandular density. FINDINGS: There are no findings suspicious for malignancy. Images were processed with CAD. IMPRESSION: No mammographic evidence of malignancy. A result letter of this screening mammogram will be mailed directly to the patient. RECOMMENDATION: Screening mammogram in one year. (Code:SM-B-01Y) BI-RADS CATEGORY  1: Negative. Electronically Signed   By: Altamese Cabal M.D.   On: 05/19/2014 11:23       Assessment & Plan:   Problem List Items Addressed This Visit    Anemia    Last hgb checked wnl.  Follow.        CVA (cerebral vascular accident) ([place])    Has done well on plavix.  Stable.        Hypercholesterolemia    On pravastatin.  Low cholesterol diet and exercise.  Follow lipid panel and liver function tests.        Hypertension    Blood pressure under good control.  Continue same medication regimen.  Follow pressures.  Follow metabolic panel.        Renal insufficiency    Creatinine stable - 1.01.  Follow.       Stress    Increased stress as outlined.  Discussed with her today.  On zoloft.  Continue.  Does not feel she needs anything more at this time.  Follow.            Einar Pheasant, MD

## 2016-02-04 ENCOUNTER — Other Ambulatory Visit: Payer: Self-pay | Admitting: Internal Medicine

## 2016-02-07 ENCOUNTER — Encounter: Payer: Self-pay | Admitting: Internal Medicine

## 2016-02-07 NOTE — Assessment & Plan Note (Signed)
Creatinine stable - 1.01.  Follow.

## 2016-02-07 NOTE — Assessment & Plan Note (Signed)
Increased stress as outlined.  Discussed with her today.  On zoloft.  Continue.  Does not feel she needs anything more at this time.  Follow.

## 2016-02-07 NOTE — Assessment & Plan Note (Signed)
On pravastatin.  Low cholesterol diet and exercise.  Follow lipid panel and liver function tests.   

## 2016-02-07 NOTE — Assessment & Plan Note (Signed)
Last hgb checked wnl.  Follow.

## 2016-02-07 NOTE — Assessment & Plan Note (Signed)
Has done well on plavix.  Stable.

## 2016-02-07 NOTE — Assessment & Plan Note (Signed)
Blood pressure under good control.  Continue same medication regimen.  Follow pressures.  Follow metabolic panel.   

## 2016-04-14 ENCOUNTER — Other Ambulatory Visit: Payer: Self-pay | Admitting: Internal Medicine

## 2016-05-09 ENCOUNTER — Other Ambulatory Visit: Payer: Self-pay | Admitting: Internal Medicine

## 2016-05-24 ENCOUNTER — Ambulatory Visit (INDEPENDENT_AMBULATORY_CARE_PROVIDER_SITE_OTHER): Payer: Medicare Other

## 2016-05-24 VITALS — BP 118/62 | HR 62 | Temp 97.9°F | Resp 12 | Ht 62.0 in | Wt 126.4 lb

## 2016-05-24 DIAGNOSIS — Z0000 Encounter for general adult medical examination without abnormal findings: Secondary | ICD-10-CM

## 2016-05-24 NOTE — Patient Instructions (Addendum)
  Rachel Martinez , Thank you for taking time to come for your Medicare Wellness Visit. I appreciate your ongoing commitment to your health goals. Please review the following plan we discussed and let me know if I can assist you in the future.   Follow up with Dr. Nicki Reaper as needed.    Bring a copy of your [place] and/or Living Will to be scanned into chart once completed.  Have a great day!  These are the goals we discussed: Goals    . Healthy Lifestyle          Eat a healthy diet Stay active and continue exercise regimen Stay hydrated and drink plenty of water       This is a list of the screening recommended for you and due dates:  Health Maintenance  Topic Date Due  . Tetanus Vaccine  03/10/1948  . Pneumonia vaccines (1 of 2 - PCV13) 03/10/1994  . Mammogram  06/23/2017*  . DEXA scan (bone density measurement)  06/23/2017*  . Flu Shot  08/24/2016  *Topic was postponed. The date shown is not the original due date.

## 2016-05-24 NOTE — Progress Notes (Signed)
Subjective:   Rachel Martinez is a 83 y.o. female who presents for Medicare Annual/Subsequent preventive examination.  Review of Systems:  Cardiac Risk Factors include: advanced age (>12men, >75 women);hypertension No ROS.  Medicare Wellness Visit.     Objective:    Vitals: BP 118/62 (BP Location: Left Arm, Patient Position: Sitting, Cuff Size: Normal)   Pulse 62   Temp 97.9 F (36.6 C) (Oral)   Resp 12   Ht 5\' 2"  (1.575 m)   Wt 126 lb 6.4 oz (57.3 kg)   SpO2 98%   BMI 23.12 kg/m   Body mass index is 23.12 kg/m.  Tobacco History  Smoking Status  . Former Smoker  Smokeless Tobacco  . Never Used     Counseling given: Not Answered   Past Medical History:  Diagnosis Date  . Chicken pox   . CVA (cerebral vascular accident) ([place]) 8/06  . Heart palpitations   . Hypercholesteremia   . Hypertension   . Stroke [facility])    Past Surgical History:  Procedure Laterality Date  . APPENDECTOMY     Family History  Problem Relation Age of Onset  . CVA Mother   . Diabetes Father   . Heart disease Father   . Leukemia Brother   . Lung disease Brother   . Hypertension Sister   . Mental illness Brother     suicide  . Breast cancer Paternal Aunt    History  Sexual Activity  . Sexual activity: Not on file    Outpatient Encounter Prescriptions as of 05/24/2016  Medication Sig  . calcium carbonate (TUMS - DOSED IN MG ELEMENTAL CALCIUM) 500 MG chewable tablet Chew 1 tablet by mouth 3 (three) times daily as needed.   . clopidogrel (PLAVIX) 75 MG tablet take 1 tablet by mouth once daily  . Cyanocobalamin (B-12 PO) Take by mouth.  Marland Kitchen lisinopril (PRINIVIL,ZESTRIL) 10 MG tablet Take 1 tablet (10 mg total) by mouth daily.  . pravastatin (PRAVACHOL) 40 MG tablet take 1 tablet by mouth once daily  . sertraline (ZOLOFT) 50 MG tablet take 1 tablet by mouth once daily  . TAZTIA XT 120 MG 24 hr capsule take 1 capsule by mouth once daily   No facility-administered encounter medications on  file as of 05/24/2016.     Activities of Daily Living In your present state of health, do you have any difficulty performing the following activities: 05/24/2016  Hearing? N  Vision? N  Difficulty concentrating or making decisions? N  Walking or climbing stairs? N  Dressing or bathing? N  Doing errands, shopping? Y  Preparing Food and eating ? N  Using the Toilet? N  In the past six months, have you accidently leaked urine? N  Do you have problems with loss of bowel control? N  Managing your Medications? N  Managing your Finances? N  Housekeeping or managing your Housekeeping? N  Some recent data might be hidden    Patient Care Team: Einar Pheasant, MD as PCP - General (Internal Medicine)   Assessment:    This is a routine wellness examination for Rachel Martinez. The goal of the wellness visit is to assist the patient how to close the gaps in care and create a preventative care plan for the patient.   Osteoporosis risk reviewed.  Medications reviewed; taking without issues or barriers.  Safety issues reviewed; smoke detectors in the home. No firearms in the home.  Wears seatbelts when riding with others. Patient does wear sunscreen or protective  clothing when in direct sunlight. No violence in the home.  Depression- PHQ 2 &9 complete.  No signs/symptoms or verbal communication regarding little pleasure in doing things, feeling down, depressed or hopeless. No changes in sleeping, energy, eating, concentrating.  No thoughts of self harm or harm towards others.  Time spent on this topic is 10 minutes.   Patient is alert, normal appearance, oriented to person/place/and time. Correctly identified the president of the [place], recall of 2/3 words, and performing simple calculations.  Patient displays appropriate judgement and can read correct time from watch face.  No new identified risk were noted.  No failures at ADL's or IADL's.   BMI- discussed the importance of a healthy diet, water  intake and exercise. Educational material provided.   HTN- followed by PCP.  Dental- every six months.  Dr. Toy Cookey.  Eye- Visual acuity not assessed per patient preference since they have regular follow up with the ophthalmologist.  Wears corrective lenses.  Sleep patterns- Sleeps 6-8 hours at night.  Wakes feeling rested.  Prevnar 13 and  TDAP vaccine deferred per patient preference.    Educational material provided.  Patient Concerns: None at this time. Follow up with PCP as needed.  Exercise Activities and Dietary recommendations Current Exercise Habits: Home exercise routine, Type of exercise: calisthenics, Time (Minutes): 25, Frequency (Times/Week): 7, Weekly Exercise (Minutes/Week): 175, Intensity: Mild  Goals    . Healthy Lifestyle          Eat a healthy diet Stay active and continue exercise regimen Stay hydrated and drink plenty of water      Fall Risk Fall Risk  05/24/2016 10/29/2015 05/25/2015 02/21/2014 09/17/2013  Falls in the past year? Yes No Yes No No  Number falls in past yr: 1 - 2 or more - -  Injury with Fall? No - No - -  Risk for fall due to : (No Data) - - - -  Risk for fall due to (comments): Leg weakness - - - -  Follow up Falls prevention discussed;Education provided - - - -   Depression Screen PHQ 2/9 Scores 05/24/2016 10/29/2015 05/25/2015 02/21/2014  PHQ - 2 Score 0 0 0 0  PHQ- 9 Score 0 - - -    Cognitive Function     6CIT Screen 05/24/2016  What Year? 0 points  What month? 0 points  What time? 0 points  Count back from 20 0 points  Months in reverse 0 points  Repeat phrase 0 points  Total Score 0    Immunization History  Administered Date(s) Administered  . Influenza Split 11/23/2011, 11/12/2012  . Influenza, High Dose Seasonal PF 10/29/2015  . Influenza,inj,Quad PF,36+ Mos 11/20/2013   Screening Tests Health Maintenance  Topic Date Due  . TETANUS/TDAP  03/10/1948  . PNA vac Low Risk Adult (1 of 2 - PCV13) 03/10/1994  . MAMMOGRAM   06/23/2017 (Originally 05/19/2015)  . DEXA SCAN  06/23/2017 (Originally 03/10/1994)  . INFLUENZA VACCINE  08/24/2016      Plan:    End of life planning; Advanced aging; Advanced directives discussed.  No HCPOA/Living Will.  Additional information provided to help them start the conversation with family.  Copy of HCPOA/Living Will requested upon completion. Time spent on this topic is 17 minutes.  I have personally reviewed and noted the following in the patient's chart:   . Medical and social history . Use of alcohol, tobacco or illicit drugs  . Current medications and supplements . Functional ability and status .  Nutritional status . Physical activity . Advanced directives . List of other physicians . Hospitalizations, surgeries, and ER visits in previous 12 months . Vitals . Screenings to include cognitive, depression, and falls . Referrals and appointments  In addition, I have reviewed and discussed with patient certain preventive protocols, quality metrics, and best practice recommendations. A written personalized care plan for preventive services as well as general preventive health recommendations were provided to patient.     Varney Biles, LPN  07/24/2525   Reviewed above information.  Agree with plan.  Dr Nicki Reaper

## 2016-06-02 ENCOUNTER — Encounter: Payer: Medicare Other | Admitting: Internal Medicine

## 2016-08-24 ENCOUNTER — Other Ambulatory Visit: Payer: Self-pay | Admitting: Internal Medicine

## 2016-08-30 ENCOUNTER — Ambulatory Visit (INDEPENDENT_AMBULATORY_CARE_PROVIDER_SITE_OTHER): Payer: Medicare Other | Admitting: Internal Medicine

## 2016-08-30 ENCOUNTER — Encounter: Payer: Self-pay | Admitting: Internal Medicine

## 2016-08-30 VITALS — BP 110/60 | HR 59 | Temp 98.7°F | Resp 12 | Ht 62.0 in | Wt 125.6 lb

## 2016-08-30 DIAGNOSIS — I639 Cerebral infarction, unspecified: Secondary | ICD-10-CM

## 2016-08-30 DIAGNOSIS — E538 Deficiency of other specified B group vitamins: Secondary | ICD-10-CM | POA: Diagnosis not present

## 2016-08-30 DIAGNOSIS — Z8673 Personal history of transient ischemic attack (TIA), and cerebral infarction without residual deficits: Secondary | ICD-10-CM

## 2016-08-30 DIAGNOSIS — D649 Anemia, unspecified: Secondary | ICD-10-CM

## 2016-08-30 DIAGNOSIS — F439 Reaction to severe stress, unspecified: Secondary | ICD-10-CM | POA: Diagnosis not present

## 2016-08-30 DIAGNOSIS — Z0000 Encounter for general adult medical examination without abnormal findings: Secondary | ICD-10-CM

## 2016-08-30 DIAGNOSIS — E7800 Pure hypercholesterolemia, unspecified: Secondary | ICD-10-CM | POA: Diagnosis not present

## 2016-08-30 DIAGNOSIS — I10 Essential (primary) hypertension: Secondary | ICD-10-CM

## 2016-08-30 DIAGNOSIS — K259 Gastric ulcer, unspecified as acute or chronic, without hemorrhage or perforation: Secondary | ICD-10-CM

## 2016-08-30 NOTE — Progress Notes (Signed)
Patient ID: Rachel Martinez, female   DOB: 04/27/1927, 83 y.o.   MRN: [id number]   Subjective:    Patient ID: Rachel Martinez, female    DOB: 1927/11/06, 83 y.o.   MRN: [id number]  HPI  Patient with past history of previous CVA, hypertension and hypercholesterolemia.  She comes in today to follow up on these issues as well as for a complete physical exam.  Increased stress with caring for her husband.  She is primary caretaker.  Unable to sleep through the night secondary to him calling her.  Some fatigue.  She relates to the increased stress, etc.  No chest pain.  No sob. No acid reflux.  No abdominal pain.  Bowels moving.  Discussed getting help in the home.  She feels she is handling things relatively well.  Desires no further intervention.     Past Medical History:  Diagnosis Date  . Chicken pox   . CVA (cerebral vascular accident) ([place]) 8/06  . Heart palpitations   . Hypercholesteremia   . Hypertension   . Stroke [facility])    Past Surgical History:  Procedure Laterality Date  . APPENDECTOMY     Family History  Problem Relation Age of Onset  . CVA Mother   . Diabetes Father   . Heart disease Father   . Leukemia Brother   . Lung disease Brother   . Hypertension Sister   . Mental illness Brother        suicide  . Breast cancer Paternal Aunt    Social History   Social History  . Marital status: Married    Spouse name: N/A  . Number of children: N/A  . Years of education: N/A   Social History Main Topics  . Smoking status: Former Research scientist (life sciences)  . Smokeless tobacco: Never Used  . Alcohol use No  . Drug use: No  . Sexual activity: Not Asked   Other Topics Concern  . None   Social History Narrative  . None    Outpatient Encounter Prescriptions as of 08/30/2016  Medication Sig  . calcium carbonate (TUMS - DOSED IN MG ELEMENTAL CALCIUM) 500 MG chewable tablet Chew 1 tablet by mouth 3 (three) times daily as needed.   . clopidogrel (PLAVIX) 75 MG tablet take 1 tablet by mouth  once daily  . Cyanocobalamin (B-12 PO) Take by mouth.  Marland Kitchen lisinopril (PRINIVIL,ZESTRIL) 10 MG tablet Take 1 tablet (10 mg total) by mouth daily.  . pravastatin (PRAVACHOL) 40 MG tablet take 1 tablet by mouth once daily  . sertraline (ZOLOFT) 50 MG tablet take 1 tablet by mouth once daily  . TAZTIA XT 120 MG 24 hr capsule take 1 capsule by mouth once daily   No facility-administered encounter medications on file as of 08/30/2016.     Review of Systems  Constitutional: Positive for fatigue. Negative for appetite change and unexpected weight change.  HENT: Negative for congestion and sinus pressure.   Eyes: Negative for pain and visual disturbance.  Respiratory: Negative for cough, chest tightness and shortness of breath.   Cardiovascular: Negative for chest pain, palpitations and leg swelling.  Gastrointestinal: Negative for abdominal pain, diarrhea, nausea and vomiting.  Genitourinary: Negative for difficulty urinating and dysuria.  Musculoskeletal: Negative for back pain and joint swelling.  Skin: Negative for color change and rash.  Neurological: Negative for dizziness, light-headedness and headaches.  Hematological: Negative for adenopathy. Does not bruise/bleed easily.  Psychiatric/Behavioral: Negative for agitation and dysphoric mood.  Increased stress as outlined.         Objective:    Physical Exam  Constitutional: She is oriented to person, place, and time. She appears well-developed and well-nourished. No distress.  HENT:  Nose: Nose normal.  Mouth/Throat: Oropharynx is clear and moist.  Eyes: Right eye exhibits no discharge. Left eye exhibits no discharge. No scleral icterus.  Neck: Neck supple. No thyromegaly present.  Cardiovascular: Normal rate and regular rhythm.   Pulmonary/Chest: Breath sounds normal. No accessory muscle usage. No tachypnea. No respiratory distress. She has no decreased breath sounds. She has no wheezes. She has no rhonchi. Right breast exhibits  no inverted nipple, no mass, no nipple discharge and no tenderness (no axillary adenopathy). Left breast exhibits no inverted nipple, no mass, no nipple discharge and no tenderness (no axilarry adenopathy).  Abdominal: Soft. Bowel sounds are normal. There is no tenderness.  Musculoskeletal: She exhibits no edema or tenderness.  Lymphadenopathy:    She has no cervical adenopathy.  Neurological: She is alert and oriented to person, place, and time.  Skin: Skin is warm. No rash noted. No erythema.  Psychiatric: She has a normal mood and affect. Her behavior is normal.    BP 110/60 (BP Location: Left Arm, Patient Position: Sitting, Cuff Size: Normal)   Pulse (!) 59   Temp 98.7 F (37.1 C) (Oral)   Resp 12   Ht 5\' 2"  (1.575 m)   Wt 125 lb 9.6 oz (57 kg)   SpO2 99%   BMI 22.97 kg/m  Wt Readings from Last 3 Encounters:  08/30/16 125 lb 9.6 oz (57 kg)  05/24/16 126 lb 6.4 oz (57.3 kg)  02/02/16 125 lb (56.7 kg)     Lab Results  Component Value Date   WBC 6.3 08/30/2016   HGB 8.1 Repeated and verified X2. (L) 08/30/2016   HCT 25.9 Repeated and verified X2. (L) 08/30/2016   PLT 231.0 08/30/2016   GLUCOSE 89 08/30/2016   CHOL 164 08/30/2016   TRIG 93.0 08/30/2016   HDL 67.10 08/30/2016   LDLCALC 79 08/30/2016   ALT 9 08/30/2016   AST 21 08/30/2016   NA 142 08/30/2016   K 4.5 08/30/2016   CL 107 08/30/2016   CREATININE 1.09 08/30/2016   BUN 28 (H) 08/30/2016   CO2 29 08/30/2016   TSH 1.65 08/30/2016    Mm Screening Breast Tomo Bilateral  Result Date: 05/19/2014 CLINICAL DATA:  Screening. EXAM: DIGITAL SCREENING BILATERAL MAMMOGRAM WITH 3D TOMO WITH CAD COMPARISON:  Previous exam(s). ACR Breast Density Category b: There are scattered areas of fibroglandular density. FINDINGS: There are no findings suspicious for malignancy. Images were processed with CAD. IMPRESSION: No mammographic evidence of malignancy. A result letter of this screening mammogram will be mailed directly to  the patient. RECOMMENDATION: Screening mammogram in one year. (Code:SM-B-01Y) BI-RADS CATEGORY  1: Negative. Electronically Signed   By: Altamese Cabal M.D.   On: 05/19/2014 11:23       Assessment & Plan:   Problem List Items Addressed This Visit    Anemia    Recheck cbc today.        Relevant Orders   Ferritin   IBC panel   Gastric ulcer    Has a h/o gastric ulcer.  No upper symptoms.  Recheck cbc.       Health care maintenance    Physical today 08/30/16.  Declines mammogram.        History of CVA (cerebrovascular accident)    Has  done well on plavix.  Follow.       Hypercholesterolemia    On pravastatin.  Follow lipid panel and liver function tests.        Relevant Orders   Hepatic function panel (Completed)   Lipid panel (Completed)   Hypertension - Primary    Blood pressure under good control.  Continue same medication regimen.  Follow pressures.  Follow metabolic panel.        Relevant Orders   CBC with Differential/Platelet (Completed)   TSH (Completed)   Basic metabolic panel (Completed)   Stress    Increased stress as outlined.  Discussed with her today.  She declines any further intervention at this time.  Follow.         Other Visit Diagnoses    B12 deficiency       Relevant Orders   Vitamin B12 (Completed)       Einar Pheasant, MD

## 2016-08-30 NOTE — Progress Notes (Signed)
Pre-visit discussion using our clinic review tool. No additional management support is needed unless otherwise documented below in the visit note.  

## 2016-08-31 LAB — CBC WITH DIFFERENTIAL/PLATELET
BASOS ABS: 0.1 10*3/uL (ref 0.0–0.1)
Basophils Relative: 1.2 % (ref 0.0–3.0)
EOS ABS: 0.1 10*3/uL (ref 0.0–0.7)
Eosinophils Relative: 1.5 % (ref 0.0–5.0)
Hemoglobin: 8.1 g/dL — ABNORMAL LOW (ref 12.0–15.0)
LYMPHS PCT: 27.1 % (ref 12.0–46.0)
Lymphs Abs: 1.7 10*3/uL (ref 0.7–4.0)
MCHC: 31.4 g/dL (ref 30.0–36.0)
MCV: 83.8 fl (ref 78.0–100.0)
MONO ABS: 0.5 10*3/uL (ref 0.1–1.0)
Monocytes Relative: 7.6 % (ref 3.0–12.0)
NEUTROS ABS: 3.9 10*3/uL (ref 1.4–7.7)
Neutrophils Relative %: 62.6 % (ref 43.0–77.0)
PLATELETS: 231 10*3/uL (ref 150.0–400.0)
RBC: 3.09 Mil/uL — ABNORMAL LOW (ref 3.87–5.11)
RDW: 17.4 % — ABNORMAL HIGH (ref 11.5–15.5)
WBC: 6.3 10*3/uL (ref 4.0–10.5)

## 2016-08-31 LAB — HEPATIC FUNCTION PANEL
ALK PHOS: 70 U/L (ref 39–117)
ALT: 9 U/L (ref 0–35)
AST: 21 U/L (ref 0–37)
Albumin: 4.1 g/dL (ref 3.5–5.2)
BILIRUBIN DIRECT: 0.1 mg/dL (ref 0.0–0.3)
BILIRUBIN TOTAL: 0.3 mg/dL (ref 0.2–1.2)
Total Protein: 6.3 g/dL (ref 6.0–8.3)

## 2016-08-31 LAB — LIPID PANEL
CHOL/HDL RATIO: 2
Cholesterol: 164 mg/dL (ref 0–200)
HDL: 67.1 mg/dL (ref >39.00)
LDL CALC: 79 mg/dL (ref 0–99)
NonHDL: 97.27
Triglycerides: 93 mg/dL (ref 0.0–149.0)
VLDL: 18.6 mg/dL (ref 0.0–40.0)

## 2016-08-31 LAB — TSH: TSH: 1.65 u[IU]/mL (ref 0.35–4.50)

## 2016-08-31 LAB — BASIC METABOLIC PANEL
BUN: 28 mg/dL — ABNORMAL HIGH (ref 6–23)
CO2: 29 meq/L (ref 19–32)
CREATININE: 1.09 mg/dL (ref 0.40–1.20)
Calcium: 8.7 mg/dL (ref 8.4–10.5)
Chloride: 107 mEq/L (ref 96–112)
GFR: 50.41 mL/min — ABNORMAL LOW (ref >60.00)
Glucose, Bld: 89 mg/dL (ref 70–99)
Potassium: 4.5 mEq/L (ref 3.5–5.1)
Sodium: 142 mEq/L (ref 135–145)

## 2016-08-31 LAB — VITAMIN B12: VITAMIN B 12: 410 pg/mL (ref 211–911)

## 2016-09-01 ENCOUNTER — Encounter: Payer: Self-pay | Admitting: Internal Medicine

## 2016-09-01 ENCOUNTER — Other Ambulatory Visit (INDEPENDENT_AMBULATORY_CARE_PROVIDER_SITE_OTHER): Payer: Medicare Other

## 2016-09-01 DIAGNOSIS — D649 Anemia, unspecified: Secondary | ICD-10-CM

## 2016-09-01 LAB — IBC PANEL
IRON: 40 ug/dL — AB (ref 42–145)
Saturation Ratios: 8.2 % — ABNORMAL LOW (ref 20.0–50.0)
TRANSFERRIN: 350 mg/dL (ref 212.0–360.0)

## 2016-09-01 LAB — FERRITIN: FERRITIN: 7.4 ng/mL — AB (ref 10.0–291.0)

## 2016-09-01 NOTE — Assessment & Plan Note (Signed)
Physical today 08/30/16.  Declines mammogram.

## 2016-09-01 NOTE — Assessment & Plan Note (Signed)
Has done well on plavix.  Follow.   

## 2016-09-01 NOTE — Assessment & Plan Note (Signed)
Blood pressure under good control.  Continue same medication regimen.  Follow pressures.  Follow metabolic panel.   

## 2016-09-01 NOTE — Assessment & Plan Note (Signed)
Recheck cbc today.   

## 2016-09-01 NOTE — Assessment & Plan Note (Signed)
Has a h/o gastric ulcer.  No upper symptoms.  Recheck cbc.

## 2016-09-01 NOTE — Assessment & Plan Note (Signed)
On pravastatin.  Follow lipid panel and liver function tests.   

## 2016-09-01 NOTE — Assessment & Plan Note (Signed)
Increased stress as outlined.  Discussed with her today.  She declines any further intervention at this time.  Follow.

## 2016-09-06 ENCOUNTER — Other Ambulatory Visit: Payer: Self-pay | Admitting: Gastroenterology

## 2016-09-06 DIAGNOSIS — D509 Iron deficiency anemia, unspecified: Secondary | ICD-10-CM

## 2016-09-06 DIAGNOSIS — K2960 Other gastritis without bleeding: Secondary | ICD-10-CM | POA: Diagnosis not present

## 2016-09-12 ENCOUNTER — Inpatient Hospital Stay: Payer: Medicare Other | Attending: Oncology | Admitting: Oncology

## 2016-09-12 ENCOUNTER — Encounter: Payer: Self-pay | Admitting: Oncology

## 2016-09-12 VITALS — BP 145/67 | HR 58 | Temp 97.4°F | Resp 18 | Ht 62.99 in | Wt 118.5 lb

## 2016-09-12 DIAGNOSIS — Z8719 Personal history of other diseases of the digestive system: Secondary | ICD-10-CM

## 2016-09-12 DIAGNOSIS — D508 Other iron deficiency anemias: Secondary | ICD-10-CM | POA: Insufficient documentation

## 2016-09-12 DIAGNOSIS — K2950 Unspecified chronic gastritis without bleeding: Secondary | ICD-10-CM | POA: Insufficient documentation

## 2016-09-12 DIAGNOSIS — K259 Gastric ulcer, unspecified as acute or chronic, without hemorrhage or perforation: Secondary | ICD-10-CM | POA: Diagnosis not present

## 2016-09-12 DIAGNOSIS — Z79899 Other long term (current) drug therapy: Secondary | ICD-10-CM | POA: Diagnosis not present

## 2016-09-12 DIAGNOSIS — Z87891 Personal history of nicotine dependence: Secondary | ICD-10-CM | POA: Insufficient documentation

## 2016-09-12 DIAGNOSIS — I10 Essential (primary) hypertension: Secondary | ICD-10-CM | POA: Insufficient documentation

## 2016-09-12 DIAGNOSIS — Z8673 Personal history of transient ischemic attack (TIA), and cerebral infarction without residual deficits: Secondary | ICD-10-CM | POA: Insufficient documentation

## 2016-09-12 DIAGNOSIS — E7800 Pure hypercholesterolemia, unspecified: Secondary | ICD-10-CM | POA: Insufficient documentation

## 2016-09-12 DIAGNOSIS — D649 Anemia, unspecified: Secondary | ICD-10-CM

## 2016-09-12 NOTE — Progress Notes (Addendum)
Hematology/Oncology Consult note [facility] [phone] [phone]  CONSULT NOTE Patient Care Team: Einar Pheasant, MD as PCP - General (Internal Medicine)  CHIEF COMPLAINTS/PURPOSE OF CONSULTATION:     HISTORY OF PRESENTING ILLNESS:  Rachel Martinez 83 y.o.  female with past medical history listed as below who was referred to me by Dr.London for evaluation of anemia.  Patient has a history of gastritis. She had EGD done in 2015 April which showed normal esophagus, duodenum. Gastric ulcer was clean base, erosive gastritis biopsied. Biopsy showed reactive gastropathy and focal intestinal metaplasia. No active inflammation atrophy is plus CL or malignancy. H pylori was not identified. Patient follows up with primary care provider Dr. Nicki Reaper and had recent labs were done. She was found to be anemic with a hemoglobin of 8.1, which is down from 12 about a year ago. Differential was normal. WBC and platelet counts were normal. Iron panels were checked. Ferritin 7.4, iron 40, saturation ratios 8.2 chest ferritin 350.   Patient reports feeling profoundly fatigued. She is also quite stressed during this time as her husband just had a stroke. 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 She takes Plavix for stroke prevention. She denies any black stool or blood in the stool, stomach discomfort, abdominal pain, chest pain, shortness of breath. She started taking oral iron supplementation recently.   ROS:  Review of Systems  Constitutional: Positive for fatigue.  HENT:  Negative.   Eyes: Negative.   Respiratory: Negative.   Cardiovascular: Negative.   Gastrointestinal: Negative.   Endocrine: Negative.   Genitourinary: Negative.    Musculoskeletal: Negative.   Skin: Negative.   Neurological: Negative.   Hematological: Negative.   Psychiatric/Behavioral: Negative.     MEDICAL HISTORY:  Past Medical History:  Diagnosis  Date  . Chicken pox   . CVA (cerebral vascular accident) ([place]) 8/06  . Heart palpitations   . Hypercholesteremia   . Hypertension   . Stroke [facility])     SURGICAL HISTORY: Past Surgical History:  Procedure Laterality Date  . APPENDECTOMY      SOCIAL HISTORY: Social History   Social History  . Marital status: Married    Spouse name: N/A  . Number of children: N/A  . Years of education: N/A   Occupational History  . Not on file.   Social History Main Topics  . Smoking status: Former Research scientist (life sciences)  . Smokeless tobacco: Never Used  . Alcohol use No  . Drug use: No  . Sexual activity: Not on file   Other Topics Concern  . Not on file   Social History Narrative  . No narrative on file    FAMILY HISTORY: Family History  Problem Relation Age of Onset  . CVA Mother   . Diabetes Father   . Heart disease Father   . Leukemia Brother   . Lung disease Brother   . Hypertension Sister   . Mental illness Brother        suicide  . Breast cancer Paternal Aunt     ALLERGIES:  is allergic to amoxicillin-pot clavulanate; ciprofloxacin; codeine sulfate; imipenem; and metronidazole.  MEDICATIONS:  Current Outpatient Prescriptions  Medication Sig Dispense Refill  . calcium carbonate (TUMS - DOSED IN MG ELEMENTAL CALCIUM) 500 MG chewable tablet Chew 1 tablet by mouth 3 (three) times daily as needed.     . clopidogrel (PLAVIX) 75 MG tablet take 1 tablet by mouth once daily 30 tablet 5  .  Cyanocobalamin (B-12 PO) Take by mouth.    Marland Kitchen lisinopril (PRINIVIL,ZESTRIL) 10 MG tablet Take 1 tablet (10 mg total) by mouth daily. 30 tablet 5  . pravastatin (PRAVACHOL) 40 MG tablet take 1 tablet by mouth once daily 30 tablet 5  . sertraline (ZOLOFT) 50 MG tablet take 1 tablet by mouth once daily 30 tablet 0  . TAZTIA XT 120 MG 24 hr capsule take 1 capsule by mouth once daily 30 capsule 5   No current facility-administered medications for this visit.       Marland Kitchen  PHYSICAL EXAMINATION: ECOG  PERFORMANCE STATUS: 1 - Symptomatic but completely ambulatory Vitals:   09/12/16 1530  BP: (!) 145/67  Pulse: (!) 58  Resp: 18  Temp: (!) 97.4 F (36.3 C)   Filed Weights   09/12/16 1530  Weight: 118 lb 8 oz (53.8 kg)    GENE, Pale, no distress and comfortable.  EYESNot icteric, conjunctivae pale PHARYNX: no thrush or ulceration; good dentition  NECK: supple, no masses felt LYMPH:  no palpable lymphadenopathy in the cervical, axillary or inguinal regions LUNGS: clear to auscultation and  No wheeze or crackles HEART/CVS: regular rate & rhythm and no murmurs; No lower extremity edema ABDOMEN: abdomen soft, non-tender and normal bowel sounds Musculoskeletal:no cyanosis of digits and no clubbing  PSYCH: alert & oriented x 3  NEURO: no focal motor/sensory deficits SKIN:  no rashes or significant lesions  LABORATORY DATA:  I have reviewed the data as listed Lab Results  Component Value Date   WBC 6.3 08/30/2016   HGB 8.1 Repeated and verified X2. (L) 08/30/2016   HCT 25.9 Repeated and verified X2. (L) 08/30/2016   MCV 83.8 08/30/2016   PLT 231.0 08/30/2016    Recent Labs  02/02/16 1345 08/30/16 1642  NA 143 142  K 4.4 4.5  CL 106 107  CO2 30 29  GLUCOSE 63* 89  BUN 20 28*  CREATININE 1.01 1.09  CALCIUM 9.1 8.7  PROT 6.3 6.3  ALBUMIN 4.0 4.1  AST 20 21  ALT 8 9  ALKPHOS 70 70  BILITOT 0.3 0.3  BILIDIR 0.1 0.1    RADIOGRAPHIC STUDIES: I have personally reviewed the radiological images as listed and agreed with the findings in the report. No results found.  ASSESSMENT & PLAN:  1. Anemia, unspecified type   2. Other iron deficiency anemia   3. Fatigue associated with anemia   4. History of gastritis    For patient's svere iron deficiency anemia, patient appears to be quite symptomatic. She has started taken oral iron supplementation however oral supplementation takes weeks to replete iron store, in addition she has chronic gastritis, on and off PPI,  absorption of iron is questionable.. I recommend IV iron with Venofer for weekly for 5 doses.   # source of blood loss, possible chronic GI blood loss. . She follows up with Dr. Jacqulyn Liner GI. She is scheduled for EGD tomorrow. Check occult stool. All questions were answered. The patient knows to call the clinic with any problems questions or concerns.  Return of visit: 2 weeks with repeat CBC. Thank you for this kind referral and the opportunity to participate in the care of this patient. A copy of today's note is routed to referring provider Dr.London   Earlie Server, MD, PhD Hematology Oncology [facility] Pager- [phone] 09/12/2016

## 2016-09-12 NOTE — Progress Notes (Signed)
Here for new pt evaluation.  

## 2016-09-13 ENCOUNTER — Ambulatory Visit
Admission: RE | Admit: 2016-09-13 | Discharge: 2016-09-13 | Disposition: A | Discharge status: Home / Self Care | Payer: Medicare Other | Source: Ambulatory Visit | Attending: Gastroenterology | Admitting: Gastroenterology

## 2016-09-13 DIAGNOSIS — D509 Iron deficiency anemia, unspecified: Secondary | ICD-10-CM

## 2016-09-13 DIAGNOSIS — R449 Unspecified symptoms and signs involving general sensations and perceptions: Secondary | ICD-10-CM | POA: Diagnosis not present

## 2016-09-13 DIAGNOSIS — K219 Gastro-esophageal reflux disease without esophagitis: Secondary | ICD-10-CM | POA: Insufficient documentation

## 2016-09-13 DIAGNOSIS — K449 Diaphragmatic hernia without obstruction or gangrene: Secondary | ICD-10-CM | POA: Insufficient documentation

## 2016-09-21 ENCOUNTER — Inpatient Hospital Stay: Payer: Medicare Other

## 2016-09-21 VITALS — BP 130/76 | HR 52 | Temp 97.5°F | Resp 18

## 2016-09-21 DIAGNOSIS — D509 Iron deficiency anemia, unspecified: Secondary | ICD-10-CM

## 2016-09-21 DIAGNOSIS — I10 Essential (primary) hypertension: Secondary | ICD-10-CM | POA: Diagnosis not present

## 2016-09-21 DIAGNOSIS — D508 Other iron deficiency anemias: Secondary | ICD-10-CM | POA: Diagnosis not present

## 2016-09-21 DIAGNOSIS — E7800 Pure hypercholesterolemia, unspecified: Secondary | ICD-10-CM | POA: Diagnosis not present

## 2016-09-21 DIAGNOSIS — K259 Gastric ulcer, unspecified as acute or chronic, without hemorrhage or perforation: Secondary | ICD-10-CM | POA: Diagnosis not present

## 2016-09-21 DIAGNOSIS — K2950 Unspecified chronic gastritis without bleeding: Secondary | ICD-10-CM | POA: Diagnosis not present

## 2016-09-21 DIAGNOSIS — Z8673 Personal history of transient ischemic attack (TIA), and cerebral infarction without residual deficits: Secondary | ICD-10-CM | POA: Diagnosis not present

## 2016-09-21 MED ORDER — IRON SUCROSE 20 MG/ML IV SOLN
200.0000 mg | Freq: Once | INTRAVENOUS | Status: AC
  Administered 2016-09-21: 200 mg via INTRAVENOUS
  Filled 2016-09-21: qty 10

## 2016-09-21 MED ORDER — SODIUM CHLORIDE 0.9 % IV SOLN
Freq: Once | INTRAVENOUS | Status: AC
  Administered 2016-09-21: 14:00:00 via INTRAVENOUS
  Filled 2016-09-21: qty 1000

## 2016-09-21 MED ORDER — SODIUM CHLORIDE 0.9 % IV SOLN: 200.0000 mg | Freq: Once | INTRAVENOUS | Status: DC

## 2016-09-23 ENCOUNTER — Other Ambulatory Visit: Payer: Self-pay | Admitting: Internal Medicine

## 2016-09-27 ENCOUNTER — Other Ambulatory Visit: Payer: Self-pay | Admitting: Oncology

## 2016-09-27 DIAGNOSIS — D509 Iron deficiency anemia, unspecified: Secondary | ICD-10-CM

## 2016-09-28 ENCOUNTER — Other Ambulatory Visit: Payer: Self-pay | Admitting: *Deleted

## 2016-09-28 ENCOUNTER — Inpatient Hospital Stay: Payer: Medicare Other

## 2016-09-28 ENCOUNTER — Encounter: Payer: Self-pay | Admitting: Oncology

## 2016-09-28 ENCOUNTER — Inpatient Hospital Stay: Payer: Medicare Other | Attending: Oncology | Admitting: Oncology

## 2016-09-28 VITALS — BP 128/58 | HR 54 | Temp 97.8°F | Wt 118.3 lb

## 2016-09-28 VITALS — BP 130/58 | HR 50 | Resp 18

## 2016-09-28 DIAGNOSIS — Z8719 Personal history of other diseases of the digestive system: Secondary | ICD-10-CM

## 2016-09-28 DIAGNOSIS — K219 Gastro-esophageal reflux disease without esophagitis: Secondary | ICD-10-CM | POA: Insufficient documentation

## 2016-09-28 DIAGNOSIS — E7800 Pure hypercholesterolemia, unspecified: Secondary | ICD-10-CM | POA: Insufficient documentation

## 2016-09-28 DIAGNOSIS — Z8673 Personal history of transient ischemic attack (TIA), and cerebral infarction without residual deficits: Secondary | ICD-10-CM | POA: Insufficient documentation

## 2016-09-28 DIAGNOSIS — Z8711 Personal history of peptic ulcer disease: Secondary | ICD-10-CM | POA: Diagnosis not present

## 2016-09-28 DIAGNOSIS — D509 Iron deficiency anemia, unspecified: Secondary | ICD-10-CM

## 2016-09-28 DIAGNOSIS — Z87891 Personal history of nicotine dependence: Secondary | ICD-10-CM | POA: Insufficient documentation

## 2016-09-28 DIAGNOSIS — Z79899 Other long term (current) drug therapy: Secondary | ICD-10-CM | POA: Diagnosis not present

## 2016-09-28 DIAGNOSIS — F329 Major depressive disorder, single episode, unspecified: Secondary | ICD-10-CM | POA: Diagnosis not present

## 2016-09-28 DIAGNOSIS — D649 Anemia, unspecified: Secondary | ICD-10-CM

## 2016-09-28 DIAGNOSIS — K449 Diaphragmatic hernia without obstruction or gangrene: Secondary | ICD-10-CM | POA: Diagnosis not present

## 2016-09-28 DIAGNOSIS — R5383 Other fatigue: Secondary | ICD-10-CM | POA: Insufficient documentation

## 2016-09-28 DIAGNOSIS — I10 Essential (primary) hypertension: Secondary | ICD-10-CM | POA: Insufficient documentation

## 2016-09-28 DIAGNOSIS — K259 Gastric ulcer, unspecified as acute or chronic, without hemorrhage or perforation: Secondary | ICD-10-CM | POA: Diagnosis not present

## 2016-09-28 DIAGNOSIS — N289 Disorder of kidney and ureter, unspecified: Secondary | ICD-10-CM

## 2016-09-28 LAB — CBC WITH DIFFERENTIAL/PLATELET
Basophils Absolute: 0.1 10*3/uL (ref 0–0.1)
Basophils Relative: 1 %
EOS ABS: 0 10*3/uL (ref 0–0.7)
Eosinophils Relative: 1 %
HCT: 30.5 % — ABNORMAL LOW (ref 35.0–47.0)
Hemoglobin: 10.1 g/dL — ABNORMAL LOW (ref 12.0–16.0)
LYMPHS ABS: 1 10*3/uL (ref 1.0–3.6)
LYMPHS PCT: 28 %
MCH: 26.8 pg (ref 26.0–34.0)
MCHC: 33.2 g/dL (ref 32.0–36.0)
MCV: 80.7 fL (ref 80.0–100.0)
MONO ABS: 0.3 10*3/uL (ref 0.2–0.9)
MONOS PCT: 8 %
Neutro Abs: 2.3 10*3/uL (ref 1.4–6.5)
Neutrophils Relative %: 62 %
PLATELETS: 195 10*3/uL (ref 150–440)
RBC: 3.78 MIL/uL — ABNORMAL LOW (ref 3.80–5.20)
RDW: 22.3 % — AB (ref 11.5–14.5)
WBC: 3.7 10*3/uL (ref 3.6–11.0)

## 2016-09-28 MED ORDER — SODIUM CHLORIDE 0.9 % IV SOLN
Freq: Once | INTRAVENOUS | Status: AC
  Administered 2016-09-28: 12:00:00 via INTRAVENOUS
  Filled 2016-09-28: qty 1000

## 2016-09-28 MED ORDER — SODIUM CHLORIDE 0.9 % IV SOLN: 200.0000 mg | Freq: Once | INTRAVENOUS | Status: DC

## 2016-09-28 MED ORDER — IRON SUCROSE 20 MG/ML IV SOLN
200.0000 mg | Freq: Once | INTRAVENOUS | Status: AC
  Administered 2016-09-28: 200 mg via INTRAVENOUS
  Filled 2016-09-28: qty 10

## 2016-09-28 NOTE — Progress Notes (Signed)
Hematology/Oncology Consult note [facility] Telephone:([phone] [phone]  CONSULT NOTE Patient Care Team: Einar Pheasant, MD as PCP - General (Internal Medicine)  CHIEF COMPLAINTS/PURPOSE OF CONSULTATION:     HISTORY OF PRESENTING ILLNESS:  Rachel Martinez 83 y.o.  female with past medical history listed as below who was referred to me by Dr.London for evaluation of anemia.  Patient has a history of gastritis. She had EGD done in 2015 April which showed normal esophagus, duodenum. Gastric ulcer was clean base, erosive gastritis biopsied. Biopsy showed reactive gastropathy and focal intestinal metaplasia. No active inflammation atrophy is plus CL or malignancy. H pylori was not identified. Patient follows up with primary care provider Dr. Nicki Reaper and had recent labs were done. She was found to be anemic with a hemoglobin of 8.1, which is down from 12 about a year ago. Differential was normal. WBC and platelet counts were normal. Iron panels were checked. Ferritin 7.4, iron 40, saturation ratios 8.2 transferritin 350.   Patient reports feeling profoundly fatigued. She is also quite stressed during this time as her husband just had a stroke. 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 She takes Plavix for stroke prevention. She denies any black stool or blood in the stool, stomach discomfort, abdominal pain, chest pain, shortness of breath. She started taking oral iron supplementation recently.  INTERVAL HISTORY Patient presents to discuss about the results. No new complaints. 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   ROS:  Review of Systems  Constitutional: Positive for fatigue.  HENT:  Negative.   Eyes: Negative.   Respiratory: Negative.   Cardiovascular: Negative.   Gastrointestinal: Negative.   Endocrine: Negative.   Genitourinary: Negative.      Musculoskeletal: Negative.   Skin: Negative.   Neurological: Negative.   Hematological: Negative.   Psychiatric/Behavioral: Negative.     MEDICAL HISTORY:  Past Medical History:  Diagnosis Date  . Anemia   . Chicken pox   . CVA (cerebral vascular accident) ([place]) 8/06  . Depression   . GERD (gastroesophageal reflux disease)   . Heart palpitations   . Hypercholesteremia   . Hypertension   . Stroke [facility])     SURGICAL HISTORY: Past Surgical History:  Procedure Laterality Date  . APPENDECTOMY      SOCIAL HISTORY: Social History   Social History  . Marital status: Married    Spouse name: N/A  . Number of children: N/A  . Years of education: N/A   Occupational History  . Not on file.   Social History Main Topics  . Smoking status: Former Smoker    Packs/day: 0.25    Years: 30.00    Quit date: 09/13/2003  . Smokeless tobacco: Never Used  . Alcohol use No  . Drug use: No  . Sexual activity: No   Other Topics Concern  . Not on file   Social History Narrative  . No narrative on file    FAMILY HISTORY: Family History  Problem Relation Age of Onset  . CVA Mother   . Diabetes Father   . Heart disease Father   . Cancer Sister        breast   . Cancer Brother        stomach  . Leukemia Brother   . Lung disease Brother   . Hypertension Sister   . Cancer Sister  stomach  . Mental illness Brother        suicide  . Breast cancer Paternal Aunt     ALLERGIES:  is allergic to amoxicillin-pot clavulanate; ciprofloxacin; codeine sulfate; imipenem; and metronidazole.  MEDICATIONS:  Current Outpatient Prescriptions  Medication Sig Dispense Refill  . calcium carbonate (TUMS - DOSED IN MG ELEMENTAL CALCIUM) 500 MG chewable tablet Chew 1 tablet by mouth 3 (three) times daily as needed.     . clopidogrel (PLAVIX) 75 MG tablet take 1 tablet by mouth once daily 30 tablet 5  . Cyanocobalamin (B-12 PO) Take by mouth.    . Fe Fum-FePoly-Vit C-Vit B3 (INTEGRA)  62.5-62.5-40-3 MG CAPS   1  . lisinopril (PRINIVIL,ZESTRIL) 10 MG tablet Take 1 tablet (10 mg total) by mouth daily. 30 tablet 5  . pravastatin (PRAVACHOL) 40 MG tablet take 1 tablet by mouth once daily 30 tablet 5  . sertraline (ZOLOFT) 50 MG tablet take 1 tablet by mouth once daily 30 tablet 2  . TAZTIA XT 120 MG 24 hr capsule take 1 capsule by mouth once daily 30 capsule 5   No current facility-administered medications for this visit.       Marland Kitchen  PHYSICAL EXAMINATION: ECOG PERFORMANCE STATUS: 1 - Symptomatic but completely ambulatory Vitals:   09/28/16 1057  BP: (!) 128/58  Pulse: (!) 54  Temp: 97.8 F (36.6 C)   Filed Weights   09/28/16 1057  Weight: 118 lb 5 oz (53.7 kg)    GENE, Pale, no distress and comfortable.  EYESNot icteric, conjunctivae pale PHARYNX: no thrush or ulceration; good dentition  NECK: supple, no masses felt LYMPH:  no palpable lymphadenopathy in the cervical, axillary or inguinal regions LUNGS: clear to auscultation and  No wheeze or crackles HEART/CVS: regular rate & rhythm and no murmurs; No lower extremity edema ABDOMEN: abdomen soft, non-tender and normal bowel sounds Musculoskeletal:no cyanosis of digits and no clubbing  PSYCH: alert & oriented x 3  NEURO: no focal motor/sensory deficits SKIN:  no rashes or significant lesions  LABORATORY DATA:  I have reviewed the data as listed CBC CBC    Component Value Date/Time   WBC 3.7 09/28/2016 1026   RBC 3.78 (L) 09/28/2016 1026   HGB 10.1 (L) 09/28/2016 1026   HGB 12.9 10/11/2013 1054   HCT 30.5 (L) 09/28/2016 1026   HCT 38.9 10/11/2013 1054   PLT 195 09/28/2016 1026   PLT 180 10/11/2013 1054   MCV 80.7 09/28/2016 1026   MCV 90 10/11/2013 1054   MCH 26.8 09/28/2016 1026   MCHC 33.2 09/28/2016 1026   RDW 22.3 (H) 09/28/2016 1026   RDW 14.7 (H) 10/11/2013 1054   LYMPHSABS 1.0 09/28/2016 1026   LYMPHSABS 1.4 10/11/2013 1054   MONOABS 0.3 09/28/2016 1026   MONOABS 0.3 10/11/2013 1054    EOSABS 0.0 09/28/2016 1026   EOSABS 0.0 10/11/2013 1054   BASOSABS 0.1 09/28/2016 1026   BASOSABS 0.0 10/11/2013 1054    Recent Labs  02/02/16 1345 08/30/16 1642  NA 143 142  K 4.4 4.5  CL 106 107  CO2 30 29  GLUCOSE 63* 89  BUN 20 28*  CREATININE 1.01 1.09  CALCIUM 9.1 8.7  PROT 6.3 6.3  ALBUMIN 4.0 4.1  AST 20 21  ALT 8 9  ALKPHOS 70 70  BILITOT 0.3 0.3  BILIDIR 0.1 0.1   Iron/TIBC/Ferritin/ %Sat    Component Value Date/Time   IRON 40 (L) 09/01/2016 1024   IRON 103 10/11/2013 1055  TIBC 354 10/11/2013 1055   FERRITIN 7.4 (L) 09/01/2016 1024   FERRITIN 40 10/11/2013 1054   IRONPCTSAT 8.2 (L) 09/01/2016 1024   IRONPCTSAT 29 10/11/2013 1055    RADIOGRAPHIC STUDIES: I have personally reviewed the radiological images as listed and agreed with the findings in the report. Dg Duanne Limerick W/small Bowel  Result Date: 09/13/2016 CLINICAL DATA:  Postprandial abdominal discomfort and burning sensation with some bloating. History of gastroesophageal reflux, previous gastric ulcer. Patient is undergoing evaluation and treatment for iron deficiency anemia. EXAM: UPPER GI SERIES WITH SMALL BOWEL FOLLOW-THROUGH using barium FLUOROSCOPY TIME:  Fluoroscopy Time:  2 minutes, 18 seconds. Radiation Exposure Index (if provided by the fluoroscopic device): 1464 micro Gy per meters square Number of Acquired Spot Images: 13 fluoro spot images. Two scout images. 3 overhead images. TECHNIQUE: Combined double contrast and single contrast upper GI series using effervescent crystals, thick barium, and thin barium. Subsequently, serial images of the small bowel were obtained including spot views of the terminal ileum. COMPARISON:  Upper GI series of February 17, 2011 FINDINGS: The scout radiograph reveals a normal bowel gas pattern. There are mild degenerative changes of the lower lumbar spine. The patient ingested the thick and thin barium and the gas-forming crystals without difficulty. The thoracic esophagus  was normal in a survey fashion. A small amount of gastroesophageal reflux was observed. A small reducible hiatal hernia was demonstrated. The stomach distended well. The mucosal fold pattern appeared normal. No ulcer niche was observed. Gastric emptying was prompt. The duodenal bulb and Celsius sweep were normal in appearance and position. The barium tablet passed promptly from the mouth to the stomach. Over the course of approximately 40 minutes the barium passed through the small bowel and reached the right colon. The jejunum demonstrated a normal feathery mucosal pattern. The ileum also demonstrated a normal appearing mucosal pattern. No abnormally dilated or narrowed loops of bowel were observed. No abnormal nodularity was observed. The terminal ileum demonstrated normal peristalsis. IMPRESSION: Small reducible hiatal hernia with small amount of gastroesophageal reflux. No evidence of stricture nor esophagitis. Normal appearance of the stomach and duodenum. Normal appearance of the jejunum and ileum. Electronically Signed   By: David  Martinique M.D.   On: 09/13/2016 11:47    ASSESSMENT & PLAN:  1. Iron deficiency anemia, unspecified iron deficiency anemia type   2. Fatigue associated with anemia   3. History of gastritis    S/p 1 dose of Venofer, Hb responds very well, up to 10. Finish another 4 weekly IV iron infusion with Venofer.   # source of blood loss, s/p EGD. Will obtain EGD reports.  All questions were answered. The patient knows to call the clinic with any problems questions or concerns.  Return of visit: 2 months with repeat cbc, iron ferritin.  Earlie Server, MD, PhD Hematology Oncology [facility] Pager- [phone] 09/28/2016

## 2016-09-28 NOTE — Progress Notes (Signed)
Patient here today for follow up.  Patient states no new concerns today  

## 2016-09-29 LAB — PROTEIN ELECTROPHORESIS, SERUM
A/G RATIO SPE: 1.5 (ref 0.7–1.7)
ALBUMIN ELP: 3.7 g/dL (ref 2.9–4.4)
Alpha-1-Globulin: 0.2 g/dL (ref 0.0–0.4)
Alpha-2-Globulin: 0.7 g/dL (ref 0.4–1.0)
Beta Globulin: 0.8 g/dL (ref 0.7–1.3)
GLOBULIN, TOTAL: 2.5 g/dL (ref 2.2–3.9)
Gamma Globulin: 0.7 g/dL (ref 0.4–1.8)
Total Protein ELP: 6.2 g/dL (ref 6.0–8.5)

## 2016-09-29 LAB — KAPPA/LAMBDA LIGHT CHAINS
KAPPA FREE LGHT CHN: 22.7 mg/L — AB (ref 3.3–19.4)
KAPPA, LAMDA LIGHT CHAIN RATIO: 1.53 (ref 0.26–1.65)
Lambda free light chains: 14.8 mg/L (ref 5.7–26.3)

## 2016-10-05 ENCOUNTER — Inpatient Hospital Stay: Payer: Medicare Other

## 2016-10-05 VITALS — BP 128/69 | HR 50 | Temp 97.7°F | Resp 18

## 2016-10-05 DIAGNOSIS — E7800 Pure hypercholesterolemia, unspecified: Secondary | ICD-10-CM | POA: Diagnosis not present

## 2016-10-05 DIAGNOSIS — I10 Essential (primary) hypertension: Secondary | ICD-10-CM | POA: Diagnosis not present

## 2016-10-05 DIAGNOSIS — D509 Iron deficiency anemia, unspecified: Secondary | ICD-10-CM | POA: Diagnosis not present

## 2016-10-05 DIAGNOSIS — K219 Gastro-esophageal reflux disease without esophagitis: Secondary | ICD-10-CM | POA: Diagnosis not present

## 2016-10-05 DIAGNOSIS — K259 Gastric ulcer, unspecified as acute or chronic, without hemorrhage or perforation: Secondary | ICD-10-CM | POA: Diagnosis not present

## 2016-10-05 DIAGNOSIS — R5383 Other fatigue: Secondary | ICD-10-CM | POA: Diagnosis not present

## 2016-10-05 MED ORDER — SODIUM CHLORIDE 0.9 % IV SOLN: 200.0000 mg | Freq: Once | INTRAVENOUS | Status: DC

## 2016-10-05 MED ORDER — SODIUM CHLORIDE 0.9 % IV SOLN
Freq: Once | INTRAVENOUS | Status: AC
  Administered 2016-10-05: 14:00:00 via INTRAVENOUS
  Filled 2016-10-05: qty 1000

## 2016-10-05 MED ORDER — IRON SUCROSE 20 MG/ML IV SOLN
200.0000 mg | Freq: Once | INTRAVENOUS | Status: AC
  Administered 2016-10-05: 200 mg via INTRAVENOUS
  Filled 2016-10-05: qty 10

## 2016-10-12 ENCOUNTER — Inpatient Hospital Stay: Payer: Medicare Other

## 2016-10-12 VITALS — BP 143/60 | HR 56 | Resp 20

## 2016-10-12 DIAGNOSIS — K219 Gastro-esophageal reflux disease without esophagitis: Secondary | ICD-10-CM | POA: Diagnosis not present

## 2016-10-12 DIAGNOSIS — R5383 Other fatigue: Secondary | ICD-10-CM | POA: Diagnosis not present

## 2016-10-12 DIAGNOSIS — D509 Iron deficiency anemia, unspecified: Secondary | ICD-10-CM

## 2016-10-12 DIAGNOSIS — E7800 Pure hypercholesterolemia, unspecified: Secondary | ICD-10-CM | POA: Diagnosis not present

## 2016-10-12 DIAGNOSIS — K259 Gastric ulcer, unspecified as acute or chronic, without hemorrhage or perforation: Secondary | ICD-10-CM | POA: Diagnosis not present

## 2016-10-12 DIAGNOSIS — I10 Essential (primary) hypertension: Secondary | ICD-10-CM | POA: Diagnosis not present

## 2016-10-12 MED ORDER — IRON SUCROSE 20 MG/ML IV SOLN
200.0000 mg | Freq: Once | INTRAVENOUS | Status: AC
  Administered 2016-10-12: 200 mg via INTRAVENOUS
  Filled 2016-10-12: qty 10

## 2016-10-12 MED ORDER — SODIUM CHLORIDE 0.9 % IV SOLN
Freq: Once | INTRAVENOUS | Status: AC
  Administered 2016-10-12: 14:00:00 via INTRAVENOUS
  Filled 2016-10-12: qty 1000

## 2016-10-18 ENCOUNTER — Other Ambulatory Visit: Payer: Self-pay | Admitting: Internal Medicine

## 2016-10-19 ENCOUNTER — Inpatient Hospital Stay: Payer: Medicare Other

## 2016-10-19 VITALS — BP 127/68 | HR 51 | Temp 97.9°F | Resp 16

## 2016-10-19 DIAGNOSIS — K219 Gastro-esophageal reflux disease without esophagitis: Secondary | ICD-10-CM | POA: Diagnosis not present

## 2016-10-19 DIAGNOSIS — D509 Iron deficiency anemia, unspecified: Secondary | ICD-10-CM | POA: Diagnosis not present

## 2016-10-19 DIAGNOSIS — I10 Essential (primary) hypertension: Secondary | ICD-10-CM | POA: Diagnosis not present

## 2016-10-19 DIAGNOSIS — K259 Gastric ulcer, unspecified as acute or chronic, without hemorrhage or perforation: Secondary | ICD-10-CM | POA: Diagnosis not present

## 2016-10-19 DIAGNOSIS — R5383 Other fatigue: Secondary | ICD-10-CM | POA: Diagnosis not present

## 2016-10-19 DIAGNOSIS — E7800 Pure hypercholesterolemia, unspecified: Secondary | ICD-10-CM | POA: Diagnosis not present

## 2016-10-19 MED ORDER — SODIUM CHLORIDE 0.9 % IV SOLN
Freq: Once | INTRAVENOUS | Status: AC
  Administered 2016-10-19: 14:00:00 via INTRAVENOUS
  Filled 2016-10-19: qty 1000

## 2016-10-19 MED ORDER — IRON SUCROSE 20 MG/ML IV SOLN
200.0000 mg | Freq: Once | INTRAVENOUS | Status: AC
  Administered 2016-10-19: 200 mg via INTRAVENOUS
  Filled 2016-10-19: qty 10

## 2016-10-22 ENCOUNTER — Emergency Department: Payer: Medicare Other

## 2016-10-22 ENCOUNTER — Encounter: Payer: Self-pay | Admitting: *Deleted

## 2016-10-22 ENCOUNTER — Observation Stay: Payer: Medicare Other

## 2016-10-22 ENCOUNTER — Observation Stay
Admission: EM | Admit: 2016-10-22 | Discharge: 2016-10-24 | Disposition: A | Discharge status: Home / Self Care | Payer: Medicare Other | Attending: Internal Medicine | Admitting: Internal Medicine

## 2016-10-22 ENCOUNTER — Other Ambulatory Visit: Payer: Self-pay

## 2016-10-22 DIAGNOSIS — R4182 Altered mental status, unspecified: Secondary | ICD-10-CM | POA: Diagnosis not present

## 2016-10-22 DIAGNOSIS — N289 Disorder of kidney and ureter, unspecified: Secondary | ICD-10-CM | POA: Insufficient documentation

## 2016-10-22 DIAGNOSIS — I10 Essential (primary) hypertension: Secondary | ICD-10-CM | POA: Diagnosis not present

## 2016-10-22 DIAGNOSIS — K219 Gastro-esophageal reflux disease without esophagitis: Secondary | ICD-10-CM | POA: Insufficient documentation

## 2016-10-22 DIAGNOSIS — R2681 Unsteadiness on feet: Secondary | ICD-10-CM | POA: Insufficient documentation

## 2016-10-22 DIAGNOSIS — Z8673 Personal history of transient ischemic attack (TIA), and cerebral infarction without residual deficits: Secondary | ICD-10-CM | POA: Diagnosis not present

## 2016-10-22 DIAGNOSIS — Z823 Family history of stroke: Secondary | ICD-10-CM | POA: Diagnosis not present

## 2016-10-22 DIAGNOSIS — R001 Bradycardia, unspecified: Secondary | ICD-10-CM | POA: Diagnosis not present

## 2016-10-22 DIAGNOSIS — Z880 Allergy status to penicillin: Secondary | ICD-10-CM | POA: Insufficient documentation

## 2016-10-22 DIAGNOSIS — E43 Unspecified severe protein-calorie malnutrition: Secondary | ICD-10-CM | POA: Insufficient documentation

## 2016-10-22 DIAGNOSIS — I700 Atherosclerosis of aorta: Secondary | ICD-10-CM | POA: Insufficient documentation

## 2016-10-22 DIAGNOSIS — E785 Hyperlipidemia, unspecified: Secondary | ICD-10-CM | POA: Diagnosis not present

## 2016-10-22 DIAGNOSIS — I672 Cerebral atherosclerosis: Secondary | ICD-10-CM | POA: Insufficient documentation

## 2016-10-22 DIAGNOSIS — F329 Major depressive disorder, single episode, unspecified: Secondary | ICD-10-CM | POA: Diagnosis not present

## 2016-10-22 DIAGNOSIS — R410 Disorientation, unspecified: Secondary | ICD-10-CM | POA: Diagnosis not present

## 2016-10-22 DIAGNOSIS — R531 Weakness: Secondary | ICD-10-CM | POA: Diagnosis not present

## 2016-10-22 DIAGNOSIS — F0390 Unspecified dementia without behavioral disturbance: Secondary | ICD-10-CM | POA: Diagnosis not present

## 2016-10-22 DIAGNOSIS — E7800 Pure hypercholesterolemia, unspecified: Secondary | ICD-10-CM | POA: Insufficient documentation

## 2016-10-22 DIAGNOSIS — R1111 Vomiting without nausea: Secondary | ICD-10-CM | POA: Diagnosis not present

## 2016-10-22 DIAGNOSIS — Z79899 Other long term (current) drug therapy: Secondary | ICD-10-CM | POA: Insufficient documentation

## 2016-10-22 DIAGNOSIS — Z87891 Personal history of nicotine dependence: Secondary | ICD-10-CM | POA: Diagnosis not present

## 2016-10-22 DIAGNOSIS — W19XXXA Unspecified fall, initial encounter: Secondary | ICD-10-CM | POA: Diagnosis not present

## 2016-10-22 DIAGNOSIS — S0990XA Unspecified injury of head, initial encounter: Secondary | ICD-10-CM | POA: Diagnosis not present

## 2016-10-22 DIAGNOSIS — D509 Iron deficiency anemia, unspecified: Secondary | ICD-10-CM | POA: Insufficient documentation

## 2016-10-22 DIAGNOSIS — I639 Cerebral infarction, unspecified: Secondary | ICD-10-CM | POA: Diagnosis not present

## 2016-10-22 DIAGNOSIS — Z9181 History of falling: Secondary | ICD-10-CM | POA: Insufficient documentation

## 2016-10-22 DIAGNOSIS — Z23 Encounter for immunization: Secondary | ICD-10-CM | POA: Diagnosis not present

## 2016-10-22 DIAGNOSIS — R000 Tachycardia, unspecified: Secondary | ICD-10-CM | POA: Diagnosis not present

## 2016-10-22 LAB — CBC WITH DIFFERENTIAL/PLATELET
Basophils Absolute: 0 10*3/uL (ref 0–0.1)
Basophils Relative: 0 %
EOS ABS: 0 10*3/uL (ref 0–0.7)
Eosinophils Relative: 0 %
HEMATOCRIT: 37.8 % (ref 35.0–47.0)
HEMOGLOBIN: 12.6 g/dL (ref 12.0–16.0)
LYMPHS ABS: 0.4 10*3/uL — AB (ref 1.0–3.6)
Lymphocytes Relative: 4 %
MCH: 27.9 pg (ref 26.0–34.0)
MCHC: 33.2 g/dL (ref 32.0–36.0)
MCV: 84.1 fL (ref 80.0–100.0)
MONOS PCT: 2 %
Monocytes Absolute: 0.2 10*3/uL (ref 0.2–0.9)
NEUTROS PCT: 94 %
Neutro Abs: 10.2 10*3/uL — ABNORMAL HIGH (ref 1.4–6.5)
Platelets: 152 10*3/uL (ref 150–440)
RBC: 4.5 MIL/uL (ref 3.80–5.20)
RDW: 23.6 % — ABNORMAL HIGH (ref 11.5–14.5)
WBC: 10.8 10*3/uL (ref 3.6–11.0)

## 2016-10-22 LAB — BASIC METABOLIC PANEL
Anion gap: 7 (ref 5–15)
BUN: 23 mg/dL — AB (ref 6–20)
CALCIUM: 8.9 mg/dL (ref 8.9–10.3)
CO2: 27 mmol/L (ref 22–32)
Chloride: 109 mmol/L (ref 101–111)
Creatinine, Ser: 1.01 mg/dL — ABNORMAL HIGH (ref 0.44–1.00)
GFR calc Af Amer: 56 mL/min — ABNORMAL LOW (ref >60)
GFR, EST NON AFRICAN AMERICAN: 49 mL/min — AB (ref >60)
GLUCOSE: 119 mg/dL — AB (ref 65–99)
Potassium: 4.2 mmol/L (ref 3.5–5.1)
Sodium: 143 mmol/L (ref 135–145)

## 2016-10-22 LAB — HEPATIC FUNCTION PANEL
ALK PHOS: 59 U/L (ref 38–126)
ALT: 13 U/L — AB (ref 14–54)
AST: 33 U/L (ref 15–41)
Albumin: 3.9 g/dL (ref 3.5–5.0)
BILIRUBIN DIRECT: 0.1 mg/dL (ref 0.1–0.5)
Indirect Bilirubin: 0.3 mg/dL (ref 0.3–0.9)
Total Bilirubin: 0.4 mg/dL (ref 0.3–1.2)
Total Protein: 6.5 g/dL (ref 6.5–8.1)

## 2016-10-22 LAB — URINALYSIS, COMPLETE (UACMP) WITH MICROSCOPIC
BILIRUBIN URINE: NEGATIVE
Bacteria, UA: NONE SEEN
GLUCOSE, UA: NEGATIVE mg/dL
HGB URINE DIPSTICK: NEGATIVE
Ketones, ur: 5 mg/dL — AB
NITRITE: NEGATIVE
PH: 5 (ref 5.0–8.0)
Protein, ur: NEGATIVE mg/dL
SPECIFIC GRAVITY, URINE: 1.02 (ref 1.005–1.030)

## 2016-10-22 LAB — LACTIC ACID, PLASMA
LACTIC ACID, VENOUS: 1.4 mmol/L (ref 0.5–1.9)
LACTIC ACID, VENOUS: 1 mmol/L (ref 0.5–1.9)

## 2016-10-22 LAB — TROPONIN I: Troponin I: <0.03 ng/mL (ref <0.03)

## 2016-10-22 LAB — TSH: TSH: 1.747 u[IU]/mL (ref 0.350–4.500)

## 2016-10-22 LAB — CK: Total CK: 217 U/L (ref 38–234)

## 2016-10-22 LAB — ETHANOL: Alcohol, Ethyl (B): <10 mg/dL (ref <10)

## 2016-10-22 MED ORDER — ACETAMINOPHEN 650 MG RE SUPP: 650.0000 mg | Freq: Four times a day (QID) | RECTAL | Status: DC | PRN

## 2016-10-22 MED ORDER — SODIUM CHLORIDE 0.9 % IV SOLN
Freq: Once | INTRAVENOUS | Status: AC
  Administered 2016-10-22: 21:00:00 via INTRAVENOUS

## 2016-10-22 MED ORDER — ONDANSETRON HCL 4 MG/2ML IJ SOLN
4.0000 mg | Freq: Once | INTRAMUSCULAR | Status: AC
  Administered 2016-10-22: 4 mg via INTRAVENOUS
  Filled 2016-10-22: qty 2

## 2016-10-22 MED ORDER — ONDANSETRON HCL 4 MG PO TABS: 4.0000 mg | ORAL_TABLET | Freq: Four times a day (QID) | ORAL | Status: DC | PRN

## 2016-10-22 MED ORDER — PNEUMOCOCCAL VAC POLYVALENT 25 MCG/0.5ML IJ INJ
0.5000 mL | INJECTION | INTRAMUSCULAR | Status: AC
  Administered 2016-10-23: 0.5 mL via INTRAMUSCULAR
  Filled 2016-10-22: qty 0.5

## 2016-10-22 MED ORDER — ENOXAPARIN SODIUM 40 MG/0.4ML ~~LOC~~ SOLN
40.0000 mg | SUBCUTANEOUS | Status: DC
  Administered 2016-10-23: 40 mg via SUBCUTANEOUS
  Filled 2016-10-22: qty 0.4

## 2016-10-22 MED ORDER — SODIUM CHLORIDE 0.9 % IV BOLUS (SEPSIS)
1000.0000 mL | Freq: Once | INTRAVENOUS | Status: AC
  Administered 2016-10-22: 1000 mL via INTRAVENOUS

## 2016-10-22 MED ORDER — INFLUENZA VAC SPLIT HIGH-DOSE 0.5 ML IM SUSY
0.5000 mL | PREFILLED_SYRINGE | INTRAMUSCULAR | Status: AC
  Administered 2016-10-23: 0.5 mL via INTRAMUSCULAR
  Filled 2016-10-22 (×2): qty 0.5

## 2016-10-22 MED ORDER — ONDANSETRON HCL 4 MG/2ML IJ SOLN: 4.0000 mg | Freq: Four times a day (QID) | INTRAMUSCULAR | Status: DC | PRN

## 2016-10-22 MED ORDER — ACETAMINOPHEN 325 MG PO TABS: 650.0000 mg | ORAL_TABLET | Freq: Four times a day (QID) | ORAL | Status: DC | PRN

## 2016-10-22 NOTE — ED Triage Notes (Signed)
Pt arrives via EMS from home, son found her and husband on the floor, per EMS pt fell last night sometime and had unknown downtime, arrives somulant, eyes closed, minimal responsiveness, EDP at bedside

## 2016-10-22 NOTE — ED Provider Notes (Signed)
[facility] Emergency Department Provider Note  ____________________________________________   First MD Initiated Contact with Patient 10/22/16 1414     (approximate)  I have reviewed the triage vital signs and the nursing notes.   HISTORY  Chief Complaint Fall and Altered Mental Status  level V exemption history Limited by the patient's altered mental status  HPI Rachel Martinez is a 83 y.o. female who comes to the emergency department via EMS after being found down at home. Apparently last night sometime around dinnertime the patient fell onto the ground and her husband attempted to get her up however he fell on top of her as well and neither person was able to get up on their own and only came to the hospital today when her son came to visit and found them both collapsed on the floor. The patient arrives somnolent but arousable only to painful stimulus.   Past Medical History:  Diagnosis Date  . Anemia   . Chicken pox   . CVA (cerebral vascular accident) ([place]) 8/06  . Depression   . GERD (gastroesophageal reflux disease)   . Heart palpitations   . Hypercholesteremia   . Hypertension   . Stroke [facility])     Patient Active Problem List   Diagnosis Date Noted  . Health care maintenance 05/25/2015  . Fall 06/29/2014  . Gastric ulcer 05/22/2013  . Abdominal bruit 05/10/2013  . Breast nodule 05/10/2013  . Heme positive stool 05/10/2013  . Light headedness 05/10/2013  . Right shoulder pain 12/16/2012  . Stress 12/16/2012  . Renal insufficiency 01/11/2012  . Iron deficiency anemia 01/11/2012  . History of CVA (cerebrovascular accident) 01/10/2012  . Hypertension 01/10/2012  . Hypercholesterolemia 01/10/2012    Past Surgical History:  Procedure Laterality Date  . APPENDECTOMY      Prior to Admission medications   Medication Sig Start Date End Date Taking? Authorizing Provider  clopidogrel (PLAVIX) 75 MG tablet take 1 tablet by mouth once  daily 05/10/16  Yes Einar Pheasant, MD  Fe Fum-FePoly-Vit C-Vit B3 (INTEGRA) 62.5-62.5-40-3 MG CAPS Take 1 capsule by mouth daily.  09/07/16  Yes [provider]  lisinopril (PRINIVIL,ZESTRIL) 10 MG tablet Take 1 tablet (10 mg total) by mouth daily. 07/10/12  Yes Einar Pheasant, MD  pravastatin (PRAVACHOL) 40 MG tablet take 1 tablet by mouth once daily 10/18/16  Yes Einar Pheasant, MD  sertraline (ZOLOFT) 50 MG tablet take 1 tablet by mouth once daily 09/23/16  Yes Einar Pheasant, MD  calcium carbonate (TUMS - DOSED IN MG ELEMENTAL CALCIUM) 500 MG chewable tablet Chew 1 tablet by mouth 3 (three) times daily as needed.     [provider]  TAZTIA XT 120 MG 24 hr capsule take 1 capsule by mouth once daily Patient not taking: Reported on 10/22/2016 04/14/16   Einar Pheasant, MD    Allergies Amoxicillin-pot clavulanate; Ciprofloxacin; Codeine sulfate; Imipenem; and Metronidazole  Family History  Problem Relation Age of Onset  . CVA Mother   . Diabetes Father   . Heart disease Father   . Cancer Sister        breast   . Cancer Brother        stomach  . Leukemia Brother   . Lung disease Brother   . Hypertension Sister   . Cancer Sister        stomach  . Mental illness Brother        suicide  . Breast cancer Paternal Aunt  Social History Social History  Substance Use Topics  . Smoking status: Former Smoker    Packs/day: 0.25    Years: 30.00    Quit date: 09/13/2003  . Smokeless tobacco: Never Used  . Alcohol use No    Review of Systems level V exemption history Limited by the patient's clinical condition ____________________________________________   PHYSICAL EXAM:  VITAL SIGNS: ED Triage Vitals [10/22/16 1412]  Enc Vitals Group     BP      Pulse      Resp      Temp      Temp src      SpO2      Weight      Height      Head Circumference      Peak Flow      Pain Score 0     Pain Loc      Pain Edu?      Excl. in [place]?     Constitutional:  responsive to painful stimulus but only localizing nonverbal GCS [id number] total 7 Eyes: PERRL EOMI. pupils 3 mm to 2 mm and sluggish Head: Atraumatic. Nose: No congestion/rhinnorhea. Mouth/Throat: No trismus Neck: No stridor.   Cardiovascular: Normal rate, regular rhythm. Grossly normal heart sounds.  Good peripheral circulation. Respiratory: Normal respiratory effort.  No retractions. Lungs CTAB and moving good air Gastrointestinal: soft nontender Musculoskeletal: No lower extremity edema   Neurologic:  localizes to painful stimulus all 4 extremities Skin:  Skin is warm, dry and intact. No rash noted. Psychiatric: comatose    ____________________________________________   DIFFERENTIAL includes but not limited to  intracerebral hemorrhage, stroke, encephalitis, metabolic derangement, urinary tract infection, sepsis, rhabdomyolysis ____________________________________________   LABS (all labs ordered are listed, but only abnormal results are displayed)  Labs Reviewed  BASIC METABOLIC PANEL - Abnormal; Notable for the following:       Result Value   Glucose, Bld 119 (*)    BUN 23 (*)    Creatinine, Ser 1.01 (*)    GFR calc non Af Amer 49 (*)    GFR calc Af Amer 56 (*)    All other components within normal limits  HEPATIC FUNCTION PANEL - Abnormal; Notable for the following:    ALT 13 (*)    All other components within normal limits  CBC WITH DIFFERENTIAL/PLATELET - Abnormal; Notable for the following:    RDW 23.6 (*)    Neutro Abs 10.2 (*)    Lymphs Abs 0.4 (*)    All other components within normal limits  URINALYSIS, COMPLETE (UACMP) WITH MICROSCOPIC - Abnormal; Notable for the following:    Color, Urine YELLOW (*)    APPearance HAZY (*)    Ketones, ur 5 (*)    Leukocytes, UA SMALL (*)    Squamous Epithelial / LPF 0-5 (*)    All other components within normal limits  ETHANOL  TROPONIN I  LACTIC ACID, PLASMA  CK  TSH  LACTIC ACID, PLASMA    Blood work reviewed  and interpreted by me unremarkable no signs of urinary tract infection __________________________________________  EKG  ED ECG REPORT I, Darel Hong, the attending physician, personally viewed and interpreted this ECG.  Date: 10/22/2016 EKG Time:  Rate: 52 Rhythm: sinus bradycardia QRS Axis: normal Intervals: normal ST/T Wave abnormalities: normal Narrative Interpretation: no evidence of acute ischemia  ____________________________________________  RADIOLOGY  head CT reviewed by me shows no acute disease ____________________________________________   PROCEDURES  Procedure(s) performed: no  Procedures  Critical  Care performed: no  Observation: no ____________________________________________   INITIAL IMPRESSION / ASSESSMENT AND PLAN / ED COURSE  Pertinent labs & imaging results that were available during my care of the patient were reviewed by me and considered in my medical decision making (see chart for details).  The patient arrives obtunded with low GCS although is clearly protecting her airway. Differentials extremely broad and includes intracerebral hemorrhage, encephalitis, sepsis, and antecedent acute coronary syndrome causing the fall in the first place. Broad labs and head CT are pending. In and out catheterization as well.     ----------------------------------------- 3:30 PM on 10/22/2016 -----------------------------------------  Care signed over to Dr. Cinda Quest who will determine ultimate disposition. ____________________________________________   FINAL CLINICAL IMPRESSION(S) / ED DIAGNOSES  Final diagnoses:  Altered mental status, unspecified altered mental status type      NEW MEDICATIONS STARTED DURING THIS VISIT:  New Prescriptions   No medications on file     Note:  This document was prepared using Dragon voice recognition software and may include unintentional dictation errors.     Darel Hong, MD 10/22/16 1530

## 2016-10-22 NOTE — ED Notes (Signed)
Pt vomited on the way back from CT, EDP aware, pt returned to room, family at bedside

## 2016-10-22 NOTE — H&P (Addendum)
[facility] [place] NAME: Rachel Martinez    MR#:  [id number]  DATE OF BIRTH:  1927-05-24  DATE OF ADMISSION:  10/22/2016  PRIMARY CARE PHYSICIAN: Einar Pheasant, MD   REQUESTING/REFERRING PHYSICIAN: Dr Mable Paris  CHIEF COMPLAINT:   Altered mental status Patient is unable to provide any history or previous system. Family members in the emergency room gave some history.  HISTORY OF PRESENT ILLNESS:  Rachel Martinez  is a 83 y.o. female with a known history of CVA, chronic anemia undergoing IV iron infusion at the cancer center, depression, GERD comes to the emergency room after she was found collapsed down on the floor. Patient's husband who has dementia was attempting to help her get up and he fell on top of a neither were able to get up till son went to check on them and found down on the floor. Patient arrived somnolent. He she woke up momentarily however did not have any meaningful conversation with family. No trauma. CT head negative for CVA. Patient's vitals are stable. Heart rate is 50-55. She responds to sternal rub. Patient is not communicating at present.  PAST MEDICAL HISTORY:   Past Medical History:  Diagnosis Date  . Anemia   . Chicken pox   . CVA (cerebral vascular accident) ([place]) 8/06  . Depression   . GERD (gastroesophageal reflux disease)   . Heart palpitations   . Hypercholesteremia   . Hypertension   . Stroke [facility])     PAST SURGICAL HISTOIRY:   Past Surgical History:  Procedure Laterality Date  . APPENDECTOMY      SOCIAL HISTORY:   Social History  Substance Use Topics  . Smoking status: Former Smoker    Packs/day: 0.25    Years: 30.00    Quit date: 09/13/2003  . Smokeless tobacco: Never Used  . Alcohol use No    FAMILY HISTORY:   Family History  Problem Relation Age of Onset  . CVA Mother   . Diabetes Father   . Heart disease Father   . Cancer Sister        breast   . Cancer Brother      stomach  . Leukemia Brother   . Lung disease Brother   . Hypertension Sister   . Cancer Sister        stomach  . Mental illness Brother        suicide  . Breast cancer Paternal Aunt     DRUG ALLERGIES:   Allergies  Allergen Reactions  . Amoxicillin-Pot Clavulanate     Other reaction(s): Unknown  . Ciprofloxacin     Other reaction(s): Unknown  . Codeine Sulfate   . Imipenem     Other reaction(s): Unknown  . Metronidazole     Other reaction(s): Unknown    REVIEW OF SYSTEMS:  Review of Systems  Unable to perform ROS: Mental acuity     MEDICATIONS AT HOME:   Prior to Admission medications   Medication Sig Start Date End Date Taking? Authorizing Provider  clopidogrel (PLAVIX) 75 MG tablet take 1 tablet by mouth once daily 05/10/16  Yes Einar Pheasant, MD  Fe Fum-FePoly-Vit C-Vit B3 (INTEGRA) 62.5-62.5-40-3 MG CAPS Take 1 capsule by mouth daily.  09/07/16  Yes [provider]  lisinopril (PRINIVIL,ZESTRIL) 10 MG tablet Take 1 tablet (10 mg total) by mouth daily. 07/10/12  Yes Einar Pheasant, MD  pravastatin (PRAVACHOL) 40 MG tablet take 1 tablet by mouth once daily 10/18/16  Yes Einar Pheasant, MD  sertraline (ZOLOFT) 50 MG tablet take 1 tablet by mouth once daily 09/23/16  Yes Einar Pheasant, MD  calcium carbonate (TUMS - DOSED IN MG ELEMENTAL CALCIUM) 500 MG chewable tablet Chew 1 tablet by mouth 3 (three) times daily as needed.     [provider]  TAZTIA XT 120 MG 24 hr capsule take 1 capsule by mouth once daily Patient not taking: Reported on 10/22/2016 04/14/16   Einar Pheasant, MD      VITAL SIGNS:  Blood pressure (!) 150/57, pulse (!) 56, temperature 97.9 F (36.6 C), temperature source Oral, resp. rate 18, height 5\' 2"  (1.575 m), weight 49.9 kg (110 lb), SpO2 97 %.  PHYSICAL EXAMINATION:  GENERAL:  83 y.o.-year-old patient lying in the bed with no acute distress.  EYES: Pupils equal, round, reactive to light and accommodation. No scleral  icterus. Extraocular muscles intact.  HEENT: Head atraumatic, normocephalic. Oropharynx and nasopharynx clear.  NECK:  Supple, no jugular venous distention. No thyroid enlargement, no tenderness.  LUNGS: Normal breath sounds bilaterally, no wheezing, rales,rhonchi or crepitation. No use of accessory muscles of respiration.  CARDIOVASCULAR: S1, S2 normal. No murmurs, rubs, or gallops.  ABDOMEN: Soft, nontender, nondistended. Bowel sounds present. No organomegaly or mass.  EXTREMITIES: No pedal edema, cyanosis, or clubbing.  NEUROLOGIC: altered mental status. Unable to assess. Moves extremities spontaneously No focal deficit noted PSYCHIATRIC: responds only to sternal rub. Non communicative.  SKIN: No obvious rash, lesion, or ulcer.   LABORATORY PANEL:   CBC  Recent Labs Lab 10/22/16 1415  WBC 10.8  HGB 12.6  HCT 37.8  PLT 152   ------------------------------------------------------------------------------------------------------------------  Chemistries   Recent Labs Lab 10/22/16 1415  NA 143  K 4.2  CL 109  CO2 27  GLUCOSE 119*  BUN 23*  CREATININE 1.01*  CALCIUM 8.9  AST 33  ALT 13*  ALKPHOS 59  BILITOT 0.4   ------------------------------------------------------------------------------------------------------------------  Cardiac Enzymes  Recent Labs Lab 10/22/16 1415  TROPONINI <0.03   ------------------------------------------------------------------------------------------------------------------  RADIOLOGY:  Ct Head Wo Contrast  Result Date: 10/22/2016 CLINICAL DATA:  83 year old female with a history of fall EXAM: CT HEAD WITHOUT CONTRAST TECHNIQUE: Contiguous axial images were obtained from the base of the skull through the vertex without intravenous contrast. COMPARISON:  Head CT 08/15/2004, MR 08/16/2004 FINDINGS: Brain: No acute intracranial hemorrhage. No midline shift or mass effect. Calcifications of the bilateral basal ganglia. Confluent  hypodensity in the subcortical white matter of the parietal region extending to the margin of the ventricle. There is hypodensity within the white matter of the frontal region, and to a lesser degree within the right frontal region. Gray-white differentiation is maintained. Vascular: Calcifications of the intracranial vasculature. Skull: No fracture.  No aggressive bony lesion. Sinuses/Orbits: Calcified mass within the region the frontal sinus extending through the expected location of the inferior recess, compatible with paranasal sinus osteoma. Other: None IMPRESSION: No CT evidence of acute intracranial abnormality. Similar appearance of left greater than right chronic white matter disease. Intracranial atherosclerosis. Frontal sinus paranasal sinus osteoma. Electronically Signed   By: Corrie Mckusick D.O.   On: 10/22/2016 14:51   Dg Chest Port 1 View  Result Date: 10/22/2016 CLINICAL DATA:  Bradycardia. EXAM: PORTABLE CHEST 1 VIEW COMPARISON:  None. FINDINGS: The heart size and mediastinal contours are within normal limits. Both lungs are clear. Atherosclerosis of thoracic aorta is noted. The visualized skeletal structures are unremarkable. IMPRESSION: No acute cardiopulmonary abnormality seen.  Aortic atherosclerosis. Electronically Signed  By: Marijo Conception, M.D.   On: 10/22/2016 15:12    EKG:  Normal sinus rhythm. Heart rate 52  IMPRESSION AND PLAN:   Meridith Romick  is a 83 y.o. female with a known history of CVA, chronic anemia undergoing IV iron infusion at the cancer center, depression, GERD comes to the emergency room after she was found collapsed down on the floor. Patient's husband who has dementia was attempting to help her get up and he fell on top of a neither were able to get up till son went to check on them and found down on the floor  1. Altered mental status/fall--etiology remains unclear rule out CVA -Admit to medical floor -CT head negative -MRI brain ordered -Telemetry  monitoring -Labs reviewed appears within normal limits -neurology consultation + symptoms do not improve  2. History of stroke -mRI pending -PT consult -Care management consult  3.hyperlipidemia -Continue home meds  4. Hypertension -Continue home meds once patient able to take orally  5. History of chronic anemia -h&H stable -Patient takes IV iron infusion of the cancer Center  6. DVT prophylaxis subcutaneous Lovenox  Above was discussed with patient's granddaughters and son were present in the emergency room     All the records are reviewed and case discussed with ED provider. Management plans discussed with the patient, family and they are in agreement.  CODE STATUS: full  TOTAL TIME TAKING CARE OF THIS PATIENT: 50 minutes.    Anaisabel Pederson M.D on 10/22/2016 at 4:56 PM  Between 7am to 6pm - Pager - [phone]  After 6pm go to [email or web address] - password [email or web address]  [facility]  Office  [phone]  CC: Primary care physician; Einar Pheasant, MD

## 2016-10-22 NOTE — ED Notes (Signed)
MRI tech paged by ED secretary to be made aware of pending MRI, MRI made aware pt will be admitted on floor but per Dr. Posey Pronto MRI needs to be done tonight

## 2016-10-22 NOTE — ED Notes (Addendum)
Pt resting in bed, family at bedside, talking to family, pt alert, eyes open

## 2016-10-22 NOTE — ED Notes (Signed)
Patient transported to CT 

## 2016-10-23 DIAGNOSIS — R410 Disorientation, unspecified: Secondary | ICD-10-CM

## 2016-10-23 DIAGNOSIS — R4182 Altered mental status, unspecified: Secondary | ICD-10-CM | POA: Diagnosis not present

## 2016-10-23 DIAGNOSIS — W19XXXA Unspecified fall, initial encounter: Secondary | ICD-10-CM | POA: Diagnosis not present

## 2016-10-23 DIAGNOSIS — I10 Essential (primary) hypertension: Secondary | ICD-10-CM | POA: Diagnosis not present

## 2016-10-23 DIAGNOSIS — F329 Major depressive disorder, single episode, unspecified: Secondary | ICD-10-CM | POA: Diagnosis not present

## 2016-10-23 MED ORDER — ADULT MULTIVITAMIN W/MINERALS CH
1.0000 | ORAL_TABLET | Freq: Every day | ORAL | Status: DC
  Administered 2016-10-24: 1 via ORAL
  Filled 2016-10-23: qty 1

## 2016-10-23 MED ORDER — ENSURE ENLIVE PO LIQD
237.0000 mL | Freq: Two times a day (BID) | ORAL | Status: DC
  Administered 2016-10-24 (×2): 237 mL via ORAL

## 2016-10-23 NOTE — Consult Note (Signed)
Reason for Consult: AMS  Referring Physician: Dr. Darvin Neighbours  CC: AMS   HPI: Rachel Martinez is an 83 y.o. female with a known history of CVA, chronic anemia undergoing IV iron infusion at the cancer center, depression, GERD comes to the emergency room after she was found collapsed down on the floor. Patient's husband who has dementia was attempting to help her get up and he fell on top of a neither were able to get up till son went to check on them and found down on the floor.  Pt states she remembers the event but just could not get up.   Mentation significantly improved overnight.    Past Medical History:  Diagnosis Date  . Anemia   . Chicken pox   . CVA (cerebral vascular accident) ([place]) 8/06  . Depression   . GERD (gastroesophageal reflux disease)   . Heart palpitations   . Hypercholesteremia   . Hypertension   . Stroke [facility])     Past Surgical History:  Procedure Laterality Date  . APPENDECTOMY      Family History  Problem Relation Age of Onset  . CVA Mother   . Diabetes Father   . Heart disease Father   . Cancer Sister        breast   . Cancer Brother        stomach  . Leukemia Brother   . Lung disease Brother   . Hypertension Sister   . Cancer Sister        stomach  . Mental illness Brother        suicide  . Breast cancer Paternal Aunt     Social History:  reports that she quit smoking about 13 years ago. She has a 7.50 pack-year smoking history. She has never used smokeless tobacco. She reports that she does not drink alcohol or use drugs.  Allergies  Allergen Reactions  . Amoxicillin-Pot Clavulanate     Other reaction(s): Unknown  . Ciprofloxacin     Other reaction(s): Unknown  . Codeine Sulfate   . Imipenem     Other reaction(s): Unknown  . Metronidazole     Other reaction(s): Unknown    Medications:  I have reviewed the patient's current medications. Prior to Admission:  Prescriptions Prior to Admission  Medication Sig Dispense Refill Last Dose   . clopidogrel (PLAVIX) 75 MG tablet take 1 tablet by mouth once daily 30 tablet 5 Past Week at Unknown time  . Fe Fum-FePoly-Vit C-Vit B3 (INTEGRA) 62.5-62.5-40-3 MG CAPS Take 1 capsule by mouth daily.   1 Past Week at Unknown time  . lisinopril (PRINIVIL,ZESTRIL) 10 MG tablet Take 1 tablet (10 mg total) by mouth daily. 30 tablet 5 Past Week at Unknown time  . pravastatin (PRAVACHOL) 40 MG tablet take 1 tablet by mouth once daily 30 tablet 5 Past Week at Unknown time  . sertraline (ZOLOFT) 50 MG tablet take 1 tablet by mouth once daily 30 tablet 2 Past Week at Unknown time  . calcium carbonate (TUMS - DOSED IN MG ELEMENTAL CALCIUM) 500 MG chewable tablet Chew 1 tablet by mouth 3 (three) times daily as needed.    prn at prn  . TAZTIA XT 120 MG 24 hr capsule take 1 capsule by mouth once daily (Patient not taking: Reported on 10/22/2016) 30 capsule 5 Not Taking at Unknown time    ROS: History obtained from the patients son    General ROS: negative for - chills, fatigue, fever, night sweats, weight  gain or weight loss Psychological ROS: negative for - behavioral disorder, hallucinations, memory difficulties, mood swings or suicidal ideation Ophthalmic ROS: negative for - blurry vision, double vision, eye pain or loss of vision ENT ROS: negative for - epistaxis, nasal discharge, oral lesions, sore throat, tinnitus or vertigo Allergy and Immunology ROS: negative for - hives or itchy/watery eyes Hematological and Lymphatic Iron deficiency anemia Endocrine ROS: negative for - galactorrhea, hair pattern changes, polydipsia/polyuria or temperature intolerance Respiratory ROS: negative for - cough, hemoptysis, shortness of breath or wheezing Cardiovascular ROS: asymptomatic bradycardia  Gastrointestinal ROS: negative for - abdominal pain, diarrhea, hematemesis, nausea/vomiting or stool incontinence Genito-Urinary ROS: negative for - dysuria, hematuria, incontinence or urinary  frequency/urgency Musculoskeletal ROS: negative for - joint swelling or muscular weakness Neurological ROS: as noted in HPI Dermatological ROS: negative for rash and skin lesion changes  Physical Examination: Blood pressure (!) 141/58, pulse 64, temperature 98.4 F (36.9 C), temperature source Oral, resp. rate 18, height 5\' 2"  (1.575 m), weight 49.9 kg (110 lb), SpO2 94 %.    Neurological Examination   Mental Status: Alert to name and location  Cranial Nerves: II: Discs flat bilaterally; Visual fields grossly normal, pupils equal, round, reactive to light and accommodation III,IV, VI: ptosis not present, extra-ocular motions intact bilaterally V,VII: smile symmetric, facial light touch sensation normal bilaterally VIII: hearing normal bilaterally IX,X: gag reflex present XI: bilateral shoulder shrug XII: midline tongue extension Motor: Right : Upper extremity   4/5    Left:     Upper extremity   4/5  Lower extremity   4/5     Lower extremity   4/5 Tone and bulk:normal tone throughout; no atrophy noted Sensory: Pinprick and light touch intact throughout, bilaterally Deep Tendon Reflexes: 2+ and symmetric throughout Plantars: Right: downgoing   Left: downgoing Cerebellar: normal finger-to-nose, normal rapid alternating movements and normal heel-to-shin test Gait: not tested     Laboratory Studies:   Basic Metabolic Panel:  Recent Labs Lab 10/22/16 1415  NA 143  K 4.2  CL 109  CO2 27  GLUCOSE 119*  BUN 23*  CREATININE 1.01*  CALCIUM 8.9    Liver Function Tests:  Recent Labs Lab 10/22/16 1415  AST 33  ALT 13*  ALKPHOS 59  BILITOT 0.4  PROT 6.5  ALBUMIN 3.9   No results for input(s): LIPASE, AMYLASE in the last 168 hours. No results for input(s): AMMONIA in the last 168 hours.  CBC:  Recent Labs Lab 10/22/16 1415  WBC 10.8  NEUTROABS 10.2*  HGB 12.6  HCT 37.8  MCV 84.1  PLT 152    Cardiac Enzymes:  Recent Labs Lab 10/22/16 1415  CKTOTAL  217  TROPONINI <0.03    BNP: Invalid input(s): POCBNP  CBG: No results for input(s): GLUCAP in the last 168 hours.  Microbiology: No results found for this or any previous visit.  Coagulation Studies: No results for input(s): LABPROT, INR in the last 72 hours.  Urinalysis:  Recent Labs Lab 10/22/16 1416  COLORURINE YELLOW*  LABSPEC 1.020  PHURINE 5.0  GLUCOSEU NEGATIVE  HGBUR NEGATIVE  BILIRUBINUR NEGATIVE  KETONESUR 5*  PROTEINUR NEGATIVE  NITRITE NEGATIVE  LEUKOCYTESUR SMALL*    Lipid Panel:     Component Value Date/Time   CHOL 164 08/30/2016 1642   TRIG 93.0 08/30/2016 1642   HDL 67.10 08/30/2016 1642   CHOLHDL 2 08/30/2016 1642   VLDL 18.6 08/30/2016 1642   LDLCALC 79 08/30/2016 1642    HgbA1C: No results  found for: HGBA1C  Urine Drug Screen:  No results found for: LABOPIA, COCAINSCRNUR, LABBENZ, AMPHETMU, THCU, LABBARB  Alcohol Level:  Recent Labs Lab 10/22/16 [place] <10    Other results: EKG: normal EKG, normal sinus rhythm, unchanged from previous tracings.  Imaging: Ct Head Wo Contrast  Result Date: 10/22/2016 CLINICAL DATA:  83 year old female with a history of fall EXAM: CT HEAD WITHOUT CONTRAST TECHNIQUE: Contiguous axial images were obtained from the base of the skull through the vertex without intravenous contrast. COMPARISON:  Head CT 08/15/2004, MR 08/16/2004 FINDINGS: Brain: No acute intracranial hemorrhage. No midline shift or mass effect. Calcifications of the bilateral basal ganglia. Confluent hypodensity in the subcortical white matter of the parietal region extending to the margin of the ventricle. There is hypodensity within the white matter of the frontal region, and to a lesser degree within the right frontal region. Gray-white differentiation is maintained. Vascular: Calcifications of the intracranial vasculature. Skull: No fracture.  No aggressive bony lesion. Sinuses/Orbits: Calcified mass within the region the frontal sinus  extending through the expected location of the inferior recess, compatible with paranasal sinus osteoma. Other: None IMPRESSION: No CT evidence of acute intracranial abnormality. Similar appearance of left greater than right chronic white matter disease. Intracranial atherosclerosis. Frontal sinus paranasal sinus osteoma. Electronically Signed   By: Corrie Mckusick D.O.   On: 10/22/2016 14:51   Mr Brain Wo Contrast  Result Date: 10/22/2016 CLINICAL DATA:  Initial evaluation for acute altered mental status, fall. History of CVA. EXAM: MRI HEAD WITHOUT CONTRAST TECHNIQUE: Multiplanar, multiecho pulse sequences of the brain and surrounding structures were obtained without intravenous contrast. COMPARISON:  Prior CT from 10/22/2016. FINDINGS: Brain: Diffuse prominence of the CSF containing spaces compatible with generalized age-related cerebral atrophy. Patchy and confluent T2/FLAIR hyperintensity within the periventricular and deep white matter both cerebral hemispheres most consistent with chronic small vessel ischemic changes, moderate to advanced in nature. No abnormal foci of restricted diffusion to suggest acute or subacute ischemia. Gray-white matter differentiation maintained. No areas of remote cortical infarction. No evidence for acute or chronic intracranial hemorrhage. No mass lesion, midline shift or mass effect. No hydrocephalus. Coaptation of the frontal horn of the right lateral ventricle noted. No extra-axial fluid collection. Major dural sinuses are grossly patent. Pituitary gland within normal limits. Midline structures intact and normal. Vascular: Major intracranial vascular flow voids are maintained. Skull and upper cervical spine: Craniocervical junction normal. Visualized upper cervical spine within normal limits. Bone marrow signal intensity normal. No scalp soft tissue abnormality. Sinuses/Orbits: Globes and orbital soft tissues within normal limits. Patient status post lens extraction  bilaterally. Paranasal sinuses largely clear. No mastoid effusion. Inner ear structures within normal limits. Other: None. IMPRESSION: 1. No acute intracranial abnormality identified. 2. Generalized age related cerebral atrophy with moderate to advanced chronic microvascular ischemic disease. Electronically Signed   By: Jeannine Boga M.D.   On: 10/22/2016 20:59   Dg Chest Port 1 View  Result Date: 10/22/2016 CLINICAL DATA:  Bradycardia. EXAM: PORTABLE CHEST 1 VIEW COMPARISON:  None. FINDINGS: The heart size and mediastinal contours are within normal limits. Both lungs are clear. Atherosclerosis of thoracic aorta is noted. The visualized skeletal structures are unremarkable. IMPRESSION: No acute cardiopulmonary abnormality seen.  Aortic atherosclerosis. Electronically Signed   By: Marijo Conception, M.D.   On: 10/22/2016 15:12     Assessment/Plan: 83 y.o. female with a known history of CVA, chronic anemia undergoing IV iron infusion at the cancer center, depression, GERD  comes to the emergency room after she was found collapsed down on the floor. Patient's husband who has dementia was attempting to help her get up and he fell on top of a neither were able to get up till son went to check on them and found down on the floor.  Pt states she remembers the event but just could not get up.   Mentation significantly improved overnight.    Still slight confusion as per son but much improved No significant abnormalities on laboratory work up to suggest metabolic work up No prior hx of similar events CTH and MRI brian no acute abnormalities Likely d/c planning with EEG as out pt.    Leotis Pain  10/23/2016, 11:31 AM

## 2016-10-23 NOTE — Progress Notes (Signed)
Initial Nutrition Assessment  DOCUMENTATION CODES:   Severe malnutrition in context of chronic illness  INTERVENTION:  Provide Ensure Enlive po BID, each supplement provides 350 kcal and 20 grams of protein. Encouraged patient to continue drinking high-calorie, high-protein oral nutrition supplement BID after discharge.  Provide daily multivitamin with minerals.  Encouraged adequate intake of calories and protein from meals and oral nutrition supplements to prevent further unintentional weight loss, loss of lean body mass, and to help prevent future falls.  NUTRITION DIAGNOSIS:   Malnutrition (Severe) related to chronic illness (advanced age) as evidenced by severe depletion of body fat, severe depletion of muscle mass, 5.8 percent weight loss over 1 month.  GOAL:   Patient will meet greater than or equal to 90% of their needs  MONITOR:   PO intake, Supplement acceptance, Labs, Weight trends, I & O's  REASON FOR ASSESSMENT:   Consult Assessment of nutrition requirement/status  ASSESSMENT:   83 year old female with PMHx of hypercholesterolemia, HTN, hx CVA 2006, depression, GERD who presented with AMS after fall undergoing work-up.    -MRI normal.  Met with patient and her sons at bedside. Patient lives at home with her husband, who she cares for. She reports her appetite is usually pretty good, and she feels it is unchanged from baseline. She typically eats two meals per day. For breakfast she may have cereal with blueberries or banana nut bread. For dinner she has a meat with vegetables. She recently had stew beef with vegetables, broccoli with cheese, and cabbage casserole. Patient denies any difficulty chewing/swallowing, N/V, or abdominal pain.  Patient endorses recent weight loss that was unintentional. She reports her UBW is 130-135 lbs. Do not see a recent weight that high in chart. Per chart she was 125.6 lbs on 08/30/2016 and 118.3 lbs on 09/28/2016. That is a weight loss  of 7.3 lbs (5.8% body weight) over one month, which is significant for time frame. Patient believes current weight in chart of 110 lbs is inaccurate. Unable to verify weight as patient was sitting in chair, not bed. Due to severity of wasting on exam, likely more chronic malnutrition, though weight loss has been more acute.  Medications reviewed and include: Lovenox.  Labs reviewed: Bun 23, Creatinine 1.01.  Nutrition-Focused physical exam completed. Findings are severe fat depletion (severe depletion of orbital region, upper arm region, thoracic/lumbar region), moderate-severe muscle depletion (moderate depletion of posterior calf region; severe depletion of temple region, clavicle bone region, clavicle/acromion bone region, scapular bone region, dorsal hand, patellar region, anterior thigh region), and no edema.   Discussed with RN.  Will use weight of 53.7 kg to estimate needs.  Diet Order:  Diet regular Room service appropriate? Yes; Fluid consistency: Thin  Skin:  Reviewed, no issues  Last BM:  Unknown  Height:   Ht Readings from Last 1 Encounters:  10/22/16 _0  (1.575 m)    Weight:   Wt Readings from Last 1 Encounters:  10/22/16 110 lb (49.9 kg)    Ideal Body Weight:  50 kg  BMI:  Body mass index is 20.12 kg/m.  Estimated Nutritional Needs:   Kcal:  1345-1610 (25-30 kcal/kg)  Protein:  80-90 grams (1.5-1.7 grams/kg)  Fluid:  1.3 L/day (25 ml/kg)  EDUCATION NEEDS:   Education needs addressed  Willey Blade, MS, RD, LDN Pager: [phone] After Hours Pager: [phone]

## 2016-10-23 NOTE — Progress Notes (Signed)
Pt alert no complaints of pain or discomfort.  Bed in low position, call bell within reach.  Bed alarms on and functioning.  Assessment done and charted.  Will continue to monitor and do hourly rounding throughout the shift

## 2016-10-23 NOTE — Progress Notes (Signed)
[place] [facility] [place] NAME: Rachel Martinez    MR#:  [id number]  DATE OF BIRTH:  09-20-1927  SUBJECTIVE:  CHIEF COMPLAINT:   Chief Complaint  Patient presents with  . Fall  . Altered Mental Status   Patient is awake and alert. Today she is oriented to place and person but not time. Son at bedside mentions that she does have some short-term memory problems at times. She is the primary caregiver for her husband with advanced dementia. No recent falls. At baseline she does have some generalized weakness.  REVIEW OF SYSTEMS:    Review of Systems  Unable to perform ROS: Mental status change    DRUG ALLERGIES:   Allergies  Allergen Reactions  . Amoxicillin-Pot Clavulanate     Other reaction(s): Unknown  . Ciprofloxacin     Other reaction(s): Unknown  . Codeine Sulfate   . Imipenem     Other reaction(s): Unknown  . Metronidazole     Other reaction(s): Unknown    VITALS:  Blood pressure (!) 155/50, pulse 60, temperature 98.1 F (36.7 C), temperature source Oral, resp. rate 18, height 5\' 2"  (1.575 m), weight 49.9 kg (110 lb), SpO2 100 %.  PHYSICAL EXAMINATION:   Physical Exam  GENERAL:  83 y.o.-year-old patient lying in the bed with no acute distress.  EYES: Pupils equal, round, reactive to light and accommodation. No scleral icterus. Extraocular muscles intact.  HEENT: Head atraumatic, normocephalic. Oropharynx and nasopharynx clear.  NECK:  Supple, no jugular venous distention. No thyroid enlargement, no tenderness.  LUNGS: Normal breath sounds bilaterally, no wheezing, rales, rhonchi. No use of accessory muscles of respiration.  CARDIOVASCULAR: S1, S2 normal. No murmurs, rubs, or gallops.  ABDOMEN: Soft, nontender, nondistended. Bowel sounds present. No organomegaly or mass.  EXTREMITIES: No cyanosis, clubbing or edema b/l.    NEUROLOGIC: Cranial nerves II through XII are intact. No focal Motor or sensory deficits b/l.    PSYCHIATRIC: The patient is alert and oriented to Place and person but not time SKIN: No obvious rash, lesion, or ulcer.   LABORATORY PANEL:   CBC  Recent Labs Lab 10/22/16 1415  WBC 10.8  HGB 12.6  HCT 37.8  PLT 152   ------------------------------------------------------------------------------------------------------------------ Chemistries   Recent Labs Lab 10/22/16 1415  NA 143  K 4.2  CL 109  CO2 27  GLUCOSE 119*  BUN 23*  CREATININE 1.01*  CALCIUM 8.9  AST 33  ALT 13*  ALKPHOS 59  BILITOT 0.4   ------------------------------------------------------------------------------------------------------------------  Cardiac Enzymes  Recent Labs Lab 10/22/16 1415  TROPONINI <0.03   ------------------------------------------------------------------------------------------------------------------  RADIOLOGY:  Ct Head Wo Contrast  Result Date: 10/22/2016 CLINICAL DATA:  83 year old female with a history of fall EXAM: CT HEAD WITHOUT CONTRAST TECHNIQUE: Contiguous axial images were obtained from the base of the skull through the vertex without intravenous contrast. COMPARISON:  Head CT 08/15/2004, MR 08/16/2004 FINDINGS: Brain: No acute intracranial hemorrhage. No midline shift or mass effect. Calcifications of the bilateral basal ganglia. Confluent hypodensity in the subcortical white matter of the parietal region extending to the margin of the ventricle. There is hypodensity within the white matter of the frontal region, and to a lesser degree within the right frontal region. Gray-white differentiation is maintained. Vascular: Calcifications of the intracranial vasculature. Skull: No fracture.  No aggressive bony lesion. Sinuses/Orbits: Calcified mass within the region the frontal sinus extending through the expected location of the inferior recess, compatible with paranasal sinus osteoma. Other: None  IMPRESSION: No CT evidence of acute intracranial abnormality. Similar  appearance of left greater than right chronic white matter disease. Intracranial atherosclerosis. Frontal sinus paranasal sinus osteoma. Electronically Signed   By: Corrie Mckusick D.O.   On: 10/22/2016 14:51   Mr Brain Wo Contrast  Result Date: 10/22/2016 CLINICAL DATA:  Initial evaluation for acute altered mental status, fall. History of CVA. EXAM: MRI HEAD WITHOUT CONTRAST TECHNIQUE: Multiplanar, multiecho pulse sequences of the brain and surrounding structures were obtained without intravenous contrast. COMPARISON:  Prior CT from 10/22/2016. FINDINGS: Brain: Diffuse prominence of the CSF containing spaces compatible with generalized age-related cerebral atrophy. Patchy and confluent T2/FLAIR hyperintensity within the periventricular and deep white matter both cerebral hemispheres most consistent with chronic small vessel ischemic changes, moderate to advanced in nature. No abnormal foci of restricted diffusion to suggest acute or subacute ischemia. Gray-white matter differentiation maintained. No areas of remote cortical infarction. No evidence for acute or chronic intracranial hemorrhage. No mass lesion, midline shift or mass effect. No hydrocephalus. Coaptation of the frontal horn of the right lateral ventricle noted. No extra-axial fluid collection. Major dural sinuses are grossly patent. Pituitary gland within normal limits. Midline structures intact and normal. Vascular: Major intracranial vascular flow voids are maintained. Skull and upper cervical spine: Craniocervical junction normal. Visualized upper cervical spine within normal limits. Bone marrow signal intensity normal. No scalp soft tissue abnormality. Sinuses/Orbits: Globes and orbital soft tissues within normal limits. Patient status post lens extraction bilaterally. Paranasal sinuses largely clear. No mastoid effusion. Inner ear structures within normal limits. Other: None. IMPRESSION: 1. No acute intracranial abnormality identified. 2.  Generalized age related cerebral atrophy with moderate to advanced chronic microvascular ischemic disease. Electronically Signed   By: Jeannine Boga M.D.   On: 10/22/2016 20:59   Dg Chest Port 1 View  Result Date: 10/22/2016 CLINICAL DATA:  Bradycardia. EXAM: PORTABLE CHEST 1 VIEW COMPARISON:  None. FINDINGS: The heart size and mediastinal contours are within normal limits. Both lungs are clear. Atherosclerosis of thoracic aorta is noted. The visualized skeletal structures are unremarkable. IMPRESSION: No acute cardiopulmonary abnormality seen.  Aortic atherosclerosis. Electronically Signed   By: Marijo Conception, M.D.   On: 10/22/2016 15:12     ASSESSMENT AND PLAN:   Rachel Martinez  is a 83 y.o. female with a known history of CVA, chronic anemia undergoing IV iron infusion at the cancer center, depression, GERD comes to the emergency room after she was found collapsed down on the floor. Patient's husband who has dementia was attempting to help her get up and he fell on top of a neither were able to get up till son went to check on them and found down on the floor  * Altered mental status/fall--etiology remains unclear  Syncope vs seizure MRI normal Check Echo and EEG Discussed with Dr. Irish Elders Improving  * History of stroke -MRI - Nothing acute -PT consult - SNF  * hyperlipidemia -Continue home meds  * Hypertension -Continue home meds once patient able to take orally  * History of chronic anemia - H&H stable -Patient gets IV iron infusion of the cancer Center  All the records are reviewed and case discussed with Care Management/Social Workerr. Management plans discussed with the patient, family and they are in agreement.  CODE STATUS: FULL CODE  DVT Prophylaxis: SCDs  TOTAL TIME TAKING CARE OF THIS PATIENT: 30 minutes.   POSSIBLE D/C IN 1-2 DAYS, DEPENDING ON CLINICAL CONDITION.  Hillary Bow R M.D on 10/23/2016 at  1:34 PM  Between 7am to 6pm - Pager -  [phone]  After 6pm go to [email or web address] - password [email or web address] [facility]  Office  [phone]  CC: Primary care physician; Einar Pheasant, MD  Note: This dictation was prepared with Dragon dictation along with smaller phrase technology. Any transcriptional errors that result from this process are unintentional.

## 2016-10-23 NOTE — Clinical Social Work Note (Signed)
Clinical Social Work Assessment  Patient Details  Name: Rachel Martinez MRN: [id number] Date of Birth: [birth year]  Date of referral:  10/23/16               Reason for consult:  Facility Placement                Permission sought to share information with:    Permission granted to share information::     Name::        Agency::     Relationship::     Contact Information:     Housing/Transportation Living arrangements for the past 2 months:  Single Family Home Source of Information:  Patient, Medical Team, Adult Children Patient Interpreter Needed:  None Criminal Activity/Legal Involvement Pertinent to Current Situation/Hospitalization:  No - Comment as needed Significant Relationships:  Adult Children, Church, [employer], Spouse, Other Family Members Lives with:  Spouse Do you feel safe going back to the place where you live?  Yes Need for family participation in patient care:  No (Coment)  Care giving concerns:  PT recommendation for STR; Medicare will not cover due to observation status   Social Worker assessment / plan:  The CSW met with the patient at bedside to discuss discharge planning and the barriers to insurance coverage. Included were the patient's son and granddaughter. The CSW addressed the barrier of observation status; the family understood, and they recognize that the patient would require out of pocket services at that time. The CSW provided a list of area SNFs and ALFs for possible respite care. The main barrier is that the patient is the primary caregiver for her spouse who has dementia and cannot return home alone. He is also in the hospital under observation status due to a fall when attempting to help his wife after her fall. The patient's son reported that his 3 retired brothers would be taking turns assisting their parents during the day, and they are considering hiring a private person to assist in the evening until the patient is better able to care for her  spouse.  The patient will most likely discharge tomorrow. The patient's spouse's attending MD and the patient's MD are aware that the patient's spouse would be unsafe at the home even with support from the family as he becomes agitated if his wife is not there. The plan is for the patient and her spouse to discharge at the same time when they are both stable.  Employment status:  Retired Forensic scientist:  [employer] PT Recommendations:  [place] / Referral to community resources:  [place], Other (Comment Required) (ALF)  Patient/Family's Response to care:  The family thanked the CSW for assistance and information.  Patient/Family's Understanding of and Emotional Response to Diagnosis, Current Treatment, and Prognosis:  The family plans to [facility] with [facility] to address home health needs and to gain a list of private agencies to assist with evenings.  Emotional Assessment Appearance:  Appears stated age Attitude/Demeanor/Rapport:   (Pleasant and alert) Affect (typically observed):  Accepting, Appropriate, Pleasant Orientation:  Oriented to Self, Oriented to Place, Oriented to  Time, Oriented to Situation Alcohol / Substance use:  Never Used Psych involvement (Current and /or in the community):  No (Comment)  Discharge Needs  Concerns to be addressed:  Care Coordination, Discharge Planning Concerns, Financial / Insurance Concerns Readmission within the last 30 days:  No Current discharge risk:  Other (Patient is primary caregiver for her spouse; he is  also admitted due to a fall and cannot assist her.) Barriers to Discharge:  Unsafe home situation, Continued Medical Work up   [employer], [place] 10/23/2016, 1:26 PM

## 2016-10-23 NOTE — Care Management Note (Signed)
Case Management Note  Patient Details  Name: Rachel Martinez MRN: [id number] Date of Birth: 20-Sep-1927  Subjective/Objective:     Reviewed MOON form with son Chrissie Noa at bedside who still requested to speak with someone about placement for one or both of his parents who are both currently OBS patients at [facility] Weekend CSW was updated.                Action/Plan:   Expected Discharge Date:  10/26/16               Expected Discharge Plan:     In-House Referral:     Discharge planning Services     Post Acute Care Choice:    Choice offered to:     DME Arranged:    DME Agency:     HH Arranged:    HH Agency:     Status of Service:     If discussed at [employer], dates discussed:    Additional Comments:  Bela Bonaparte A, RN 10/23/2016, 9:17 AM

## 2016-10-23 NOTE — Care Management Obs Status (Signed)
[place] NOTIFICATION   Patient Details  Name: POETRY CERRO MRN: [id number] Date of Birth: August 30, 1927   Medicare Observation Status Notification Given:  Yes    Minta Fair A, RN 10/23/2016, 8:32 AM

## 2016-10-23 NOTE — Progress Notes (Signed)
Physical Therapy Evaluation Patient Details Name: Rachel Martinez MRN: [id number] DOB: Jan 06, 1928 Today's Date: 10/23/2016   History of Present Illness  Patient is an 83 y.o. female admitted on 60 SEP for altered mental status s/p fall. Patient's husband allegedly tried to get patient standing and fell on top of her. Son found patient and patient's husband on floor and brought to hospital. PMH includes CVA, anemia, depression, and GERD.  Clinical Impression  Patient oriented x2 at time of evaluation, demonstrating decreased orientation to time and short term recall. Patient able to follow commands, showing 4/5 strength globally in UE/LEs. Patient moved from supine to sit with use of bed rails but required minimal assistance to stand and ambulate. Demonstrated sporadic knee buckling; PT educated patient about confidence in strength and explained how fear of falling can increase fall risk. Because no family was present, the subjective information cannot be verified; however, patient is deemed not to be at baseline level of function. Due to her insecurity in ambulation, she is at a higher risk for falls and recurrent injuries, so it is believed that she will continue to benefit from skilled and progressive PT in the hospital and at d/c to prevent recurrent admissions.    Follow Up Recommendations SNF    Equipment Recommendations       Recommendations for Other Services       Precautions / Restrictions Precautions Precautions: Fall Restrictions Weight Bearing Restrictions: No      Mobility  Bed Mobility Overal bed mobility: Modified Independent             General bed mobility comments: Patient moves from supine to sit with use of bed rails.  Transfers Overall transfer level: Needs assistance Equipment used: Rolling walker (2 wheeled) Transfers: Sit to/from Stand Sit to Stand: Min assist         General transfer comment: Patient moves from sit to stand with minimal  assistance and stand to sit with CGA.  Ambulation/Gait Ambulation/Gait assistance: Min assist Ambulation Distance (Feet): 12 Feet Assistive device: Rolling walker (2 wheeled)       General Gait Details: Patient initially began ambulating, demonstrating slight bilateral knee buckling. Returned to sitting and encouraged confidence in strength. Patient demonstrated dynamic stability weightshifting and marching. Able to ambulate to bedside chair at decreased cadence with verbal cues and minimal assistance to guide RW.  Stairs            Wheelchair Mobility    Modified Rankin (Stroke Patients Only)       Balance Overall balance assessment: History of Falls;Needs assistance Sitting-balance support: Bilateral upper extremity supported Sitting balance-Leahy Scale: Good     Standing balance support: Bilateral upper extremity supported Standing balance-Leahy Scale: Fair                               Pertinent Vitals/Pain Pain Assessment: Faces Faces Pain Scale: Hurts a little bit Pain Location: R knee Pain Descriptors / Indicators: Aching Pain Intervention(s): Limited activity within patient's tolerance;Monitored during session    [place] expects to be discharged to:: Private residence Living Arrangements: Spouse/significant other Available Help at Discharge: Family;Available PRN/intermittently Type of Home: House       Home Layout: One level Home Equipment: Carpenter - 2 wheels;Wheelchair - manual      Prior Function Level of Independence: Needs assistance   Gait / Transfers Assistance Needed: Performed household distances with RW  ADL's / Homemaking  Assistance Needed: Performed by son; patient cannot drive        Hand Dominance        Extremity/Trunk Assessment   Upper Extremity Assessment Upper Extremity Assessment: Generalized weakness    Lower Extremity Assessment Lower Extremity Assessment: Generalized weakness        Communication   Communication: No difficulties  Cognition Arousal/Alertness: Awake/alert Behavior During Therapy: Flat affect Overall Cognitive Status: No family/caregiver present to determine baseline cognitive functioning                                 General Comments: Patient oriented x3 but demonstrated decreased orientation to time and short term recall      General Comments      Exercises     Assessment/Plan    PT Assessment Patient needs continued PT services  PT Problem List Decreased strength;Decreased activity tolerance;Decreased balance;Decreased mobility;Decreased cognition;Decreased knowledge of use of DME;Decreased safety awareness       PT Treatment Interventions DME instruction;Gait training;Stair training;Functional mobility training;Therapeutic activities;Therapeutic exercise;Balance training;Patient/family education    PT Goals (Current goals can be found in the Care Plan section)  Acute Rehab PT Goals Patient Stated Goal: "To get stronger" PT Goal Formulation: Patient unable to participate in goal setting Time For Goal Achievement: 11/06/16 Potential to Achieve Goals: Good    Frequency Min 2X/week   Barriers to discharge Inaccessible home environment;Decreased caregiver support      Co-evaluation               AM-PAC PT "6 Clicks" Daily Activity  Outcome Measure Difficulty turning over in bed (including adjusting bedclothes, sheets and blankets)?: A Little Difficulty moving from lying on back to sitting on the side of the bed? : A Little Difficulty sitting down on and standing up from a chair with arms (e.g., wheelchair, bedside commode, etc,.)?: Unable Help needed moving to and from a bed to chair (including a wheelchair)?: A Little Help needed walking in hospital room?: A Little Help needed climbing 3-5 steps with a railing? : A Lot 6 Click Score: 15    End of Session Equipment Utilized During Treatment: Gait  belt Activity Tolerance: Patient tolerated treatment well Patient left: in chair;with call bell/phone within reach;with chair alarm set   PT Visit Diagnosis: Unsteadiness on feet (R26.81);History of falling (Z91.81);Difficulty in walking, not elsewhere classified (R26.2);Muscle weakness (generalized) (M62.81)    Time: 5176-1607 PT Time Calculation (min) (ACUTE ONLY): 11 min   Charges:   PT Evaluation $PT Eval Low Complexity: 1 Low     PT G Codes:   PT G-Codes **NOT FOR INPATIENT CLASS** Functional Assessment Tool Used: AM-PAC 6 Clicks Basic Mobility;Clinical judgement Functional Limitation: Mobility: Walking and moving around Mobility: Walking and Moving Around Current Status (P7106): At least 40 percent but less than 60 percent impaired, limited or restricted Mobility: Walking and Moving Around Goal Status [phone]): At least 20 percent but less than 40 percent impaired, limited or restricted      Dorice Lamas, PT, DPT 10/23/2016, 10:07 AM

## 2016-10-24 ENCOUNTER — Observation Stay: Payer: Medicare Other

## 2016-10-24 ENCOUNTER — Observation Stay (HOSPITAL_BASED_OUTPATIENT_CLINIC_OR_DEPARTMENT_OTHER)
Admit: 2016-10-24 | Discharge: 2016-10-24 | Disposition: A | Discharge status: Home / Self Care | Payer: Medicare Other | Attending: Internal Medicine | Admitting: Internal Medicine

## 2016-10-24 ENCOUNTER — Telehealth: Payer: Self-pay | Admitting: *Deleted

## 2016-10-24 DIAGNOSIS — R4182 Altered mental status, unspecified: Secondary | ICD-10-CM | POA: Diagnosis not present

## 2016-10-24 DIAGNOSIS — I10 Essential (primary) hypertension: Secondary | ICD-10-CM | POA: Diagnosis not present

## 2016-10-24 DIAGNOSIS — I340 Nonrheumatic mitral (valve) insufficiency: Secondary | ICD-10-CM | POA: Diagnosis not present

## 2016-10-24 DIAGNOSIS — D649 Anemia, unspecified: Secondary | ICD-10-CM | POA: Diagnosis not present

## 2016-10-24 DIAGNOSIS — I361 Nonrheumatic tricuspid (valve) insufficiency: Secondary | ICD-10-CM | POA: Diagnosis not present

## 2016-10-24 DIAGNOSIS — F329 Major depressive disorder, single episode, unspecified: Secondary | ICD-10-CM | POA: Diagnosis not present

## 2016-10-24 DIAGNOSIS — W19XXXA Unspecified fall, initial encounter: Secondary | ICD-10-CM | POA: Diagnosis not present

## 2016-10-24 LAB — ECHOCARDIOGRAM COMPLETE
Height: 62 in
Weight: 1760 oz

## 2016-10-24 MED ORDER — CLOPIDOGREL BISULFATE 75 MG PO TABS
75.0000 mg | ORAL_TABLET | Freq: Every day | ORAL | Status: DC
  Administered 2016-10-24: 75 mg via ORAL
  Filled 2016-10-24: qty 1

## 2016-10-24 MED ORDER — SERTRALINE HCL 50 MG PO TABS
50.0000 mg | ORAL_TABLET | Freq: Every day | ORAL | Status: DC
  Administered 2016-10-24: 50 mg via ORAL
  Filled 2016-10-24: qty 1

## 2016-10-24 MED ORDER — CALCIUM CARBONATE ANTACID 500 MG PO CHEW: 1.0000 | CHEWABLE_TABLET | Freq: Three times a day (TID) | ORAL | Status: DC | PRN

## 2016-10-24 MED ORDER — ENSURE ENLIVE PO LIQD: 237.0000 mL | Freq: Two times a day (BID) | ORAL | 12 refills | Status: DC

## 2016-10-24 MED ORDER — PRAVASTATIN SODIUM 40 MG PO TABS
40.0000 mg | ORAL_TABLET | Freq: Every day | ORAL | Status: DC
  Administered 2016-10-24: 40 mg via ORAL
  Filled 2016-10-24: qty 1

## 2016-10-24 MED ORDER — INTEGRA 62.5-62.5-40-3 MG PO CAPS: 1.0000 | ORAL_CAPSULE | Freq: Every day | ORAL | Status: DC

## 2016-10-24 MED ORDER — LISINOPRIL 10 MG PO TABS
10.0000 mg | ORAL_TABLET | Freq: Every day | ORAL | Status: DC
  Administered 2016-10-24: 10 mg via ORAL
  Filled 2016-10-24: qty 1

## 2016-10-24 NOTE — Clinical Social Work Note (Signed)
CSW received referral for SNF.  Case discussed with case manager and plan is to discharge home with home health.  CSW to sign off please re-consult if social work needs arise.  Adrijana Haros R. Maye Parkinson, MSW, LCSWA [phone]  

## 2016-10-24 NOTE — Discharge Summary (Addendum)
[facility] NAME: Rachel Martinez    MR#:  [id number]  DATE OF BIRTH:  1927/07/01  DATE OF ADMISSION:  10/22/2016 ADMITTING PHYSICIAN: Fritzi Mandes, MD  DATE OF DISCHARGE: 10/24/2016  PRIMARY CARE PHYSICIAN: Einar Pheasant, MD    ADMISSION DIAGNOSIS:  Altered mental status, unspecified altered mental status type [R41.82]  DISCHARGE DIAGNOSIS:  AMS s/p fall w/o injury---resolved  SECONDARY DIAGNOSIS:   Past Medical History:  Diagnosis Date  . Anemia   . Chicken pox   . CVA (cerebral vascular accident) ([place]) 8/06  . Depression   . GERD (gastroesophageal reflux disease)   . Heart palpitations   . Hypercholesteremia   . Hypertension   . Stroke [facility])     HOSPITAL COURSE:   Rachel Martinez a 83 y.o. femalewith a known history of CVA, chronic anemia undergoing IV iron infusion at the cancer center, depression, GERD comes to the emergency room after she was found collapsed down on the floor. Patient's husband who has dementia was attempting to help her get up and he fell on top of a neither were able to get up till son went to check on them and found down on the floor  * Altered mental status/fall--etiology remains unclear  Syncope vs seizure MRI normal Check Echo--done results pending and EEG done --results pending Discussed with Dr. Irish Elders Improving---appears at baseline. Some weakness  * History of stroke -MRI - Nothing acute -PT consult - SNF---under observation. Will arrange HHPT -Son says family will take turns to help  * hyperlipidemia -Continue home meds  * Hypertension -Continue home meds --lisinopril  * History of chronic anemia - H&H stable -Patient gets IV iron infusion of the cancer Center--completed course of IV infusion  *Severe malnutrition in context of chronic illness -seen by dietitian. Cont ensure and MVI  D/c home with HHPT  CONSULTS OBTAINED:  Treatment Team:  Fritzi Mandes, MD Catarina Hartshorn, MD Leotis Pain, MD  DRUG ALLERGIES:   Allergies  Allergen Reactions  . Amoxicillin-Pot Clavulanate     Other reaction(s): Unknown  . Ciprofloxacin     Other reaction(s): Unknown  . Codeine Sulfate   . Imipenem     Other reaction(s): Unknown  . Metronidazole     Other reaction(s): Unknown    DISCHARGE MEDICATIONS:   Current Discharge Medication List    START taking these medications   Details  feeding supplement, ENSURE ENLIVE, (ENSURE ENLIVE) LIQD Take 237 mLs by mouth 2 (two) times daily between meals. Qty: 237 mL, Refills: 12      CONTINUE these medications which have NOT CHANGED   Details  clopidogrel (PLAVIX) 75 MG tablet take 1 tablet by mouth once daily Qty: 30 tablet, Refills: 5    Fe Fum-FePoly-Vit C-Vit B3 (INTEGRA) 62.5-62.5-40-3 MG CAPS Take 1 capsule by mouth daily.  Refills: 1    lisinopril (PRINIVIL,ZESTRIL) 10 MG tablet Take 1 tablet (10 mg total) by mouth daily. Qty: 30 tablet, Refills: 5    pravastatin (PRAVACHOL) 40 MG tablet take 1 tablet by mouth once daily Qty: 30 tablet, Refills: 5    sertraline (ZOLOFT) 50 MG tablet take 1 tablet by mouth once daily Qty: 30 tablet, Refills: 2    calcium carbonate (TUMS - DOSED IN MG ELEMENTAL CALCIUM) 500 MG chewable tablet Chew 1 tablet by mouth 3 (three) times daily as needed.         If you experience worsening of your admission symptoms, develop  shortness of breath, life threatening emergency, suicidal or homicidal thoughts you must seek medical attention immediately by calling 911 or calling your MD immediately  if symptoms less severe.  You Must read complete instructions/literature along with all the possible adverse reactions/side effects for all the Medicines you take and that have been prescribed to you. Take any new Medicines after you have completely understood and accept all the possible adverse reactions/side effects.   Please note  You were cared for by a  hospitalist during your hospital stay. If you have any questions about your discharge medications or the care you received while you were in the hospital after you are discharged, you can call the unit and asked to speak with the hospitalist on call if the hospitalist that took care of you is not available. Once you are discharged, your primary care physician will handle any further medical issues. Please note that NO REFILLS for any discharge medications will be authorized once you are discharged, as it is imperative that you return to your primary care physician (or establish a relationship with a primary care physician if you do not have one) for your aftercare needs so that they can reassess your need for medications and monitor your lab values. Today   SUBJECTIVE   Doing well weak  VITAL SIGNS:  Blood pressure (!) 159/54, pulse 66, temperature 98.4 F (36.9 C), temperature source Oral, resp. rate 19, height 5\' 2"  (1.575 m), weight 49.9 kg (110 lb), SpO2 95 %.  I/O:   Intake/Output Summary (Last 24 hours) at 10/24/16 0935 Last data filed at 10/24/16 0900  Gross per 24 hour  Intake              477 ml  Output             1251 ml  Net             -774 ml    PHYSICAL EXAMINATION:  GENERAL:  83 y.o.-year-old patient lying in the bed with no acute distress.  EYES: Pupils equal, round, reactive to light and accommodation. No scleral icterus. Extraocular muscles intact.  HEENT: Head atraumatic, normocephalic. Oropharynx and nasopharynx clear.  NECK:  Supple, no jugular venous distention. No thyroid enlargement, no tenderness.  LUNGS: Normal breath sounds bilaterally, no wheezing, rales,rhonchi or crepitation. No use of accessory muscles of respiration.  CARDIOVASCULAR: S1, S2 normal. No murmurs, rubs, or gallops.  ABDOMEN: Soft, non-tender, non-distended. Bowel sounds present. No organomegaly or mass.  EXTREMITIES: No pedal edema, cyanosis, or clubbing.  NEUROLOGIC: Cranial nerves II  through XII are intact. Muscle strength 5/5 in all extremities. Sensation intact. Gait not checked.  PSYCHIATRIC: The patient is alert and oriented x 3.  SKIN: No obvious rash, lesion, or ulcer.   DATA REVIEW:   CBC   Recent Labs Lab 10/22/16 1415  WBC 10.8  HGB 12.6  HCT 37.8  PLT 152    Chemistries   Recent Labs Lab 10/22/16 1415  NA 143  K 4.2  CL 109  CO2 27  GLUCOSE 119*  BUN 23*  CREATININE 1.01*  CALCIUM 8.9  AST 33  ALT 13*  ALKPHOS 59  BILITOT 0.4    Microbiology Results   No results found for this or any previous visit (from the past 240 hour(s)).  RADIOLOGY:  Ct Head Wo Contrast  Result Date: 10/22/2016 CLINICAL DATA:  83 year old female with a history of fall EXAM: CT HEAD WITHOUT CONTRAST TECHNIQUE: Contiguous axial images were obtained from  the base of the skull through the vertex without intravenous contrast. COMPARISON:  Head CT 08/15/2004, MR 08/16/2004 FINDINGS: Brain: No acute intracranial hemorrhage. No midline shift or mass effect. Calcifications of the bilateral basal ganglia. Confluent hypodensity in the subcortical white matter of the parietal region extending to the margin of the ventricle. There is hypodensity within the white matter of the frontal region, and to a lesser degree within the right frontal region. Gray-white differentiation is maintained. Vascular: Calcifications of the intracranial vasculature. Skull: No fracture.  No aggressive bony lesion. Sinuses/Orbits: Calcified mass within the region the frontal sinus extending through the expected location of the inferior recess, compatible with paranasal sinus osteoma. Other: None IMPRESSION: No CT evidence of acute intracranial abnormality. Similar appearance of left greater than right chronic white matter disease. Intracranial atherosclerosis. Frontal sinus paranasal sinus osteoma. Electronically Signed   By: Corrie Mckusick D.O.   On: 10/22/2016 14:51   Mr Brain Wo Contrast  Result Date:  10/22/2016 CLINICAL DATA:  Initial evaluation for acute altered mental status, fall. History of CVA. EXAM: MRI HEAD WITHOUT CONTRAST TECHNIQUE: Multiplanar, multiecho pulse sequences of the brain and surrounding structures were obtained without intravenous contrast. COMPARISON:  Prior CT from 10/22/2016. FINDINGS: Brain: Diffuse prominence of the CSF containing spaces compatible with generalized age-related cerebral atrophy. Patchy and confluent T2/FLAIR hyperintensity within the periventricular and deep white matter both cerebral hemispheres most consistent with chronic small vessel ischemic changes, moderate to advanced in nature. No abnormal foci of restricted diffusion to suggest acute or subacute ischemia. Gray-white matter differentiation maintained. No areas of remote cortical infarction. No evidence for acute or chronic intracranial hemorrhage. No mass lesion, midline shift or mass effect. No hydrocephalus. Coaptation of the frontal horn of the right lateral ventricle noted. No extra-axial fluid collection. Major dural sinuses are grossly patent. Pituitary gland within normal limits. Midline structures intact and normal. Vascular: Major intracranial vascular flow voids are maintained. Skull and upper cervical spine: Craniocervical junction normal. Visualized upper cervical spine within normal limits. Bone marrow signal intensity normal. No scalp soft tissue abnormality. Sinuses/Orbits: Globes and orbital soft tissues within normal limits. Patient status post lens extraction bilaterally. Paranasal sinuses largely clear. No mastoid effusion. Inner ear structures within normal limits. Other: None. IMPRESSION: 1. No acute intracranial abnormality identified. 2. Generalized age related cerebral atrophy with moderate to advanced chronic microvascular ischemic disease. Electronically Signed   By: Jeannine Boga M.D.   On: 10/22/2016 20:59   Dg Chest Port 1 View  Result Date: 10/22/2016 CLINICAL DATA:   Bradycardia. EXAM: PORTABLE CHEST 1 VIEW COMPARISON:  None. FINDINGS: The heart size and mediastinal contours are within normal limits. Both lungs are clear. Atherosclerosis of thoracic aorta is noted. The visualized skeletal structures are unremarkable. IMPRESSION: No acute cardiopulmonary abnormality seen.  Aortic atherosclerosis. Electronically Signed   By: Marijo Conception, M.D.   On: 10/22/2016 15:12     Management plans discussed with the patient, family and they are in agreement.  CODE STATUS:     Code Status Orders        Start     Ordered   10/22/16 1652  Full code  Continuous     10/22/16 1655    Code Status History    Date Active Date Inactive Code Status Order ID Comments User Context   This patient has a current code status but no historical code status.      TOTAL TIME TAKING CARE OF THIS PATIENT: *40* minutes.  Shaye Elling M.D on 10/24/2016 at 9:35 AM  Between 7am to 6pm - Pager - [phone] After 6pm go to [email or web address] - password [facility]  Office  [phone]  CC: Primary care physician; Einar Pheasant, MD

## 2016-10-24 NOTE — Care Management (Signed)
Patient lives at home with spouse.  She fell in the bathroom and then her spouse fell trying to help her up and both laid in the floor for unknown amount of hours.  Patient able to perform her adls independently.  Cooks meals.  Current with PCP and recently finished a round of iron infusions.Family checks in on family daily or every other day.  They have life alert but the couple does not wear the necklace. Son Ronalee Belts has [place]. Family assists with errands and taking patient to buy groceries.  Agency preference for home with is [facility].  Obtained order for SN PT Aide SW- for community resources to assist patient and husband to remain in their home.  Provided list of in home caregiver resources.  [facility] accepted referral and requested SOC within 24 hours of discharge.

## 2016-10-24 NOTE — Progress Notes (Signed)
Discharge instructions given to family and pt. At bedside with teach back. Care management at bedside. PT came to evaluate

## 2016-10-24 NOTE — Telephone Encounter (Signed)
Call for TCM tomorrow

## 2016-10-24 NOTE — Progress Notes (Signed)
*  PRELIMINARY RESULTS* Echocardiogram 2D Echocardiogram has been performed.  Sherrie Sport 10/24/2016, 9:08 AM

## 2016-10-24 NOTE — Progress Notes (Signed)
Physical Therapy Treatment Patient Details Name: Rachel Martinez MRN: [id number] DOB: May 18, 1927 Today's Date: 10/24/2016    History of Present Illness Patient is an 83 y.o. female admitted on 68 SEP for altered mental status s/p fall. Patient's husband allegedly tried to get patient standing and fell on top of her. Son found patient and patient's husband on floor and brought to hospital. PMH includes CVA, anemia, depression, and GERD.    PT Comments    Rachel Martinez demonstrated improvement in mobility status.  She currently requires min guard assist for transfers and ambulation and pt was able to ambulate a cumulative 90 ft.  Son and daughter in law present and educated on how to properly provide min guard assist with transfers and ambulation.  Pt instructed to use RW at d/c.  Updated recommendations to home with HHPT and supervision for mobility/OOB.      Follow Up Recommendations  Home health PT;Supervision for mobility/OOB     Equipment Recommendations  Rolling walker with 5" wheels    Recommendations for Other Services       Precautions / Restrictions Precautions Precautions: Fall Restrictions Weight Bearing Restrictions: No    Mobility  Bed Mobility Overal bed mobility: Modified Independent             General bed mobility comments: Patient moves from supine to sit with slightly increased time.   Transfers Overall transfer level: Needs assistance Equipment used: Rolling walker (2 wheeled) Transfers: Sit to/from Stand Sit to Stand: Min guard         General transfer comment: Min guard for safety and cues for proper hand placement and technique.  Pt stood from bed x3 and from [facility] x1.  Ambulation/Gait Ambulation/Gait assistance: Min guard Ambulation Distance (Feet): 90 Feet (60, 20, 10) Assistive device: Rolling walker (2 wheeled) Gait Pattern/deviations: Step-through pattern;Decreased stride length;Trunk flexed Gait velocity: mildly decreased Gait velocity  interpretation: Below normal speed for age/gender General Gait Details: Midly flexed trunk.  Two instances of partial L knee buckle while ambulating to the bathroom but pt able to remain steady without outside physical assist.  Pt ambulated a total of 90 ft.  She ambulated 60 ft into hallway, sat EOB, and then decided she needed to void, ambulated 20 ft to the bathroom and back, sat EOB, and then decided she wanted to sit in the chair and ambulated 10 ft to the chair.     Stairs            Wheelchair Mobility    Modified Rankin (Stroke Patients Only)       Balance Overall balance assessment: History of Falls;Needs assistance Sitting-balance support: No upper extremity supported;Feet supported Sitting balance-Leahy Scale: Good     Standing balance support: Bilateral upper extremity supported Standing balance-Leahy Scale: Fair Standing balance comment: Pt able to stand statically without UE support but relies on UE support for dynamic activties                            Cognition Arousal/Alertness: Awake/alert Behavior During Therapy: Flat affect Overall Cognitive Status: History of cognitive impairments - at baseline                                 General Comments: Per family, pt demonstrates mild memory deficits on occasion      Exercises General Exercises - Lower Extremity Hip Flexion/Marching: Both;AROM;Seated;10 reps  General Comments General comments (skin integrity, edema, etc.): Son and daughter in law present during treatment session.  They were educated in proper technique to provide min guard assist for pt with transfers, ambulation.  Pt was instructed to use RW at d/c and encouraged to use her life alert button which she typically only wears when she ambulates to the mailbox.  Pt verbalized understanding.  Encouraged pt to ambulate in home at least every 45 minutes for improved strength and endurance.       Pertinent Vitals/Pain  Pain Assessment: Faces Faces Pain Scale: Hurts little more Pain Location: Bil knees (visible small bruises on Bil knees) Pain Descriptors / Indicators: Aching Pain Intervention(s): Limited activity within patient's tolerance;Monitored during session    Home Living                      Prior Function            PT Goals (current goals can now be found in the care plan section) Acute Rehab PT Goals Patient Stated Goal: "To get stronger" PT Goal Formulation: Patient unable to participate in goal setting Time For Goal Achievement: 11/06/16 Potential to Achieve Goals: Good Progress towards PT goals: Progressing toward goals    Frequency    Min 2X/week      PT Plan Discharge plan needs to be updated    Co-evaluation              AM-PAC PT "6 Clicks" Daily Activity  Outcome Measure  Difficulty turning over in bed (including adjusting bedclothes, sheets and blankets)?: None Difficulty moving from lying on back to sitting on the side of the bed? : A Little Difficulty sitting down on and standing up from a chair with arms (e.g., wheelchair, bedside commode, etc,.)?: A Lot Help needed moving to and from a bed to chair (including a wheelchair)?: A Little Help needed walking in hospital room?: A Little Help needed climbing 3-5 steps with a railing? : A Little 6 Click Score: 18    End of Session Equipment Utilized During Treatment: Gait belt Activity Tolerance: Patient tolerated treatment well Patient left: in chair;with call bell/phone within reach (no chair alarm set, family to notify RN if they leave room) Nurse Communication: Mobility status PT Visit Diagnosis: Unsteadiness on feet (R26.81);History of falling (Z91.81);Difficulty in walking, not elsewhere classified (R26.2);Muscle weakness (generalized) (M62.81)     Time: 2440-1027 PT Time Calculation (min) (ACUTE ONLY): 23 min  Charges:  $Gait Training: 8-22 mins $Therapeutic Activity: 8-22 mins                     G Codes:       Rachel Martinez PT, DPT 10/24/2016, 2:01 PM

## 2016-10-24 NOTE — Progress Notes (Signed)
PT Cancellation Note  Patient Details Name: Rachel Martinez MRN: [id number] DOB: 1927-05-16   Cancelled Treatment:    Reason Eval/Treat Not Completed: Other (comment). Treatment attempted, however pt out of room for imaging. Will re-attempt   Mariea Mcmartin 10/24/2016, 11:10 AM  Greggory Stallion, PT, DPT [phone]

## 2016-10-24 NOTE — Telephone Encounter (Signed)
Pt will discharge from [facility] on 10/24/16. Pt will need a scheduled HFU with Dr. Nicki Reaper  Pt contact  [phone]

## 2016-10-25 ENCOUNTER — Telehealth: Payer: Self-pay | Admitting: *Deleted

## 2016-10-25 NOTE — Telephone Encounter (Signed)
I will make sure there is staff. No problem. I am not aware of a mtg as of today.  thanks

## 2016-10-25 NOTE — Telephone Encounter (Signed)
Spoke with Son, TCM completed  Transition Care Management Follow-up Telephone Call How have you been since you were released from the hospital? She has been doing good, getting around with a walker.    Do you understand why you were in the hospital? yes   Do you understand the discharge instrcutions? Yes, no concerns  Items Reviewed:  Medications reviewed: yes, no issues  Allergies reviewed: yes, no changes  Dietary changes reviewed: added ensure, not issues with this   Referrals reviewed: no referrals, follow up with PCP in 1 week   Functional Questionnaire:  Activities of Daily Living (ADLs):   Patient is using walker to get around.  Has a home health nurse coming today at noon to see what they can help with.   Any transportation issues/concerns?: no issues   Any patient concerns? No concerns at this time   Confirmed importance and date/time of follow-up visits scheduled: yes, scheduled for 10/9 at 12noon   Confirmed with patient if condition begins to worsen call PCP or go to the ER.  Patient was given the Call-a-Nurse line [phone]: yes

## 2016-10-25 NOTE — Telephone Encounter (Signed)
Amy from [facility] has requested a call to discuss if lisinopril is a prescribed medication for pt.  Contact [phone]

## 2016-10-25 NOTE — Telephone Encounter (Signed)
Called back went over all information she will call if we can help at all.

## 2016-10-25 NOTE — Telephone Encounter (Signed)
I am ok with the noon appt, but the schedule states not to schedule lunch time appt secondary to all staff meeting.  Is this going to be a problem?

## 2016-10-27 DIAGNOSIS — F329 Major depressive disorder, single episode, unspecified: Secondary | ICD-10-CM | POA: Diagnosis not present

## 2016-10-27 DIAGNOSIS — Z7902 Long term (current) use of antithrombotics/antiplatelets: Secondary | ICD-10-CM | POA: Diagnosis not present

## 2016-10-27 DIAGNOSIS — K219 Gastro-esophageal reflux disease without esophagitis: Secondary | ICD-10-CM | POA: Diagnosis not present

## 2016-10-27 DIAGNOSIS — E43 Unspecified severe protein-calorie malnutrition: Secondary | ICD-10-CM | POA: Diagnosis not present

## 2016-10-27 DIAGNOSIS — D539 Nutritional anemia, unspecified: Secondary | ICD-10-CM | POA: Diagnosis not present

## 2016-10-27 DIAGNOSIS — Z87891 Personal history of nicotine dependence: Secondary | ICD-10-CM | POA: Diagnosis not present

## 2016-10-27 DIAGNOSIS — I10 Essential (primary) hypertension: Secondary | ICD-10-CM | POA: Diagnosis not present

## 2016-10-27 DIAGNOSIS — Z8673 Personal history of transient ischemic attack (TIA), and cerebral infarction without residual deficits: Secondary | ICD-10-CM | POA: Diagnosis not present

## 2016-10-27 DIAGNOSIS — Z9181 History of falling: Secondary | ICD-10-CM | POA: Diagnosis not present

## 2016-10-31 DIAGNOSIS — E43 Unspecified severe protein-calorie malnutrition: Secondary | ICD-10-CM | POA: Diagnosis not present

## 2016-10-31 DIAGNOSIS — F329 Major depressive disorder, single episode, unspecified: Secondary | ICD-10-CM | POA: Diagnosis not present

## 2016-10-31 DIAGNOSIS — Z9181 History of falling: Secondary | ICD-10-CM | POA: Diagnosis not present

## 2016-10-31 DIAGNOSIS — I10 Essential (primary) hypertension: Secondary | ICD-10-CM | POA: Diagnosis not present

## 2016-10-31 DIAGNOSIS — D539 Nutritional anemia, unspecified: Secondary | ICD-10-CM | POA: Diagnosis not present

## 2016-10-31 DIAGNOSIS — K219 Gastro-esophageal reflux disease without esophagitis: Secondary | ICD-10-CM | POA: Diagnosis not present

## 2016-11-01 ENCOUNTER — Ambulatory Visit (INDEPENDENT_AMBULATORY_CARE_PROVIDER_SITE_OTHER): Payer: Medicare Other | Admitting: Internal Medicine

## 2016-11-01 ENCOUNTER — Encounter: Payer: Self-pay | Admitting: Internal Medicine

## 2016-11-01 DIAGNOSIS — N289 Disorder of kidney and ureter, unspecified: Secondary | ICD-10-CM | POA: Diagnosis not present

## 2016-11-01 DIAGNOSIS — R410 Disorientation, unspecified: Secondary | ICD-10-CM | POA: Diagnosis not present

## 2016-11-01 DIAGNOSIS — I10 Essential (primary) hypertension: Secondary | ICD-10-CM

## 2016-11-01 DIAGNOSIS — E7800 Pure hypercholesterolemia, unspecified: Secondary | ICD-10-CM | POA: Diagnosis not present

## 2016-11-01 DIAGNOSIS — Z8673 Personal history of transient ischemic attack (TIA), and cerebral infarction without residual deficits: Secondary | ICD-10-CM

## 2016-11-01 DIAGNOSIS — F439 Reaction to severe stress, unspecified: Secondary | ICD-10-CM | POA: Diagnosis not present

## 2016-11-01 DIAGNOSIS — D509 Iron deficiency anemia, unspecified: Secondary | ICD-10-CM

## 2016-11-01 NOTE — Progress Notes (Signed)
Patient ID: Rachel Martinez, female   DOB: January 15, 1928, 83 y.o.   MRN: [id number]   Subjective:    Patient ID: Rachel Martinez, female    DOB: 07-30-1927, 83 y.o.   MRN: [id number]  HPI  Patient here for hospital follow up.  She is accompanied by her daughter-n-law Langley Gauss).  History obtained from both of them.  She was admitted 10/22/16 after being found collapsed down on the floor at her home.  W/up in the hospital included normal MRI.  EEG - negative.  Per note, discussed with neurology.  Etiology remains unclear.  She has not had another episode since discharge.  Discussed further evaluation and f/u with neurology.  She is in agreement.  She has a remote history of CVA.  MRI revealed nothing acute.  She remained weak.  PT consulted.  Home health PT arranged.  Since her discharge, has done relatively well. PT has now started.  No chest pain.  Eating.  Family staying with her.  No nausea or vomiting.  Bowels moving.     Past Medical History:  Diagnosis Date  . Anemia   . Chicken pox   . CVA (cerebral vascular accident) ([place]) 8/06  . Depression   . GERD (gastroesophageal reflux disease)   . Heart palpitations   . Hypercholesteremia   . Hypertension   . Stroke [facility])    Past Surgical History:  Procedure Laterality Date  . APPENDECTOMY     Family History  Problem Relation Age of Onset  . CVA Mother   . Diabetes Father   . Heart disease Father   . Cancer Sister        breast   . Cancer Brother        stomach  . Leukemia Brother   . Lung disease Brother   . Hypertension Sister   . Cancer Sister        stomach  . Mental illness Brother        suicide  . Breast cancer Paternal Aunt    Social History   Social History  . Marital status: Married    Spouse name: N/A  . Number of children: N/A  . Years of education: N/A   Social History Main Topics  . Smoking status: Former Smoker    Packs/day: 0.25    Years: 30.00    Quit date: 09/13/2003  . Smokeless tobacco: Never Used  .  Alcohol use No  . Drug use: No  . Sexual activity: No   Other Topics Concern  . None   Social History Narrative  . None    Outpatient Encounter Prescriptions as of 11/01/2016  Medication Sig  . calcium carbonate (TUMS - DOSED IN MG ELEMENTAL CALCIUM) 500 MG chewable tablet Chew 1 tablet by mouth 3 (three) times daily as needed.   . clopidogrel (PLAVIX) 75 MG tablet take 1 tablet by mouth once daily  . Fe Fum-FePoly-Vit C-Vit B3 (INTEGRA) 62.5-62.5-40-3 MG CAPS Take 1 capsule by mouth daily.   . feeding supplement, ENSURE ENLIVE, (ENSURE ENLIVE) LIQD Take 237 mLs by mouth 2 (two) times daily between meals.  Marland Kitchen lisinopril (PRINIVIL,ZESTRIL) 10 MG tablet Take 1 tablet (10 mg total) by mouth daily.  . pravastatin (PRAVACHOL) 40 MG tablet take 1 tablet by mouth once daily  . sertraline (ZOLOFT) 50 MG tablet take 1 tablet by mouth once daily   No facility-administered encounter medications on file as of 11/01/2016.     Review of Systems  Constitutional: Negative  for appetite change and unexpected weight change.  HENT: Negative for congestion and sinus pressure.   Respiratory: Negative for cough, chest tightness and shortness of breath.   Cardiovascular: Negative for chest pain, palpitations and leg swelling.  Gastrointestinal: Negative for abdominal pain, diarrhea, nausea and vomiting.  Genitourinary: Negative for difficulty urinating and dysuria.  Musculoskeletal: Negative for joint swelling and myalgias.  Skin: Negative for color change and rash.  Neurological: Negative for dizziness, light-headedness and headaches.  Psychiatric/Behavioral: Negative for agitation and dysphoric mood.       Objective:     Blood pressure rechecked by me:  132/72  Physical Exam  Constitutional: She appears well-developed and well-nourished. No distress.  HENT:  Nose: Nose normal.  Mouth/Throat: Oropharynx is clear and moist.  Neck: Neck supple. No thyromegaly present.  Cardiovascular: Normal rate  and regular rhythm.   Pulmonary/Chest: Breath sounds normal. No respiratory distress. She has no wheezes.  Abdominal: Soft. Bowel sounds are normal. There is no tenderness.  Musculoskeletal: She exhibits no edema or tenderness.  Lymphadenopathy:    She has no cervical adenopathy.  Skin: No rash noted. No erythema.  Psychiatric: She has a normal mood and affect. Her behavior is normal.    BP (!) 146/88 (BP Location: Left Arm, Patient Position: Sitting, Cuff Size: Normal)   Pulse 63   Temp 98.2 F (36.8 C) (Oral)   Resp 14   Ht 5\' 2"  (1.575 m)   Wt 120 lb 3.2 oz (54.5 kg)   SpO2 97%   BMI 21.98 kg/m  Wt Readings from Last 3 Encounters:  11/01/16 120 lb 3.2 oz (54.5 kg)  10/22/16 110 lb (49.9 kg)  09/28/16 118 lb 5 oz (53.7 kg)     Lab Results  Component Value Date   WBC 10.8 10/22/2016   HGB 12.6 10/22/2016   HCT 37.8 10/22/2016   PLT 152 10/22/2016   GLUCOSE 119 (H) 10/22/2016   CHOL 164 08/30/2016   TRIG 93.0 08/30/2016   HDL 67.10 08/30/2016   LDLCALC 79 08/30/2016   ALT 13 (L) 10/22/2016   AST 33 10/22/2016   NA 143 10/22/2016   K 4.2 10/22/2016   CL 109 10/22/2016   CREATININE 1.01 (H) 10/22/2016   BUN 23 (H) 10/22/2016   CO2 27 10/22/2016   TSH 1.747 10/22/2016       Assessment & Plan:   Problem List Items Addressed This Visit    Altered mental state    Recently admitted after being found down.  Unclear etiology.  W/up as outlined.  No reoccurring episodes.  EEG - normal.  Refer to neurology for f/u and question of need for any further w/up.  Pt in agreement.        Relevant Orders   Ambulatory referral to Neurology   History of CVA (cerebrovascular accident)    Has done well on plavix.  Follow.        Hypercholesterolemia    On pravastatin.  Low cholesterol diet and exercise.  Follow lipid panel and liver function tests.       Hypertension    Blood pressure on recheck improved.  Same medication regimen.  Follow pressures.  Follow metabolic  panel.        Iron deficiency anemia    Being followed by hematology.  Received iron infusion.  Last hgb wnl.  Follow.        Renal insufficiency    Creatinine on recent check 10/22/16 - 1.01.  Follow.  Stress    Increased stress previously with caring for her husband.  Family staying with her now and helping.  Follow.            Einar Pheasant, MD

## 2016-11-02 DIAGNOSIS — K219 Gastro-esophageal reflux disease without esophagitis: Secondary | ICD-10-CM | POA: Diagnosis not present

## 2016-11-02 DIAGNOSIS — E43 Unspecified severe protein-calorie malnutrition: Secondary | ICD-10-CM | POA: Diagnosis not present

## 2016-11-02 DIAGNOSIS — Z9181 History of falling: Secondary | ICD-10-CM | POA: Diagnosis not present

## 2016-11-02 DIAGNOSIS — D539 Nutritional anemia, unspecified: Secondary | ICD-10-CM | POA: Diagnosis not present

## 2016-11-02 DIAGNOSIS — I10 Essential (primary) hypertension: Secondary | ICD-10-CM | POA: Diagnosis not present

## 2016-11-02 DIAGNOSIS — F329 Major depressive disorder, single episode, unspecified: Secondary | ICD-10-CM | POA: Diagnosis not present

## 2016-11-04 ENCOUNTER — Encounter: Payer: Self-pay | Admitting: Internal Medicine

## 2016-11-04 NOTE — Assessment & Plan Note (Signed)
Creatinine on recent check 10/22/16 - 1.01.  Follow.

## 2016-11-04 NOTE — Assessment & Plan Note (Signed)
Recently admitted after being found down.  Unclear etiology.  W/up as outlined.  No reoccurring episodes.  EEG - normal.  Refer to neurology for f/u and question of need for any further w/up.  Pt in agreement.

## 2016-11-04 NOTE — Assessment & Plan Note (Signed)
Being followed by hematology.  Received iron infusion.  Last hgb wnl.  Follow.

## 2016-11-04 NOTE — Assessment & Plan Note (Signed)
Increased stress previously with caring for her husband.  Family staying with her now and helping.  Follow.

## 2016-11-04 NOTE — Assessment & Plan Note (Signed)
Has done well on plavix.  Follow.

## 2016-11-04 NOTE — Assessment & Plan Note (Signed)
Blood pressure on recheck improved.  Same medication regimen.  Follow pressures.  Follow metabolic panel.   

## 2016-11-04 NOTE — Assessment & Plan Note (Signed)
On pravastatin.  Low cholesterol diet and exercise.  Follow lipid panel and liver function tests.   

## 2016-11-08 DIAGNOSIS — E43 Unspecified severe protein-calorie malnutrition: Secondary | ICD-10-CM | POA: Diagnosis not present

## 2016-11-08 DIAGNOSIS — K219 Gastro-esophageal reflux disease without esophagitis: Secondary | ICD-10-CM | POA: Diagnosis not present

## 2016-11-08 DIAGNOSIS — F329 Major depressive disorder, single episode, unspecified: Secondary | ICD-10-CM | POA: Diagnosis not present

## 2016-11-08 DIAGNOSIS — I10 Essential (primary) hypertension: Secondary | ICD-10-CM | POA: Diagnosis not present

## 2016-11-08 DIAGNOSIS — D539 Nutritional anemia, unspecified: Secondary | ICD-10-CM | POA: Diagnosis not present

## 2016-11-08 DIAGNOSIS — Z9181 History of falling: Secondary | ICD-10-CM | POA: Diagnosis not present

## 2016-11-10 ENCOUNTER — Telehealth: Payer: Self-pay

## 2016-11-10 DIAGNOSIS — E43 Unspecified severe protein-calorie malnutrition: Secondary | ICD-10-CM | POA: Diagnosis not present

## 2016-11-10 DIAGNOSIS — D539 Nutritional anemia, unspecified: Secondary | ICD-10-CM | POA: Diagnosis not present

## 2016-11-10 DIAGNOSIS — F329 Major depressive disorder, single episode, unspecified: Secondary | ICD-10-CM | POA: Diagnosis not present

## 2016-11-10 DIAGNOSIS — K219 Gastro-esophageal reflux disease without esophagitis: Secondary | ICD-10-CM | POA: Diagnosis not present

## 2016-11-10 DIAGNOSIS — I10 Essential (primary) hypertension: Secondary | ICD-10-CM | POA: Diagnosis not present

## 2016-11-10 DIAGNOSIS — Z9181 History of falling: Secondary | ICD-10-CM | POA: Diagnosis not present

## 2016-11-10 NOTE — Telephone Encounter (Signed)
Form received placed in your red folder.

## 2016-11-14 DIAGNOSIS — K219 Gastro-esophageal reflux disease without esophagitis: Secondary | ICD-10-CM | POA: Diagnosis not present

## 2016-11-14 DIAGNOSIS — Z9181 History of falling: Secondary | ICD-10-CM | POA: Diagnosis not present

## 2016-11-14 DIAGNOSIS — I10 Essential (primary) hypertension: Secondary | ICD-10-CM | POA: Diagnosis not present

## 2016-11-14 DIAGNOSIS — D539 Nutritional anemia, unspecified: Secondary | ICD-10-CM | POA: Diagnosis not present

## 2016-11-14 DIAGNOSIS — E43 Unspecified severe protein-calorie malnutrition: Secondary | ICD-10-CM | POA: Diagnosis not present

## 2016-11-14 DIAGNOSIS — F329 Major depressive disorder, single episode, unspecified: Secondary | ICD-10-CM | POA: Diagnosis not present

## 2016-11-14 NOTE — Telephone Encounter (Signed)
Form placed back in box with note - questions about medication.  Our note and discharge summary has pt on Lisinopril.  Need to know when and who prescribed diltiazem.  Per home health note - she is on diltiazem and does not have listed lisinopril.  Need to clarify.  May need to contact pharmacy to help clarify.

## 2016-11-14 NOTE — Telephone Encounter (Signed)
Called patient in bathroom would like [place] to call back in a few.

## 2016-11-16 DIAGNOSIS — K219 Gastro-esophageal reflux disease without esophagitis: Secondary | ICD-10-CM | POA: Diagnosis not present

## 2016-11-16 DIAGNOSIS — E43 Unspecified severe protein-calorie malnutrition: Secondary | ICD-10-CM | POA: Diagnosis not present

## 2016-11-16 DIAGNOSIS — D539 Nutritional anemia, unspecified: Secondary | ICD-10-CM | POA: Diagnosis not present

## 2016-11-16 DIAGNOSIS — I10 Essential (primary) hypertension: Secondary | ICD-10-CM | POA: Diagnosis not present

## 2016-11-16 DIAGNOSIS — F329 Major depressive disorder, single episode, unspecified: Secondary | ICD-10-CM | POA: Diagnosis not present

## 2016-11-16 DIAGNOSIS — Z9181 History of falling: Secondary | ICD-10-CM | POA: Diagnosis not present

## 2016-11-16 NOTE — Telephone Encounter (Signed)
Called patient states that she is not taking Lisinopril but she is taking Dilitazem. I have put form back in red folder for your corrections. She could not tell me who gave her the Diltiazem.

## 2016-11-17 NOTE — Telephone Encounter (Signed)
Please call pharmacy and find out who prescribed.   Need info from office visit, if has seen another md.

## 2016-11-17 NOTE — Telephone Encounter (Signed)
Called patient states she has not pick up from any other pharmacy. She states that she is taking meds every day as directed. I still have form and message bellow is information from pharmacy.

## 2016-11-17 NOTE — Telephone Encounter (Signed)
Called pharmacy we have listed states that patient has not picked up any scripts from them after 09/22/16. Last time Diltiazem was filled was written by you on 03/31/16 and picked up 8/30. She has not received script for Lisinapril after 2015. I have called patient to see if she is using another pharmacy but number was busy. Form in my red folder.

## 2016-11-18 DIAGNOSIS — E43 Unspecified severe protein-calorie malnutrition: Secondary | ICD-10-CM | POA: Diagnosis not present

## 2016-11-18 DIAGNOSIS — Z9181 History of falling: Secondary | ICD-10-CM | POA: Diagnosis not present

## 2016-11-18 DIAGNOSIS — Z7902 Long term (current) use of antithrombotics/antiplatelets: Secondary | ICD-10-CM | POA: Diagnosis not present

## 2016-11-18 DIAGNOSIS — K219 Gastro-esophageal reflux disease without esophagitis: Secondary | ICD-10-CM | POA: Diagnosis not present

## 2016-11-18 DIAGNOSIS — Z87891 Personal history of nicotine dependence: Secondary | ICD-10-CM | POA: Diagnosis not present

## 2016-11-18 DIAGNOSIS — I10 Essential (primary) hypertension: Secondary | ICD-10-CM | POA: Diagnosis not present

## 2016-11-18 DIAGNOSIS — F329 Major depressive disorder, single episode, unspecified: Secondary | ICD-10-CM | POA: Diagnosis not present

## 2016-11-18 DIAGNOSIS — Z8673 Personal history of transient ischemic attack (TIA), and cerebral infarction without residual deficits: Secondary | ICD-10-CM | POA: Diagnosis not present

## 2016-11-18 DIAGNOSIS — D539 Nutritional anemia, unspecified: Secondary | ICD-10-CM | POA: Diagnosis not present

## 2016-11-18 NOTE — Telephone Encounter (Signed)
Signed.  Please correct our med list.  Thanks

## 2016-11-18 NOTE — Telephone Encounter (Signed)
Faxed

## 2016-11-24 ENCOUNTER — Other Ambulatory Visit: Payer: Self-pay | Admitting: Internal Medicine

## 2016-12-17 ENCOUNTER — Other Ambulatory Visit: Payer: Self-pay | Admitting: Internal Medicine

## 2016-12-20 NOTE — Progress Notes (Deleted)
Hematology/Oncology Follow Up [facility] [phone] [phone]   Patient Care Team: Einar Pheasant, MD as PCP - General (Internal Medicine)  CHIEF COMPLAINTS/PURPOSE OF CONSULTATION:     HISTORY OF PRESENTING ILLNESS:  Rachel Martinez 83 y.o.  female with past medical history listed as below who was referred to me by Dr.London for evaluation of anemia.  Patient has a history of gastritis. She had EGD done in 2015 April which showed normal esophagus, duodenum. Gastric ulcer was clean base, erosive gastritis biopsied. Biopsy showed reactive gastropathy and focal intestinal metaplasia. No active inflammation atrophy is plus CL or malignancy. H pylori was not identified. Patient follows up with primary care provider Dr. Nicki Reaper and had recent labs were done. She was found to be anemic with a hemoglobin of 8.1, which is down from 12 about a year ago. Differential was normal. WBC and platelet counts were normal. Iron panels were checked. Ferritin 7.4, iron 40, saturation ratios 8.2 transferritin 350.   Patient reports feeling profoundly fatigued. She is also quite stressed during this time as her husband just had a stroke. 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 She takes Plavix for stroke prevention. She denies any black stool or blood in the stool, stomach discomfort, abdominal pain, chest pain, shortness of breath. She started taking oral iron supplementation recently.  INTERVAL HISTORY Patient presents to discuss about the results. No new complaints. 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   ROS:  Review of Systems  Constitutional: Positive for fatigue.  HENT:  Negative.   Eyes: Negative.   Respiratory: Negative.   Cardiovascular: Negative.   Gastrointestinal: Negative.   Endocrine: Negative.   Genitourinary: Negative.    Musculoskeletal:  Negative.   Skin: Negative.   Neurological: Negative.   Hematological: Negative.   Psychiatric/Behavioral: Negative.     MEDICAL HISTORY:  Past Medical History:  Diagnosis Date  . Anemia   . Chicken pox   . CVA (cerebral vascular accident) ([place]) 8/06  . Depression   . GERD (gastroesophageal reflux disease)   . Heart palpitations   . Hypercholesteremia   . Hypertension   . Stroke [facility])     SURGICAL HISTORY: Past Surgical History:  Procedure Laterality Date  . APPENDECTOMY      SOCIAL HISTORY: Social History   Socioeconomic History  . Marital status: Married    Spouse name: Not on file  . Number of children: Not on file  . Years of education: Not on file  . Highest education level: Not on file  Social Needs  . Financial resource strain: Not on file  . Food insecurity - worry: Not on file  . Food insecurity - inability: Not on file  . Transportation needs - medical: Not on file  . Transportation needs - non-medical: Not on file  Occupational History  . Not on file  Tobacco Use  . Smoking status: Former Smoker    Packs/day: 0.25    Years: 30.00    Pack years: 7.50    Last attempt to quit: 09/13/2003    Years since quitting: 13.2  . Smokeless tobacco: Never Used  Substance and Sexual Activity  . Alcohol use: No    Alcohol/week: 0.0 oz  . Drug use: No  . Sexual activity: No  Other Topics Concern  . Not on file  Social History Narrative  . Not on file  FAMILY HISTORY: Family History  Problem Relation Age of Onset  . CVA Mother   . Diabetes Father   . Heart disease Father   . Cancer Sister        breast   . Cancer Brother        stomach  . Leukemia Brother   . Lung disease Brother   . Hypertension Sister   . Cancer Sister        stomach  . Mental illness Brother        suicide  . Breast cancer Paternal Aunt     ALLERGIES:  is allergic to amoxicillin-pot clavulanate; ciprofloxacin; codeine sulfate; imipenem; and metronidazole.  MEDICATIONS:   Current Outpatient Medications  Medication Sig Dispense Refill  . calcium carbonate (TUMS - DOSED IN MG ELEMENTAL CALCIUM) 500 MG chewable tablet Chew 1 tablet by mouth 3 (three) times daily as needed.     . clopidogrel (PLAVIX) 75 MG tablet take 1 tablet by mouth once daily 30 tablet 5  . diltiazem (CARDIZEM) 120 MG tablet Take 120 mg by mouth 4 (four) times daily.    Marland Kitchen diltiazem (TIAZAC) 120 MG 24 hr capsule take 1 capsule by mouth once daily 30 capsule 5  . Fe Fum-FePoly-Vit C-Vit B3 (INTEGRA) 62.5-62.5-40-3 MG CAPS Take 1 capsule by mouth daily.   1  . feeding supplement, ENSURE ENLIVE, (ENSURE ENLIVE) LIQD Take 237 mLs by mouth 2 (two) times daily between meals. 237 mL 12  . lisinopril (PRINIVIL,ZESTRIL) 10 MG tablet Take 1 tablet (10 mg total) by mouth daily. 30 tablet 5  . pravastatin (PRAVACHOL) 40 MG tablet take 1 tablet by mouth once daily 30 tablet 5  . sertraline (ZOLOFT) 50 MG tablet take 1 tablet by mouth once daily 30 tablet 2   No current facility-administered medications for this visit.       Marland Kitchen  PHYSICAL EXAMINATION: ECOG PERFORMANCE STATUS: 1 - Symptomatic but completely ambulatory There were no vitals filed for this visit. There were no vitals filed for this visit.  GENE, Pale, no distress and comfortable.  EYESNot icteric, conjunctivae pale PHARYNX: no thrush or ulceration; good dentition  NECK: supple, no masses felt LYMPH:  no palpable lymphadenopathy in the cervical, axillary or inguinal regions LUNGS: clear to auscultation and  No wheeze or crackles HEART/CVS: regular rate & rhythm and no murmurs; No lower extremity edema ABDOMEN: abdomen soft, non-tender and normal bowel sounds Musculoskeletal:no cyanosis of digits and no clubbing  PSYCH: alert & oriented x 3  NEURO: no focal motor/sensory deficits SKIN:  no rashes or significant lesions  LABORATORY DATA:  I have reviewed the data as listed CBC CBC    Component Value Date/Time   WBC 10.8 10/22/2016  1415   RBC 4.50 10/22/2016 1415   HGB 12.6 10/22/2016 1415   HGB 12.9 10/11/2013 1054   HCT 37.8 10/22/2016 1415   HCT 38.9 10/11/2013 1054   PLT 152 10/22/2016 1415   PLT 180 10/11/2013 1054   MCV 84.1 10/22/2016 1415   MCV 90 10/11/2013 1054   MCH 27.9 10/22/2016 1415   MCHC 33.2 10/22/2016 1415   RDW 23.6 (H) 10/22/2016 1415   RDW 14.7 (H) 10/11/2013 1054   LYMPHSABS 0.4 (L) 10/22/2016 1415   LYMPHSABS 1.4 10/11/2013 1054   MONOABS 0.2 10/22/2016 1415   MONOABS 0.3 10/11/2013 1054   EOSABS 0.0 10/22/2016 1415   EOSABS 0.0 10/11/2013 1054   BASOSABS 0.0 10/22/2016 1415   BASOSABS 0.0 10/11/2013 1054  Recent Labs    02/02/16 1345 08/30/16 1642 10/22/16 1415  NA 143 142 143  K 4.4 4.5 4.2  CL 106 107 109  CO2 30 29 27   GLUCOSE 63* 89 119*  BUN 20 28* 23*  CREATININE 1.01 1.09 1.01*  CALCIUM 9.1 8.7 8.9  GFRNONAA  --   --  49*  GFRAA  --   --  56*  PROT 6.3 6.3 6.5  ALBUMIN 4.0 4.1 3.9  AST 20 21 33  ALT 8 9 13*  ALKPHOS 70 70 59  BILITOT 0.3 0.3 0.4  BILIDIR 0.1 0.1 0.1  IBILI  --   --  0.3   Iron/TIBC/Ferritin/ %Sat    Component Value Date/Time   IRON 40 (L) 09/01/2016 1024   IRON 103 10/11/2013 1055   TIBC 354 10/11/2013 1055   FERRITIN 7.4 (L) 09/01/2016 1024   FERRITIN 40 10/11/2013 1054   IRONPCTSAT 8.2 (L) 09/01/2016 1024   IRONPCTSAT 29 10/11/2013 1055     ASSESSMENT & PLAN:  No diagnosis found. S/p 1 dose of Venofer, Hb responds very well, up to 10. Finish another 4 weekly IV iron infusion with Venofer.   # source of blood loss, s/p EGD. Will obtain EGD reports.  All questions were answered. The patient knows to call the clinic with any problems questions or concerns.  Return of visit: 2 months with repeat cbc, iron ferritin.  Earlie Server, MD, PhD Hematology Oncology [facility] Pager- [phone] 12/20/2016

## 2016-12-21 ENCOUNTER — Inpatient Hospital Stay: Payer: Medicare Other

## 2016-12-21 ENCOUNTER — Inpatient Hospital Stay: Payer: Medicare Other | Admitting: Oncology

## 2016-12-22 ENCOUNTER — Telehealth: Payer: Self-pay | Admitting: *Deleted

## 2016-12-22 NOTE — Telephone Encounter (Signed)
Called patient on 12/22/16 @ 9:00 to Resched her appts.  Patient stated she would call back when she was able to RTC,  due to having to take care of her husband because he had recently had a stroke.

## 2016-12-23 ENCOUNTER — Other Ambulatory Visit: Payer: Self-pay | Admitting: Internal Medicine

## 2017-01-23 ENCOUNTER — Ambulatory Visit: Payer: Medicare Other | Admitting: Internal Medicine

## 2017-02-19 ENCOUNTER — Other Ambulatory Visit: Payer: Self-pay | Admitting: Internal Medicine

## 2017-04-17 ENCOUNTER — Encounter: Payer: Self-pay | Admitting: Internal Medicine

## 2017-04-17 ENCOUNTER — Ambulatory Visit (INDEPENDENT_AMBULATORY_CARE_PROVIDER_SITE_OTHER): Payer: Medicare Other | Admitting: Internal Medicine

## 2017-04-17 VITALS — BP 112/64 | HR 57 | Temp 97.9°F | Resp 18 | Ht 62.0 in | Wt 117.8 lb

## 2017-04-17 DIAGNOSIS — R200 Anesthesia of skin: Secondary | ICD-10-CM | POA: Diagnosis not present

## 2017-04-17 DIAGNOSIS — N289 Disorder of kidney and ureter, unspecified: Secondary | ICD-10-CM

## 2017-04-17 DIAGNOSIS — R209 Unspecified disturbances of skin sensation: Secondary | ICD-10-CM | POA: Diagnosis not present

## 2017-04-17 DIAGNOSIS — F439 Reaction to severe stress, unspecified: Secondary | ICD-10-CM

## 2017-04-17 DIAGNOSIS — E7800 Pure hypercholesterolemia, unspecified: Secondary | ICD-10-CM | POA: Diagnosis not present

## 2017-04-17 DIAGNOSIS — D509 Iron deficiency anemia, unspecified: Secondary | ICD-10-CM

## 2017-04-17 DIAGNOSIS — Z8673 Personal history of transient ischemic attack (TIA), and cerebral infarction without residual deficits: Secondary | ICD-10-CM

## 2017-04-17 DIAGNOSIS — K259 Gastric ulcer, unspecified as acute or chronic, without hemorrhage or perforation: Secondary | ICD-10-CM

## 2017-04-17 DIAGNOSIS — I10 Essential (primary) hypertension: Secondary | ICD-10-CM | POA: Diagnosis not present

## 2017-04-17 LAB — CBC WITH DIFFERENTIAL/PLATELET
BASOS PCT: 0.7 % (ref 0.0–3.0)
Basophils Absolute: 0 10*3/uL (ref 0.0–0.1)
EOS ABS: 0.1 10*3/uL (ref 0.0–0.7)
Eosinophils Relative: 1.8 % (ref 0.0–5.0)
HCT: 38.1 % (ref 36.0–46.0)
HEMOGLOBIN: 12.9 g/dL (ref 12.0–15.0)
Lymphocytes Relative: 29.6 % (ref 12.0–46.0)
Lymphs Abs: 1.7 10*3/uL (ref 0.7–4.0)
MCHC: 33.7 g/dL (ref 30.0–36.0)
MCV: 93.5 fl (ref 78.0–100.0)
MONO ABS: 0.4 10*3/uL (ref 0.1–1.0)
Monocytes Relative: 6.8 % (ref 3.0–12.0)
Neutro Abs: 3.5 10*3/uL (ref 1.4–7.7)
Neutrophils Relative %: 61.1 % (ref 43.0–77.0)
PLATELETS: 206 10*3/uL (ref 150.0–400.0)
RBC: 4.08 Mil/uL (ref 3.87–5.11)
RDW: 13.6 % (ref 11.5–15.5)
WBC: 5.7 10*3/uL (ref 4.0–10.5)

## 2017-04-17 LAB — BASIC METABOLIC PANEL
BUN: 22 mg/dL (ref 6–23)
CHLORIDE: 103 meq/L (ref 96–112)
CO2: 30 meq/L (ref 19–32)
Calcium: 9.2 mg/dL (ref 8.4–10.5)
Creatinine, Ser: 1.02 mg/dL (ref 0.40–1.20)
GFR: 54.34 mL/min — ABNORMAL LOW (ref >60.00)
Glucose, Bld: 80 mg/dL (ref 70–99)
Potassium: 3.9 mEq/L (ref 3.5–5.1)
Sodium: 141 mEq/L (ref 135–145)

## 2017-04-17 LAB — FERRITIN: FERRITIN: 187.9 ng/mL (ref 10.0–291.0)

## 2017-04-17 LAB — HEPATIC FUNCTION PANEL
ALT: 8 U/L (ref 0–35)
AST: 21 U/L (ref 0–37)
Albumin: 3.8 g/dL (ref 3.5–5.2)
Alkaline Phosphatase: 76 U/L (ref 39–117)
BILIRUBIN TOTAL: 0.4 mg/dL (ref 0.2–1.2)
Bilirubin, Direct: 0.1 mg/dL (ref 0.0–0.3)
Total Protein: 6.8 g/dL (ref 6.0–8.3)

## 2017-04-17 LAB — TSH: TSH: 2.67 u[IU]/mL (ref 0.35–4.50)

## 2017-04-17 LAB — VITAMIN B12: Vitamin B-12: 510 pg/mL (ref 211–911)

## 2017-04-17 NOTE — Progress Notes (Signed)
Patient ID: Rachel Martinez, female   DOB: 1927-02-21, 84 y.o.   MRN: [id number]   Subjective:    Patient ID: Rachel Martinez, female    DOB: April 04, 1927, 84 y.o.   MRN: [id number]  HPI  Patient here for a scheduled follow up.  She reports she is doing relatively well.  Has some help at home.  Still with increased stress in dealing with her husband's health issues.  Does not feel needs any further intervention.  No chest pain.  No sob.  No acid reflux.  No abdominal pain.  Eating.  Bowels stable.  Reports if she sits for a while, will notice that her feet will feel like they are asleep.  Once she is up moving, better.  No weakness.  No other neurological changes.  Desires not to pursue any further testing or intervention.     Past Medical History:  Diagnosis Date  . Anemia   . Chicken pox   . CVA (cerebral vascular accident) ([place]) 8/06  . Depression   . GERD (gastroesophageal reflux disease)   . Heart palpitations   . Hypercholesteremia   . Hypertension   . Stroke [facility])    Past Surgical History:  Procedure Laterality Date  . APPENDECTOMY     Family History  Problem Relation Age of Onset  . CVA Mother   . Diabetes Father   . Heart disease Father   . Cancer Sister        breast   . Cancer Brother        stomach  . Leukemia Brother   . Lung disease Brother   . Hypertension Sister   . Cancer Sister        stomach  . Mental illness Brother        suicide  . Breast cancer Paternal Aunt    Social History   Socioeconomic History  . Marital status: Married    Spouse name: Not on file  . Number of children: Not on file  . Years of education: Not on file  . Highest education level: Not on file  Occupational History  . Not on file  Social Needs  . Financial resource strain: Not on file  . Food insecurity:    Worry: Not on file    Inability: Not on file  . Transportation needs:    Medical: Not on file    Non-medical: Not on file  Tobacco Use  . Smoking status: Former  Smoker    Packs/day: 0.25    Years: 30.00    Pack years: 7.50    Last attempt to quit: 09/13/2003    Years since quitting: 13.6  . Smokeless tobacco: Never Used  Substance and Sexual Activity  . Alcohol use: No    Alcohol/week: 0.0 oz  . Drug use: No  . Sexual activity: Never  Lifestyle  . Physical activity:    Days per week: Not on file    Minutes per session: Not on file  . Stress: Not on file  Relationships  . Social connections:    Talks on phone: Not on file    Gets together: Not on file    Attends religious service: Not on file    Active member of club or organization: Not on file    Attends meetings of clubs or organizations: Not on file    Relationship status: Not on file  Other Topics Concern  . Not on file  Social History Narrative  . Not on  file    Outpatient Encounter Medications as of 04/17/2017  Medication Sig  . calcium carbonate (TUMS - DOSED IN MG ELEMENTAL CALCIUM) 500 MG chewable tablet Chew 1 tablet by mouth 3 (three) times daily as needed.   . clopidogrel (PLAVIX) 75 MG tablet take 1 tablet by mouth once daily  . diltiazem (CARDIZEM) 120 MG tablet Take 120 mg by mouth 4 (four) times daily.  Marland Kitchen diltiazem (TIAZAC) 120 MG 24 hr capsule take 1 capsule by mouth once daily  . Fe Fum-FePoly-Vit C-Vit B3 (INTEGRA) 62.5-62.5-40-3 MG CAPS Take 1 capsule by mouth daily.   . feeding supplement, ENSURE ENLIVE, (ENSURE ENLIVE) LIQD Take 237 mLs by mouth 2 (two) times daily between meals.  Marland Kitchen lisinopril (PRINIVIL,ZESTRIL) 10 MG tablet Take 1 tablet (10 mg total) by mouth daily.  . pantoprazole (PROTONIX) 40 MG tablet   . pravastatin (PRAVACHOL) 40 MG tablet take 1 tablet by mouth once daily  . sertraline (ZOLOFT) 50 MG tablet take 1 tablet by mouth once daily   No facility-administered encounter medications on file as of 04/17/2017.     Review of Systems  Constitutional: Negative for appetite change and unexpected weight change.  HENT: Negative for congestion and  sinus pressure.   Respiratory: Negative for cough, chest tightness and shortness of breath.   Cardiovascular: Negative for chest pain, palpitations and leg swelling.  Gastrointestinal: Negative for abdominal pain, diarrhea, nausea and vomiting.  Genitourinary: Negative for difficulty urinating and dysuria.  Musculoskeletal: Negative for joint swelling and myalgias.  Skin: Negative for color change and rash.  Neurological: Negative for dizziness, light-headedness and headaches.       Feet - feeling like asleep as outlined.    Psychiatric/Behavioral: Negative for agitation and dysphoric mood.       Objective:     Blood pressure rechecked by me:  132/70  Physical Exam  Constitutional: She appears well-developed and well-nourished. No distress.  HENT:  Nose: Nose normal.  Mouth/Throat: Oropharynx is clear and moist.  Neck: Neck supple. No thyromegaly present.  Cardiovascular: Normal rate and regular rhythm.  Pulmonary/Chest: Breath sounds normal. No respiratory distress. She has no wheezes.  Abdominal: Soft. Bowel sounds are normal. There is no tenderness.  Musculoskeletal: She exhibits no edema or tenderness.  Lymphadenopathy:    She has no cervical adenopathy.  Skin: No rash noted. No erythema.  Psychiatric: She has a normal mood and affect. Her behavior is normal.    BP 112/64 (BP Location: Left Arm, Patient Position: Sitting, Cuff Size: Normal)   Pulse (!) 57   Temp 97.9 F (36.6 C) (Oral)   Resp 18   Ht 5\' 2"  (1.575 m)   Wt 117 lb 12.8 oz (53.4 kg)   SpO2 98%   BMI 21.55 kg/m  Wt Readings from Last 3 Encounters:  04/17/17 117 lb 12.8 oz (53.4 kg)  11/01/16 120 lb 3.2 oz (54.5 kg)  10/22/16 110 lb (49.9 kg)     Lab Results  Component Value Date   WBC 5.7 04/17/2017   HGB 12.9 04/17/2017   HCT 38.1 04/17/2017   PLT 206.0 04/17/2017   GLUCOSE 80 04/17/2017   CHOL 164 08/30/2016   TRIG 93.0 08/30/2016   HDL 67.10 08/30/2016   LDLCALC 79 08/30/2016   ALT 8  04/17/2017   AST 21 04/17/2017   NA 141 04/17/2017   K 3.9 04/17/2017   CL 103 04/17/2017   CREATININE 1.02 04/17/2017   BUN 22 04/17/2017   CO2 30 04/17/2017  TSH 2.67 04/17/2017       Assessment & Plan:   Problem List Items Addressed This Visit    Gastric ulcer    Has a h/o gastric ulcer.  No upper symptoms now.  Follow cbc. On protonix.        History of CVA (cerebrovascular accident)    Doing well on plavix.        Hypercholesterolemia - Primary    On pravastatin.  Low cholesterol diet and exercise.  Follow lipid panel and liver function tests.        Relevant Orders   Hepatic function panel (Completed)   Hypertension    Blood pressure under good control.  Continue same medication regimen.  Follow pressures.  Follow metabolic panel.        Relevant Orders   TSH (Completed)   Basic metabolic panel (Completed)   Iron deficiency anemia    Has been followed by hematology.  Received iron infusion.  Follow cbc.        Relevant Orders   CBC with Differential/Platelet (Completed)   Ferritin (Completed)   Renal insufficiency    Creatinine 1.02 last check.  Stable.  Follow.        Stress    Increased stress in dealing with her husband's health issues.  She feels she is handling things relatively well.  Desires no further intervention.  Follow.         Other Visit Diagnoses    Numbness in feet       Relevant Orders   Vitamin B12 (Completed)   Altered sensation of foot       will notice if sits for a while, feel feel like their asleep.  no neurological deficit notied on exam.  desires no further intervention.  follow.        Einar Pheasant, MD

## 2017-04-22 ENCOUNTER — Encounter: Payer: Self-pay | Admitting: Internal Medicine

## 2017-04-22 NOTE — Assessment & Plan Note (Signed)
Blood pressure under good control.  Continue same medication regimen.  Follow pressures.  Follow metabolic panel.   

## 2017-04-22 NOTE — Assessment & Plan Note (Signed)
Increased stress in dealing with her husband's health issues.  She feels she is handling things relatively well.  Desires no further intervention.  Follow.

## 2017-04-22 NOTE — Assessment & Plan Note (Signed)
On pravastatin.  Low cholesterol diet and exercise.  Follow lipid panel and liver function tests.   

## 2017-04-22 NOTE — Assessment & Plan Note (Signed)
Doing well on plavix.  

## 2017-04-22 NOTE — Assessment & Plan Note (Signed)
Has a h/o gastric ulcer.  No upper symptoms now.  Follow cbc. On protonix.

## 2017-04-22 NOTE — Assessment & Plan Note (Signed)
Has been followed by hematology.  Received iron infusion.  Follow cbc.

## 2017-04-22 NOTE — Assessment & Plan Note (Signed)
Creatinine 1.02 last check.  Stable.  Follow.

## 2017-05-25 ENCOUNTER — Ambulatory Visit (INDEPENDENT_AMBULATORY_CARE_PROVIDER_SITE_OTHER): Payer: Medicare Other

## 2017-05-25 VITALS — BP 116/68 | HR 60 | Temp 98.0°F | Resp 14 | Ht 61.5 in | Wt 115.0 lb

## 2017-05-25 DIAGNOSIS — Z0000 Encounter for general adult medical examination without abnormal findings: Secondary | ICD-10-CM | POA: Diagnosis not present

## 2017-05-25 NOTE — Progress Notes (Addendum)
Subjective:   Rachel Martinez is a 84 y.o. female who presents for Medicare Annual (Subsequent) preventive examination.  Review of Systems:  No ROS.  Medicare Wellness Visit. Additional risk factors are reflected in the social history. Cardiac Risk Factors include: advanced age (>86men, >73 women);hypertension     Objective:     Vitals: BP 116/68 (BP Location: Left Arm, Patient Position: Sitting, Cuff Size: Normal)   Pulse 60   Temp 98 F (36.7 C) (Oral)   Resp 14   Ht 5' 1.5" (1.562 m)   Wt 115 lb (52.2 kg)   SpO2 98%   BMI 21.38 kg/m   Body mass index is 21.38 kg/m.  Advanced Directives 05/25/2017 10/22/2016 09/28/2016 09/12/2016 05/24/2016  Does Patient Have a Medical Advance Directive? No No Yes No No  Type of Advance Directive - - [place]  Would patient like information on creating a medical advance directive? No - Patient declined No - Patient declined - - Yes (MAU/Ambulatory/Procedural Areas - Information given)    Tobacco Social History   Tobacco Use  Smoking Status Former Smoker  . Packs/day: 0.25  . Years: 30.00  . Pack years: 7.50  . Last attempt to quit: 09/13/2003  . Years since quitting: 13.7  Smokeless Tobacco Never Used     Counseling given: Not Answered   Clinical Intake:  Pre-visit preparation completed: Yes  Pain : No/denies pain     Nutritional Status: BMI of 19-24  Normal Diabetes: No  How often do you need to have someone help you when you read instructions, pamphlets, or other written materials from your doctor or pharmacy?: 1 - Never  Interpreter Needed?: No     Past Medical History:  Diagnosis Date  . Anemia   . Chicken pox   . CVA (cerebral vascular accident) ([place]) 8/06  . Depression   . GERD (gastroesophageal reflux disease)   . Heart palpitations   . Hypercholesteremia   . Hypertension   . Stroke [facility])    Past Surgical History:  Procedure Laterality Date  . APPENDECTOMY     Family History    Problem Relation Age of Onset  . CVA Mother   . Diabetes Father   . Heart disease Father   . Cancer Sister        breast   . Diabetes Sister   . Cancer Brother        stomach  . Leukemia Brother   . Lung disease Brother   . Hypertension Sister   . Cancer Sister        stomach  . Diabetes Sister   . Mental illness Brother        suicide  . Breast cancer Paternal Aunt   . Diabetes Son    Social History   Socioeconomic History  . Marital status: Married    Spouse name: Not on file  . Number of children: Not on file  . Years of education: Not on file  . Highest education level: Not on file  Occupational History  . Not on file  Social Needs  . Financial resource strain: Not on file  . Food insecurity:    Worry: Never true    Inability: Never true  . Transportation needs:    Medical: No    Non-medical: No  Tobacco Use  . Smoking status: Former Smoker    Packs/day: 0.25    Years: 30.00    Pack years: 7.50  Last attempt to quit: 09/13/2003    Years since quitting: 13.7  . Smokeless tobacco: Never Used  Substance and Sexual Activity  . Alcohol use: No    Alcohol/week: 0.0 oz  . Drug use: No  . Sexual activity: Never  Lifestyle  . Physical activity:    Days per week: Not on file    Minutes per session: Not on file  . Stress: Not at all  Relationships  . Social connections:    Talks on phone: Not on file    Gets together: Not on file    Attends religious service: Not on file    Active member of club or organization: Not on file    Attends meetings of clubs or organizations: Not on file    Relationship status: Not on file  Other Topics Concern  . Not on file  Social History Narrative  . Not on file    Outpatient Encounter Medications as of 05/25/2017  Medication Sig  . calcium carbonate (TUMS - DOSED IN MG ELEMENTAL CALCIUM) 500 MG chewable tablet Chew 1 tablet by mouth 3 (three) times daily as needed.   . clopidogrel (PLAVIX) 75 MG tablet take 1 tablet  by mouth once daily  . diltiazem (CARDIZEM) 120 MG tablet Take 120 mg by mouth 4 (four) times daily.  Marland Kitchen diltiazem (TIAZAC) 120 MG 24 hr capsule take 1 capsule by mouth once daily  . Fe Fum-FePoly-Vit C-Vit B3 (INTEGRA) 62.5-62.5-40-3 MG CAPS Take 1 capsule by mouth daily.   . feeding supplement, ENSURE ENLIVE, (ENSURE ENLIVE) LIQD Take 237 mLs by mouth 2 (two) times daily between meals.  Marland Kitchen lisinopril (PRINIVIL,ZESTRIL) 10 MG tablet Take 1 tablet (10 mg total) by mouth daily.  . pantoprazole (PROTONIX) 40 MG tablet   . pravastatin (PRAVACHOL) 40 MG tablet take 1 tablet by mouth once daily  . sertraline (ZOLOFT) 50 MG tablet take 1 tablet by mouth once daily   No facility-administered encounter medications on file as of 05/25/2017.     Activities of Daily Living In your present state of health, do you have any difficulty performing the following activities: 05/25/2017 10/22/2016  Hearing? N N  Vision? N N  Difficulty concentrating or making decisions? N Y  Walking or climbing stairs? Y Y  Comment Unsteady gait -  Dressing or bathing? N Y  Doing errands, shopping? Y Y  Comment She does not drive Facilities manager and eating ? N -  Using the Toilet? N -  In the past six months, have you accidently leaked urine? N -  Do you have problems with loss of bowel control? N -  Managing your Medications? N -  Managing your Finances? N -  Housekeeping or managing your Housekeeping? N -  Some recent data might be hidden    Patient Care Team: Einar Pheasant, MD as PCP - General (Internal Medicine)    Assessment:   This is a routine wellness examination for [name].  The goal of the wellness visit is to assist the patient how to close the gaps in care and create a preventative care plan for the patient.   The roster of all physicians providing medical care to patient is listed in the Snapshot section of the chart.  Osteoporosis risk reviewed.    Safety issues reviewed; Life alert. Smoke and  carbon monoxide detectors in the home. No firearms in the home. Wears seatbelts when driving or riding with others. No violence in the home.  They do  not have excessive sun exposure.  Discussed the need for sun protection: hats, long sleeves and the use of sunscreen if there is significant sun exposure.  Patient is alert, normal appearance, oriented to person/place/and time.  Correctly identified the president of the [place] and recalls of 2/3 words. Performs simple calculations and can read correct time from watch face. Displays appropriate judgement.  No new identified risk were noted.  No failures at ADL's or IADL's.  Ambulates with walker as needed.  BMI- discussed the importance of a healthy diet, water intake and the benefits of aerobic exercise. Educational material provided.   24 hour diet recall: Regular diet  Dental- UTD  Eye- Visual acuity not assessed per patient preference since they have regular follow up with the ophthalmologist.  Wears corrective lenses.  Sleep patterns- Sleeps 6-8 hours at night.    Health maintenance gaps- closed.  Patient Concerns: None at this time. Follow up with PCP as needed.  Exercise Activities and Dietary recommendations Current Exercise Habits: Home exercise routine, Type of exercise: calisthenics(Row machine), Time (Minutes): 20, Frequency (Times/Week): 7, Weekly Exercise (Minutes/Week): 140, Intensity: Mild  Goals    . Maintain Healthy Lifestyle      Stay hydrated  Exercise  Healthy diet       Fall Risk Fall Risk  05/25/2017 10/19/2016 05/24/2016 10/29/2015 05/25/2015  Falls in the past year? Yes Yes Yes No Yes  Number falls in past yr: 1 1 1  - 2 or more  Injury with Fall? (No Data) No No - No  Comment She does not know why she fell. Tried to turn around, fell and bumped her head 1 month ago.  She didn't seek medical attention.  No headaches. No tender to touch places. One raised knot reported which quickly resolved itself. - - - -   Risk for fall due to : - - (No Data) - -  Risk for fall due to: Comment - - Leg weakness - -  Follow up Falls prevention discussed;Education provided Falls prevention discussed Falls prevention discussed;Education provided - -  Comment Use walker/cane when ambulating - - - -   Depression Screen PHQ 2/9 Scores 05/25/2017 05/24/2016 10/29/2015 05/25/2015  PHQ - 2 Score 0 0 0 0  PHQ- 9 Score - 0 - -     Cognitive Function     6CIT Screen 05/25/2017 05/24/2016  What Year? 0 points 0 points  What month? 0 points 0 points  What time? 0 points 0 points  Count back from 20 0 points 0 points  Months in reverse 0 points 0 points  Repeat phrase 0 points 0 points  Total Score 0 0    Immunization History  Administered Date(s) Administered  . Influenza Split 11/23/2011, 11/12/2012  . Influenza, High Dose Seasonal PF 10/29/2015, 10/23/2016  . Influenza,inj,Quad PF,6+ Mos 11/20/2013  . Pneumococcal Polysaccharide-23 10/23/2016   Screening Tests Health Maintenance  Topic Date Due  . DEXA SCAN  06/23/2017 (Originally 03/10/1994)  . INFLUENZA VACCINE  08/24/2017  . MAMMOGRAM  Discontinued  . TETANUS/TDAP  Discontinued  . PNA vac Low Risk Adult  Discontinued      Plan:    End of life planning; Advance aging; Advanced directives discussed. Copy of current HCPOA/Living Will requested.    I have personally reviewed and noted the following in the patient's chart:   . Medical and social history . Use of alcohol, tobacco or illicit drugs  . Current medications and supplements . Functional ability and status .  Nutritional status . Physical activity . Advanced directives . List of other physicians . Hospitalizations, surgeries, and ER visits in previous 12 months . Vitals . Screenings to include cognitive, depression, and falls . Referrals and appointments  In addition, I have reviewed and discussed with patient certain preventive protocols, quality metrics, and best practice recommendations. A  written personalized care plan for preventive services as well as general preventive health recommendations were provided to patient.     Varney Biles, LPN  01/24/8865   Reviewed above information.  Agree with assessment and plan.   Dr Nicki Reaper

## 2017-05-25 NOTE — Patient Instructions (Addendum)
  Rachel Martinez , Thank you for taking time to come for your Medicare Wellness Visit. I appreciate your ongoing commitment to your health goals. Please review the following plan we discussed and let me know if I can assist you in the future.   Follow as needed.    Bring a copy of your [place] and/or Living Will to be scanned into chart.  Have a great day!  These are the goals we discussed: Goals    . Maintain Healthy Lifestyle      Stay hydrated  Exercise  Healthy diet       This is a list of the screening recommended for you and due dates:  Health Maintenance  Topic Date Due  . DEXA scan (bone density measurement)  06/23/2017*  . Flu Shot  08/24/2017  . Mammogram  Discontinued  . Tetanus Vaccine  Discontinued  . Pneumonia vaccines  Discontinued  *Topic was postponed. The date shown is not the original due date.

## 2017-05-27 ENCOUNTER — Other Ambulatory Visit: Payer: Self-pay | Admitting: Internal Medicine

## 2017-07-02 ENCOUNTER — Other Ambulatory Visit: Payer: Self-pay | Admitting: Internal Medicine

## 2017-07-24 ENCOUNTER — Encounter: Payer: Self-pay | Admitting: Internal Medicine

## 2017-07-24 ENCOUNTER — Ambulatory Visit (INDEPENDENT_AMBULATORY_CARE_PROVIDER_SITE_OTHER): Payer: Medicare Other | Admitting: Internal Medicine

## 2017-07-24 VITALS — BP 132/68 | HR 67 | Temp 98.2°F | Resp 18 | Wt 112.8 lb

## 2017-07-24 DIAGNOSIS — Z8673 Personal history of transient ischemic attack (TIA), and cerebral infarction without residual deficits: Secondary | ICD-10-CM | POA: Diagnosis not present

## 2017-07-24 DIAGNOSIS — I10 Essential (primary) hypertension: Secondary | ICD-10-CM | POA: Diagnosis not present

## 2017-07-24 DIAGNOSIS — E7800 Pure hypercholesterolemia, unspecified: Secondary | ICD-10-CM

## 2017-07-24 DIAGNOSIS — F439 Reaction to severe stress, unspecified: Secondary | ICD-10-CM | POA: Diagnosis not present

## 2017-07-24 DIAGNOSIS — D509 Iron deficiency anemia, unspecified: Secondary | ICD-10-CM

## 2017-07-24 DIAGNOSIS — R634 Abnormal weight loss: Secondary | ICD-10-CM | POA: Diagnosis not present

## 2017-07-24 NOTE — Progress Notes (Signed)
Patient ID: Rachel Martinez, female   DOB: 1927/09/11, 84 y.o.   MRN: [id number]   Subjective:    Patient ID: Rachel Martinez, female    DOB: 08/13/1927, 84 y.o.   MRN: [id number]  HPI  Patient here for a scheduled follow up.  She reports increased fatigue.  Relates this to taking care of her husband.  She is the sole caretaker.  Increased stress related to this.  Discussed with her today.  She desires no further intervention and does not feel needs anything more at this point.  Discussed her weight loss.  She is eating.  No nausea or vomiting.  No abdominal pain.  Bowels moving.  Desires no further intervention or testing.     Past Medical History:  Diagnosis Date  . Anemia   . Chicken pox   . CVA (cerebral vascular accident) ([place]) 8/06  . Depression   . GERD (gastroesophageal reflux disease)   . Heart palpitations   . Hypercholesteremia   . Hypertension   . Stroke [facility])    Past Surgical History:  Procedure Laterality Date  . APPENDECTOMY     Family History  Problem Relation Age of Onset  . CVA Mother   . Diabetes Father   . Heart disease Father   . Cancer Sister        breast   . Diabetes Sister   . Cancer Brother        stomach  . Leukemia Brother   . Lung disease Brother   . Hypertension Sister   . Cancer Sister        stomach  . Diabetes Sister   . Mental illness Brother        suicide  . Breast cancer Paternal Aunt   . Diabetes Son    Social History   Socioeconomic History  . Marital status: Married    Spouse name: Not on file  . Number of children: Not on file  . Years of education: Not on file  . Highest education level: Not on file  Occupational History  . Not on file  Social Needs  . Financial resource strain: Not on file  . Food insecurity:    Worry: Never true    Inability: Never true  . Transportation needs:    Medical: No    Non-medical: No  Tobacco Use  . Smoking status: Former Smoker    Packs/day: 0.25    Years: 30.00    Pack years:  7.50    Last attempt to quit: 09/13/2003    Years since quitting: 13.8  . Smokeless tobacco: Never Used  Substance and Sexual Activity  . Alcohol use: No    Alcohol/week: 0.0 oz  . Drug use: No  . Sexual activity: Never  Lifestyle  . Physical activity:    Days per week: Not on file    Minutes per session: Not on file  . Stress: Not at all  Relationships  . Social connections:    Talks on phone: Not on file    Gets together: Not on file    Attends religious service: Not on file    Active member of club or organization: Not on file    Attends meetings of clubs or organizations: Not on file    Relationship status: Not on file  Other Topics Concern  . Not on file  Social History Narrative  . Not on file    Outpatient Encounter Medications as of 07/24/2017  Medication Sig  .  calcium carbonate (TUMS - DOSED IN MG ELEMENTAL CALCIUM) 500 MG chewable tablet Chew 1 tablet by mouth 3 (three) times daily as needed.   . clopidogrel (PLAVIX) 75 MG tablet take 1 tablet by mouth once daily  . diltiazem (CARDIZEM) 120 MG tablet Take 120 mg by mouth 4 (four) times daily.  Marland Kitchen diltiazem (TIAZAC) 120 MG 24 hr capsule take 1 capsule by mouth once daily  . Fe Fum-FePoly-Vit C-Vit B3 (INTEGRA) 62.5-62.5-40-3 MG CAPS Take 1 capsule by mouth daily.   . feeding supplement, ENSURE ENLIVE, (ENSURE ENLIVE) LIQD Take 237 mLs by mouth 2 (two) times daily between meals.  Marland Kitchen lisinopril (PRINIVIL,ZESTRIL) 10 MG tablet Take 1 tablet (10 mg total) by mouth daily.  . pantoprazole (PROTONIX) 40 MG tablet   . pravastatin (PRAVACHOL) 40 MG tablet take 1 tablet by mouth once daily  . sertraline (ZOLOFT) 50 MG tablet take 1 tablet by mouth once daily   No facility-administered encounter medications on file as of 07/24/2017.     Review of Systems  Constitutional: Negative for appetite change.       Weight down more as outlined.    HENT: Negative for congestion and sinus pressure.   Respiratory: Negative for cough,  chest tightness and shortness of breath.   Cardiovascular: Negative for chest pain, palpitations and leg swelling.  Gastrointestinal: Negative for abdominal pain, diarrhea, nausea and vomiting.  Genitourinary: Negative for difficulty urinating and dysuria.  Musculoskeletal: Negative for joint swelling and myalgias.  Skin: Negative for color change and rash.  Neurological: Negative for dizziness, light-headedness and headaches.  Psychiatric/Behavioral: Negative for agitation and dysphoric mood.       Objective:    Physical Exam  Constitutional: She appears well-developed and well-nourished. No distress.  HENT:  Nose: Nose normal.  Mouth/Throat: Oropharynx is clear and moist.  Neck: Neck supple. No thyromegaly present.  Cardiovascular: Normal rate and regular rhythm.  Pulmonary/Chest: Breath sounds normal. No respiratory distress. She has no wheezes.  Abdominal: Soft. Bowel sounds are normal. There is no tenderness.  Musculoskeletal: She exhibits no edema or tenderness.  Lymphadenopathy:    She has no cervical adenopathy.  Skin: No rash noted. No erythema.  Psychiatric: She has a normal mood and affect. Her behavior is normal.    BP 132/68 (BP Location: Left Arm, Patient Position: Sitting, Cuff Size: Normal)   Pulse 67   Temp 98.2 F (36.8 C) (Oral)   Resp 18   Wt 112 lb 12.8 oz (51.2 kg)   SpO2 98%   BMI 20.97 kg/m  Wt Readings from Last 3 Encounters:  07/24/17 112 lb 12.8 oz (51.2 kg)  05/25/17 115 lb (52.2 kg)  04/17/17 117 lb 12.8 oz (53.4 kg)     Lab Results  Component Value Date   WBC 5.2 07/24/2017   HGB 12.4 07/24/2017   HCT 37.1 07/24/2017   PLT 179.0 07/24/2017   GLUCOSE 85 07/24/2017   CHOL 164 08/30/2016   TRIG 93.0 08/30/2016   HDL 67.10 08/30/2016   LDLCALC 79 08/30/2016   ALT 7 07/24/2017   AST 22 07/24/2017   NA 141 07/24/2017   K 4.2 07/24/2017   CL 104 07/24/2017   CREATININE 0.91 07/24/2017   BUN 21 07/24/2017   CO2 30 07/24/2017   TSH  2.67 04/17/2017       Assessment & Plan:   Problem List Items Addressed This Visit    History of CVA (cerebrovascular accident)    Doing well on plavix.  Hypercholesterolemia    On pravastatin.  Low cholesterol diet and exercise.  Follow lipid panel and liver function tests.        Relevant Orders   Hepatic function panel (Completed)   Hypertension    Blood pressure under good control.  Continue same medication regimen.  Follow pressures.  Follow metabolic panel.        Relevant Orders   Basic metabolic panel (Completed)   Iron deficiency anemia - Primary    Has been followed by hematology.  Received iron infusion.  Follow cbc and ferritin.        Relevant Orders   Ferritin (Completed)   CBC with Differential/Platelet (Completed)   Stress    Increased stress as outlined.  Discussed with her today.  She declines any further intervention.  Follow.        Weight loss    Persistent.  Eating.  Discussed further w/up.  She declines.  Encourage increased po intake.  Nutritional supplements.  Follow.            Einar Pheasant, MD

## 2017-07-25 LAB — CBC WITH DIFFERENTIAL/PLATELET
BASOS ABS: 0 10*3/uL (ref 0.0–0.1)
BASOS PCT: 1 % (ref 0.0–3.0)
EOS ABS: 0.1 10*3/uL (ref 0.0–0.7)
Eosinophils Relative: 1.1 % (ref 0.0–5.0)
HEMATOCRIT: 37.1 % (ref 36.0–46.0)
HEMOGLOBIN: 12.4 g/dL (ref 12.0–15.0)
LYMPHS PCT: 24.8 % (ref 12.0–46.0)
Lymphs Abs: 1.3 10*3/uL (ref 0.7–4.0)
MCHC: 33.5 g/dL (ref 30.0–36.0)
MCV: 93.2 fl (ref 78.0–100.0)
MONO ABS: 0.3 10*3/uL (ref 0.1–1.0)
Monocytes Relative: 6.7 % (ref 3.0–12.0)
Neutro Abs: 3.4 10*3/uL (ref 1.4–7.7)
Neutrophils Relative %: 66.4 % (ref 43.0–77.0)
Platelets: 179 10*3/uL (ref 150.0–400.0)
RBC: 3.98 Mil/uL (ref 3.87–5.11)
RDW: 14.1 % (ref 11.5–15.5)
WBC: 5.2 10*3/uL (ref 4.0–10.5)

## 2017-07-25 LAB — BASIC METABOLIC PANEL
BUN: 21 mg/dL (ref 6–23)
CHLORIDE: 104 meq/L (ref 96–112)
CO2: 30 mEq/L (ref 19–32)
CREATININE: 0.91 mg/dL (ref 0.40–1.20)
Calcium: 9.3 mg/dL (ref 8.4–10.5)
GFR: 61.96 mL/min (ref >60.00)
GLUCOSE: 85 mg/dL (ref 70–99)
POTASSIUM: 4.2 meq/L (ref 3.5–5.1)
Sodium: 141 mEq/L (ref 135–145)

## 2017-07-25 LAB — HEPATIC FUNCTION PANEL
ALK PHOS: 68 U/L (ref 39–117)
ALT: 7 U/L (ref 0–35)
AST: 22 U/L (ref 0–37)
Albumin: 4.1 g/dL (ref 3.5–5.2)
BILIRUBIN TOTAL: 0.4 mg/dL (ref 0.2–1.2)
Bilirubin, Direct: 0.1 mg/dL (ref 0.0–0.3)
Total Protein: 6.2 g/dL (ref 6.0–8.3)

## 2017-07-25 LAB — FERRITIN: FERRITIN: 136.8 ng/mL (ref 10.0–291.0)

## 2017-07-30 ENCOUNTER — Encounter: Payer: Self-pay | Admitting: Internal Medicine

## 2017-07-30 DIAGNOSIS — R634 Abnormal weight loss: Secondary | ICD-10-CM | POA: Insufficient documentation

## 2017-07-30 NOTE — Assessment & Plan Note (Signed)
Increased stress as outlined.  Discussed with her today.  She declines any further intervention.  Follow.

## 2017-07-30 NOTE — Assessment & Plan Note (Signed)
Blood pressure under good control.  Continue same medication regimen.  Follow pressures.  Follow metabolic panel.   

## 2017-07-30 NOTE — Assessment & Plan Note (Signed)
Has been followed by hematology.  Received iron infusion.  Follow cbc and ferritin.   

## 2017-07-30 NOTE — Assessment & Plan Note (Signed)
Doing well on plavix.  

## 2017-07-30 NOTE — Assessment & Plan Note (Signed)
On pravastatin.  Low cholesterol diet and exercise.  Follow lipid panel and liver function tests.   

## 2017-07-30 NOTE — Assessment & Plan Note (Signed)
Persistent.  Eating.  Discussed further w/up.  She declines.  Encourage increased po intake.  Nutritional supplements.  Follow.

## 2017-09-27 ENCOUNTER — Ambulatory Visit: Payer: Medicare Other | Admitting: Internal Medicine

## 2017-12-08 DIAGNOSIS — Z23 Encounter for immunization: Secondary | ICD-10-CM | POA: Diagnosis not present

## 2018-02-16 ENCOUNTER — Other Ambulatory Visit: Payer: Self-pay | Admitting: Internal Medicine

## 2018-02-16 NOTE — Telephone Encounter (Signed)
Copied from [place] [id number]. Topic: Quick Communication - Rx Refill/Question >> Feb 16, 2018  2:05 PM Yvette Rack wrote: Medication: clopidogrel (PLAVIX) 75 MG tablet  Has the patient contacted their pharmacy? yes   Preferred Pharmacy (with phone number or street name): [place], [place], [place] [phone] (Phone)  [phone] (Fax)  Agent: Please be advised that RX refills may take up to 3 business days. We ask that you follow-up with your pharmacy.

## 2018-02-16 NOTE — Telephone Encounter (Signed)
Requested medication (s) are due for refill today: yes  Requested medication (s) are on the active medication list: yes  Last refill: 12/23/16  Expired RX  Future visit scheduled: yes  Notes to clinic:  Expired RX    Requested Prescriptions  Pending Prescriptions Disp Refills   clopidogrel (PLAVIX) 75 MG tablet 30 tablet 5    Sig: Take 1 tablet (75 mg total) by mouth daily.     Hematology: Antiplatelets - clopidogrel Failed - 02/16/2018  2:34 PM      Failed - Evaluate AST, ALT within 2 months of therapy initiation.      Failed - HCT in normal range and within 180 days    HCT  Date Value Ref Range Status  07/24/2017 37.1 36.0 - 46.0 % Final  10/11/2013 38.9 35.0 - 47.0 % Final         Failed - HGB in normal range and within 180 days    Hemoglobin  Date Value Ref Range Status  07/24/2017 12.4 12.0 - 15.0 g/dL Final   HGB  Date Value Ref Range Status  10/11/2013 12.9 12.0 - 16.0 g/dL Final         Failed - PLT in normal range and within 180 days    Platelets  Date Value Ref Range Status  07/24/2017 179.0 150.0 - 400.0 K/uL Final   Platelet  Date Value Ref Range Status  10/11/2013 180 150 - 440 x10 3/mm  Final         Failed - Valid encounter within last 6 months    Recent Outpatient Visits          6 months ago Iron deficiency anemia, unspecified iron deficiency anemia type   [facility] [place], Randell Patient, MD   10 months ago Hypercholesterolemia   [facility] [place], Randell Patient, MD   1 year ago Stress   [facility] [place], Randell Patient, MD   1 year ago Essential hypertension   [facility] [place], Randell Patient, MD   2 years ago Essential hypertension   [place], MD      Future Appointments            In 3 months Einar Pheasant, MD [place], [place]   In 3 months O'Brien-Blaney, Denisa L, LPN [place], [place] - ALT in normal range and within 360 days    ALT  Date Value Ref Range Status  07/24/2017 7 0 - 35 U/L Final         Passed - AST in normal range and within 360 days    AST  Date Value Ref Range Status  07/24/2017 22 0 - 37 U/L Final

## 2018-02-16 NOTE — Telephone Encounter (Unsigned)
Copied from [place] [id number]. Topic: Quick Communication - Rx Refill/Question >> Feb 16, 2018  2:05 PM Yvette Rack wrote: Medication: clopidogrel (PLAVIX) 75 MG tablet  Has the patient contacted their pharmacy? yes   Preferred Pharmacy (with phone number or street name): [place], [place], [place] [phone] (Phone)  [phone] (Fax)  Agent: Please be advised that RX refills may take up to 3 business days. We ask that you follow-up with your pharmacy.

## 2018-03-05 ENCOUNTER — Ambulatory Visit: Payer: Medicare Other | Admitting: Family Medicine

## 2018-03-12 DIAGNOSIS — M7581 Other shoulder lesions, right shoulder: Secondary | ICD-10-CM | POA: Diagnosis not present

## 2018-03-12 DIAGNOSIS — M25511 Pain in right shoulder: Secondary | ICD-10-CM | POA: Diagnosis not present

## 2018-05-29 ENCOUNTER — Ambulatory Visit (INDEPENDENT_AMBULATORY_CARE_PROVIDER_SITE_OTHER): Payer: Medicare Other

## 2018-05-29 ENCOUNTER — Other Ambulatory Visit: Payer: Self-pay

## 2018-05-29 ENCOUNTER — Encounter: Payer: Self-pay | Admitting: Internal Medicine

## 2018-05-29 ENCOUNTER — Ambulatory Visit (INDEPENDENT_AMBULATORY_CARE_PROVIDER_SITE_OTHER): Payer: Medicare Other | Admitting: Internal Medicine

## 2018-05-29 DIAGNOSIS — Z8673 Personal history of transient ischemic attack (TIA), and cerebral infarction without residual deficits: Secondary | ICD-10-CM | POA: Diagnosis not present

## 2018-05-29 DIAGNOSIS — F439 Reaction to severe stress, unspecified: Secondary | ICD-10-CM | POA: Diagnosis not present

## 2018-05-29 DIAGNOSIS — E7800 Pure hypercholesterolemia, unspecified: Secondary | ICD-10-CM

## 2018-05-29 DIAGNOSIS — D509 Iron deficiency anemia, unspecified: Secondary | ICD-10-CM | POA: Diagnosis not present

## 2018-05-29 DIAGNOSIS — I10 Essential (primary) hypertension: Secondary | ICD-10-CM

## 2018-05-29 DIAGNOSIS — N289 Disorder of kidney and ureter, unspecified: Secondary | ICD-10-CM | POA: Diagnosis not present

## 2018-05-29 DIAGNOSIS — M25511 Pain in right shoulder: Secondary | ICD-10-CM | POA: Diagnosis not present

## 2018-05-29 DIAGNOSIS — R634 Abnormal weight loss: Secondary | ICD-10-CM

## 2018-05-29 DIAGNOSIS — Z0000 Encounter for general adult medical examination without abnormal findings: Secondary | ICD-10-CM

## 2018-05-29 NOTE — Patient Instructions (Addendum)
  Rachel Martinez , Thank you for taking time to come for your Medicare Wellness Visit. I appreciate your ongoing commitment to your health goals. Please review the following plan we discussed and let me know if I can assist you in the future.   These are the goals we discussed: Goals      Patient Stated   . Healthy Lifestyle  (pt-stated)     Drink plenty of fluids Eat 2 good meals daily Stay active       This is a list of the screening recommended for you and due dates:  Health Maintenance  Topic Date Due  . DEXA scan (bone density measurement)  03/10/1994  . Flu Shot  08/25/2018  . Mammogram  Discontinued  . Tetanus Vaccine  Discontinued  . Pneumonia vaccines  Discontinued

## 2018-05-29 NOTE — Progress Notes (Signed)
Subjective:   Rachel Martinez is a 85 y.o. female who presents for Medicare Annual (Subsequent) preventive examination.  Review of Systems:  No ROS.  Medicare Wellness Virtual Visit.  Visual/audio telehealth visit, UTA vital signs.   See social history for additional risk factors.   Cardiac Risk Factors include: advanced age (>61men, >23 women);hypertension     Objective:     Vitals: There were no vitals taken for this visit.  There is no height or weight on file to calculate BMI.  Advanced Directives 05/29/2018 05/25/2017 10/22/2016 09/28/2016 09/12/2016 05/24/2016  Does Patient Have a Medical Advance Directive? Yes No No Yes No No  Type of Advance Directive Healthcare Power of [place]  Does patient want to make changes to medical advance directive? No - Patient declined - - - - -  Copy of [place] in Chart? No - copy requested - - - - -  Would patient like information on creating a medical advance directive? - No - Patient declined No - Patient declined - - Yes (MAU/Ambulatory/Procedural Areas - Information given)    Tobacco Social History   Tobacco Use  Smoking Status Former Smoker  . Packs/day: 0.25  . Years: 30.00  . Pack years: 7.50  . Last attempt to quit: 09/13/2003  . Years since quitting: 14.7  Smokeless Tobacco Never Used     Counseling given: Not Answered   Clinical Intake:  Pre-visit preparation completed: Yes        Diabetes: No  How often do you need to have someone help you when you read instructions, pamphlets, or other written materials from your doctor or pharmacy?: 3 - Sometimes  Interpreter Needed?: No     Past Medical History:  Diagnosis Date  . Anemia   . Chicken pox   . CVA (cerebral vascular accident) ([place]) 8/06  . Depression   . GERD (gastroesophageal reflux disease)   . Heart palpitations   . Hypercholesteremia   . Hypertension   . Stroke [facility])    Past Surgical History:   Procedure Laterality Date  . APPENDECTOMY     Family History  Problem Relation Age of Onset  . CVA Mother   . Diabetes Father   . Heart disease Father   . Cancer Sister        breast   . Diabetes Sister   . Cancer Brother        stomach  . Leukemia Brother   . Lung disease Brother   . Hypertension Sister   . Cancer Sister        stomach  . Diabetes Sister   . Mental illness Brother        suicide  . Breast cancer Paternal Aunt   . Diabetes Son    Social History   Socioeconomic History  . Marital status: Married    Spouse name: Not on file  . Number of children: Not on file  . Years of education: Not on file  . Highest education level: Not on file  Occupational History  . Not on file  Social Needs  . Financial resource strain: Not hard at all  . Food insecurity:    Worry: Never true    Inability: Never true  . Transportation needs:    Medical: No    Non-medical: No  Tobacco Use  . Smoking status: Former Smoker    Packs/day: 0.25    Years: 30.00  Pack years: 7.50    Last attempt to quit: 09/13/2003    Years since quitting: 14.7  . Smokeless tobacco: Never Used  Substance and Sexual Activity  . Alcohol use: No    Alcohol/week: 0.0 standard drinks  . Drug use: No  . Sexual activity: Never  Lifestyle  . Physical activity:    Days per week: 2 days    Minutes per session: 10 min  . Stress: Not at all  Relationships  . Social connections:    Talks on phone: Not on file    Gets together: Not on file    Attends religious service: Not on file    Active member of club or organization: Not on file    Attends meetings of clubs or organizations: Not on file    Relationship status: Not on file  Other Topics Concern  . Not on file  Social History Narrative  . Not on file    Outpatient Encounter Medications as of 05/29/2018  Medication Sig  . calcium carbonate (TUMS - DOSED IN MG ELEMENTAL CALCIUM) 500 MG chewable tablet Chew 1 tablet by mouth 3 (three) times  daily as needed.   . clopidogrel (PLAVIX) 75 MG tablet take 1 tablet by mouth once daily  . diltiazem (CARDIZEM CD) 120 MG 24 hr capsule Take 120 mg by mouth daily.  . feeding supplement, ENSURE ENLIVE, (ENSURE ENLIVE) LIQD Take 237 mLs by mouth 2 (two) times daily between meals.  Marland Kitchen lisinopril (PRINIVIL,ZESTRIL) 10 MG tablet Take 1 tablet (10 mg total) by mouth daily.  . pantoprazole (PROTONIX) 40 MG tablet   . pravastatin (PRAVACHOL) 40 MG tablet take 1 tablet by mouth once daily  . sertraline (ZOLOFT) 50 MG tablet take 1 tablet by mouth once daily   No facility-administered encounter medications on file as of 05/29/2018.     Activities of Daily Living In your present state of health, do you have any difficulty performing the following activities: 05/29/2018  Hearing? N  Vision? N  Difficulty concentrating or making decisions? N  Walking or climbing stairs? Y  Comment Avoids the stairs  Dressing or bathing? N  Doing errands, shopping? Y  Comment She does not Physiological scientist and eating ? N  Using the Toilet? N  In the past six months, have you accidently leaked urine? Y  Comment Managed with daily brief  Do you have problems with loss of bowel control? N  Managing your Medications? N  Managing your Finances? Y  Comment Her son assists  Housekeeping or managing your Housekeeping? N  Comment She paces herself  Some recent data might be hidden    Patient Care Team: Einar Pheasant, MD as PCP - General (Internal Medicine)    Assessment:   This is a routine wellness examination for Rachel Martinez.  I connected with patient 05/29/18 at 4:30 by a video/audio enabled telemedicine application and verified that I am speaking with the correct person using two identifiers. Patient stated full name and DOB. Patient gave permission to continue with virtual visit. Patient's location was at home and Nurse's location was at [place] office.   Health Screenings  Mammogram - 04/2014 Colonoscopy -  04/2013 Glaucoma -none Hearing -demonstrates normal hearing during visit. TSH- 03/2017 Dental- UTD Vision- annual visits  Social  Alcohol intake - no        Smoking history- former Smokers in home? none Illicit drug use? none Exercise - chair exercises Diet - regular Sexually Active -never BMI- discussed the  importance of a healthy diet, water intake and the benefits of aerobic exercise.  Educational material provided.   Safety  Patient feels safe at home- yes Patient does have smoke detectors at home- yes Patient does wear sunscreen or protective clothing when in direct sunlight -yes Patient does wear seat belt when in a moving vehicle -yes  Activities of Daily Living Patient paces herself when doing her own household chores. Denies needing assistance with: feeding themselves, getting from bed to chair, getting to the toilet, bathing/showering, dressing, or preparing meals.  No new identified risk were noted.    Depression Screen Patient denies losing interest in daily life, feeling hopeless, or crying easily over simple problems.   Medication-taking as directed and without issues.   Fall Screen Patient denies being afraid of falling or falling in the last year.   Memory Screen Patient denies problems with memory, misplacing items. Patient is alert. Correctly identified the president of the [place] and recall of 2/3 objects. Patient likes to read, plays computer games, and/or work puzzles for brain stimulation.  Immunizations The following Immunizations were discussed: Influenza, shingles, pneumonia, and tetanus.   Other Providers Patient Care Team: Einar Pheasant, MD as PCP - General (Internal Medicine)  Exercise Activities and Dietary recommendations Current Exercise Habits: Home exercise routine, Type of exercise: stretching;walking, Time (Minutes): 15, Frequency (Times/Week): 2, Weekly Exercise (Minutes/Week): 30, Intensity: Mild  Goals      Patient Stated   .  Healthy Lifestyle  (pt-stated)     Drink plenty of fluids Eat 2 good meals daily Stay active       Fall Risk Fall Risk  05/29/2018 05/25/2017 10/19/2016 05/24/2016 10/29/2015  Falls in the past year? 0 Yes Yes Yes No  Number falls in past yr: - 1 1 1  -  Injury with Fall? - (No Data) No No -  Comment - She does not know why she fell. Tried to turn around, fell and bumped her head 1 month ago.  She didn't seek medical attention.  No headaches. No tender to touch places. One raised knot reported which quickly resolved itself. - - -  Risk for fall due to : - - - (No Data) -  Risk for fall due to: Comment - - - Leg weakness -  Follow up - Falls prevention discussed;Education provided Falls prevention discussed Falls prevention discussed;Education provided -  Comment - Use walker/cane when ambulating - - -   Depression Screen PHQ 2/9 Scores 05/29/2018 05/25/2017 05/24/2016 10/29/2015  PHQ - 2 Score 0 0 0 0  PHQ- 9 Score - - 0 -     Cognitive Function     6CIT Screen 05/29/2018 05/25/2017 05/24/2016  What Year? 0 points 0 points 0 points  What month? 0 points 0 points 0 points  What time? 0 points 0 points 0 points  Count back from 20 0 points 0 points 0 points  Months in reverse 0 points 0 points 0 points  Repeat phrase 0 points 0 points 0 points  Total Score 0 0 0    Immunization History  Administered Date(s) Administered  . Influenza Split 11/23/2011, 11/12/2012  . Influenza, High Dose Seasonal PF 10/29/2015, 10/23/2016  . Influenza,inj,Quad PF,6+ Mos 11/20/2013  . Pneumococcal Polysaccharide-23 10/23/2016   Screening Tests Health Maintenance  Topic Date Due  . DEXA SCAN  03/10/1994  . INFLUENZA VACCINE  08/25/2018  . MAMMOGRAM  Discontinued  . TETANUS/TDAP  Discontinued  . PNA vac Low Risk Adult  Discontinued  Plan:    End of life planning; Advance aging; Advanced directives discussed.  Copy of current HCPOA/Living Will requested.    I have personally reviewed and noted the  following in the patient's chart:   . Medical and social history . Use of alcohol, tobacco or illicit drugs  . Current medications and supplements . Functional ability and status . Nutritional status . Physical activity . Advanced directives . List of other physicians . Hospitalizations, surgeries, and ER visits in previous 12 months . Vitals . Screenings to include cognitive, depression, and falls . Referrals and appointments  In addition, I have reviewed and discussed with patient certain preventive protocols, quality metrics, and best practice recommendations. A written personalized care plan for preventive services as well as general preventive health recommendations were provided to patient.     Varney Biles, LPN  03/03/4130   Reviewed above information.  Agree with assessment and plan.    Dr Nicki Reaper

## 2018-05-29 NOTE — Progress Notes (Signed)
Patient ID: Rachel Martinez, female   DOB: 12-13-1927, 85 y.o.   MRN: [id number]   Virtual Visit via Telephone Note  This visit type was conducted due to national recommendations for restrictions regarding the COVID-19 pandemic (e.g. social distancing).  This format is felt to be most appropriate for this patient at this time.  All issues noted in this document were discussed and addressed.  No physical exam was performed (except for noted visual exam findings with Video Visits).   I connected with Rachel Martinez by telephone and verified that I am speaking with the correct person using two identifiers. Location patient: home Location provider: work Persons participating in the telephone visit: patient, provider  I discussed the limitations, risks, security and privacy concerns of performing an evaluation and management service by telephone and the availability of in person appointments. The patient expressed understanding and agreed to proceed.   Reason for visit: scheduled follow up.    HPI: She reports she is doing relatively well.  Some increased stress related to her husband's death.  Also her son has cancer.  Discussed with her today.  She has good support.  Does not feel she needs anything more at this time.  Staying in due to COVID restrictions.  No fever. No cough or chest congestion.  No sob.  No acid reflux.  No abdominal pain.  Bowels moving.  Has a history of CVA.  On plavix and doing well.  Had right shoulder pain.  S/p previous injection.  Request referral back to ortho for reevaluation of her shoulder.  She is using her walker.     ROS: See pertinent positives and negatives per HPI.  Past Medical History:  Diagnosis Date  . Anemia   . Chicken pox   . CVA (cerebral vascular accident) ([place]) 8/06  . Depression   . GERD (gastroesophageal reflux disease)   . Heart palpitations   . Hypercholesteremia   . Hypertension   . Stroke [facility])     Past Surgical History:  Procedure  Laterality Date  . APPENDECTOMY      Family History  Problem Relation Age of Onset  . CVA Mother   . Diabetes Father   . Heart disease Father   . Cancer Sister        breast   . Diabetes Sister   . Cancer Brother        stomach  . Leukemia Brother   . Lung disease Brother   . Hypertension Sister   . Cancer Sister        stomach  . Diabetes Sister   . Mental illness Brother        suicide  . Breast cancer Paternal Aunt   . Diabetes Son     SOCIAL HX: reviewed.    Current Outpatient Medications:  .  calcium carbonate (TUMS - DOSED IN MG ELEMENTAL CALCIUM) 500 MG chewable tablet, Chew 1 tablet by mouth 3 (three) times daily as needed. , Disp: , Rfl:  .  clopidogrel (PLAVIX) 75 MG tablet, take 1 tablet by mouth once daily, Disp: 30 tablet, Rfl: 5 .  diltiazem (CARDIZEM CD) 120 MG 24 hr capsule, Take 120 mg by mouth daily., Disp: , Rfl:  .  feeding supplement, ENSURE ENLIVE, (ENSURE ENLIVE) LIQD, Take 237 mLs by mouth 2 (two) times daily between meals., Disp: 237 mL, Rfl: 12 .  lisinopril (PRINIVIL,ZESTRIL) 10 MG tablet, Take 1 tablet (10 mg total) by mouth daily., Disp: 30 tablet, Rfl: 5 .  pantoprazole (PROTONIX) 40 MG tablet, , Disp: , Rfl: 0 .  pravastatin (PRAVACHOL) 40 MG tablet, take 1 tablet by mouth once daily, Disp: 30 tablet, Rfl: 5 .  sertraline (ZOLOFT) 50 MG tablet, take 1 tablet by mouth once daily, Disp: 30 tablet, Rfl: 2  EXAM:  VITALS per patient if applicable: weight 185 pounds  GENERAL: alert.  Answering question appropriately.  Sounds to be in no acute distress.    PSYCH/NEURO: pleasant and cooperative, no obvious depression or anxiety, speech and thought processing grossly intact  ASSESSMENT AND PLAN:  Discussed the following assessment and plan:  History of CVA (cerebrovascular accident)  Hypercholesterolemia - Plan: Hepatic function panel, Lipid panel  Essential hypertension - Plan: TSH, Basic metabolic panel  Iron deficiency anemia,  unspecified iron deficiency anemia type - Plan: CBC with Differential/Platelet, Ferritin  Renal insufficiency  Right shoulder pain, unspecified chronicity - Plan: Ambulatory referral to Orthopedic Surgery  Stress  Weight loss  History of CVA (cerebrovascular accident) Doing well on plavix.    Hypercholesterolemia On pravastatin.  Follow lipid panel and liver function tests.    Hypertension Blood pressure has been under good control.  Continue current medications.  Follow pressures.  Follow metabolicpanel.    Iron deficiency anemia Has been followed by hematology.  Received iron infusion.  Follow cbc and ferritin.    Renal insufficiency Stay hydrated.  Follow metabolic panel.   Right shoulder pain Previous injection.  Request referral back to ortho.    Stress Increased stress with her husband's death and her son's diagnosis of cancer.  Discussed with her today.  She has good support.  Does not feel she needs anything more at this time.  Follow.    Weight loss Weight 112 pounds per pt.  Eating.  States she has a good appetite.  Desires no further testing.  Follow.      I discussed the assessment and treatment plan with the patient. The patient was provided an opportunity to ask questions and all were answered. The patient agreed with the plan and demonstrated an understanding of the instructions.   The patient was advised to call back or seek an in-person evaluation if the symptoms worsen or if the condition fails to improve as anticipated.  I provided 16 minutes of non-face-to-face time during this encounter.   Einar Pheasant, MD

## 2018-06-02 ENCOUNTER — Encounter: Payer: Self-pay | Admitting: Internal Medicine

## 2018-06-02 NOTE — Assessment & Plan Note (Signed)
Stay hydrated.  Follow metabolic panel.  

## 2018-06-02 NOTE — Assessment & Plan Note (Signed)
On pravastatin.  Follow lipid panel and liver function tests.   

## 2018-06-02 NOTE — Assessment & Plan Note (Signed)
Weight 112 pounds per pt.  Eating.  States she has a good appetite.  Desires no further testing.  Follow.

## 2018-06-02 NOTE — Assessment & Plan Note (Signed)
Increased stress with her husband's death and her son's diagnosis of cancer.  Discussed with her today.  She has good support.  Does not feel she needs anything more at this time.  Follow.

## 2018-06-02 NOTE — Assessment & Plan Note (Signed)
Previous injection.  Request referral back to ortho.

## 2018-06-02 NOTE — Assessment & Plan Note (Signed)
Blood pressure has been under good control.  Continue current medications.  Follow pressures.  Follow metabolicpanel.

## 2018-06-02 NOTE — Assessment & Plan Note (Signed)
Has been followed by hematology.  Received iron infusion.  Follow cbc and ferritin.

## 2018-06-02 NOTE — Assessment & Plan Note (Signed)
Doing well on plavix.

## 2018-06-13 DIAGNOSIS — M7581 Other shoulder lesions, right shoulder: Secondary | ICD-10-CM | POA: Diagnosis not present

## 2018-07-18 ENCOUNTER — Other Ambulatory Visit: Payer: Self-pay

## 2018-07-18 ENCOUNTER — Emergency Department: Payer: Medicare Other

## 2018-07-18 ENCOUNTER — Emergency Department
Admission: EM | Admit: 2018-07-18 | Discharge: 2018-07-18 | Disposition: A | Discharge status: Home / Self Care | Payer: Medicare Other | Attending: Emergency Medicine | Admitting: Emergency Medicine

## 2018-07-18 DIAGNOSIS — I10 Essential (primary) hypertension: Secondary | ICD-10-CM | POA: Insufficient documentation

## 2018-07-18 DIAGNOSIS — W0110XA Fall on same level from slipping, tripping and stumbling with subsequent striking against unspecified object, initial encounter: Secondary | ICD-10-CM | POA: Insufficient documentation

## 2018-07-18 DIAGNOSIS — W19XXXA Unspecified fall, initial encounter: Secondary | ICD-10-CM

## 2018-07-18 DIAGNOSIS — Z7902 Long term (current) use of antithrombotics/antiplatelets: Secondary | ICD-10-CM | POA: Diagnosis not present

## 2018-07-18 DIAGNOSIS — S199XXA Unspecified injury of neck, initial encounter: Secondary | ICD-10-CM | POA: Diagnosis not present

## 2018-07-18 DIAGNOSIS — S0990XA Unspecified injury of head, initial encounter: Secondary | ICD-10-CM | POA: Diagnosis not present

## 2018-07-18 DIAGNOSIS — S0083XA Contusion of other part of head, initial encounter: Secondary | ICD-10-CM | POA: Diagnosis not present

## 2018-07-18 DIAGNOSIS — Y9389 Activity, other specified: Secondary | ICD-10-CM | POA: Diagnosis not present

## 2018-07-18 DIAGNOSIS — Y998 Other external cause status: Secondary | ICD-10-CM | POA: Diagnosis not present

## 2018-07-18 DIAGNOSIS — Y92019 Unspecified place in single-family (private) house as the place of occurrence of the external cause: Secondary | ICD-10-CM | POA: Diagnosis not present

## 2018-07-18 DIAGNOSIS — T148XXA Other injury of unspecified body region, initial encounter: Secondary | ICD-10-CM

## 2018-07-18 DIAGNOSIS — Z87891 Personal history of nicotine dependence: Secondary | ICD-10-CM | POA: Insufficient documentation

## 2018-07-18 DIAGNOSIS — R404 Transient alteration of awareness: Secondary | ICD-10-CM | POA: Diagnosis not present

## 2018-07-18 DIAGNOSIS — Z8673 Personal history of transient ischemic attack (TIA), and cerebral infarction without residual deficits: Secondary | ICD-10-CM | POA: Insufficient documentation

## 2018-07-18 DIAGNOSIS — S0003XA Contusion of scalp, initial encounter: Secondary | ICD-10-CM | POA: Insufficient documentation

## 2018-07-18 DIAGNOSIS — Z79899 Other long term (current) drug therapy: Secondary | ICD-10-CM | POA: Diagnosis not present

## 2018-07-18 LAB — URINALYSIS, COMPLETE (UACMP) WITH MICROSCOPIC
Bacteria, UA: NONE SEEN
Bilirubin Urine: NEGATIVE
Glucose, UA: NEGATIVE mg/dL
Hgb urine dipstick: NEGATIVE
Ketones, ur: NEGATIVE mg/dL
Leukocytes,Ua: NEGATIVE
Nitrite: NEGATIVE
Protein, ur: NEGATIVE mg/dL
Specific Gravity, Urine: 1.014 (ref 1.005–1.030)
Squamous Epithelial / LPF: NONE SEEN (ref 0–5)
pH: 5 (ref 5.0–8.0)

## 2018-07-18 NOTE — ED Triage Notes (Signed)
Pt to ED via EMS from home. Per ems pt had mechanical fall last night and had skin abrasion to left elbow, dressing in place. Pt had mechanical fall again this evening and hit head on floor, pt has hematoma to back right of head. Pt is alert, only oriented to person and place. Pt denies loc, pt unsure if she takes blood thinners. Pt ambulates with walker.

## 2018-07-18 NOTE — ED Notes (Signed)
Pt back from CT

## 2018-07-18 NOTE — ED Notes (Signed)
Patient transported to CT 

## 2018-07-18 NOTE — Discharge Instructions (Addendum)
Please seek medical attention for any high fevers, chest pain, shortness of breath, change in behavior, persistent vomiting, bloody stool or any other new or concerning symptoms.  

## 2018-07-18 NOTE — ED Notes (Addendum)
Patient Oriented to baseline. Vials stable. NAD. Discharge instructions given to on Otelia Sergeant

## 2018-07-18 NOTE — ED Provider Notes (Signed)
[facility] Emergency Department Provider Note  ____________________________________________   I have reviewed the triage vital signs and the nursing notes.   HISTORY  Chief Complaint Fall  History limited by: Not Limited   HPI Rachel Martinez is a 85 y.o. female who presents to the emergency department today because of concerns for a fall.  Patient apparently was walking and was not using her walker when she fell.  She does complain of some swelling to the back of her head but denies any significant pain.  She denies any shortness of breath or chest pain.  She denies any recent fevers.   Records reviewed. Per medical record review patient has a history of HTN, CVA  Past Medical History:  Diagnosis Date  . Anemia   . Chicken pox   . CVA (cerebral vascular accident) ([place]) 8/06  . Depression   . GERD (gastroesophageal reflux disease)   . Heart palpitations   . Hypercholesteremia   . Hypertension   . Stroke [facility]     Patient Active Problem List   Diagnosis Date Noted  . Weight loss 07/30/2017  . Altered mental state 10/22/2016  . Health care maintenance 05/25/2015  . Fall 06/29/2014  . Gastric ulcer 05/22/2013  . Abdominal bruit 05/10/2013  . Breast nodule 05/10/2013  . Heme positive stool 05/10/2013  . Light headedness 05/10/2013  . Right shoulder pain 12/16/2012  . Stress 12/16/2012  . Renal insufficiency 01/11/2012  . Iron deficiency anemia 01/11/2012  . History of CVA (cerebrovascular accident) 01/10/2012  . Hypertension 01/10/2012  . Hypercholesterolemia 01/10/2012    Past Surgical History:  Procedure Laterality Date  . APPENDECTOMY      Prior to Admission medications   Medication Sig Start Date End Date Taking? Authorizing Provider  calcium carbonate (TUMS - DOSED IN MG ELEMENTAL CALCIUM) 500 MG chewable tablet Chew 1 tablet by mouth 3 (three) times daily as needed.     [provider]  clopidogrel (PLAVIX) 75 MG tablet  take 1 tablet by mouth once daily 12/23/16   Einar Pheasant, MD  diltiazem (CARDIZEM CD) 120 MG 24 hr capsule Take 120 mg by mouth daily. 02/16/18   [provider]  feeding supplement, ENSURE ENLIVE, (ENSURE ENLIVE) LIQD Take 237 mLs by mouth 2 (two) times daily between meals. 10/24/16   Fritzi Mandes, MD  lisinopril (PRINIVIL,ZESTRIL) 10 MG tablet Take 1 tablet (10 mg total) by mouth daily. 07/10/12   Einar Pheasant, MD  pantoprazole (PROTONIX) 40 MG tablet  04/09/17   [provider]  pravastatin (PRAVACHOL) 40 MG tablet take 1 tablet by mouth once daily 05/29/17   Einar Pheasant, MD  sertraline (ZOLOFT) 50 MG tablet take 1 tablet by mouth once daily 05/29/17   Einar Pheasant, MD    Allergies Amoxicillin-pot clavulanate, Ciprofloxacin, Codeine sulfate, Imipenem, and Metronidazole  Family History  Problem Relation Age of Onset  . CVA Mother   . Diabetes Father   . Heart disease Father   . Cancer Sister        breast   . Diabetes Sister   . Cancer Brother        stomach  . Leukemia Brother   . Lung disease Brother   . Hypertension Sister   . Cancer Sister        stomach  . Diabetes Sister   . Mental illness Brother        suicide  . Breast cancer Paternal Aunt   . Diabetes Son  Social History Social History   Tobacco Use  . Smoking status: Former Smoker    Packs/day: 0.25    Years: 30.00    Pack years: 7.50    Quit date: 09/13/2003    Years since quitting: 14.8  . Smokeless tobacco: Never Used  Substance Use Topics  . Alcohol use: No    Alcohol/week: 0.0 standard drinks  . Drug use: No    Review of Systems Constitutional: No fever/chills Eyes: No visual changes. ENT: No sore throat. Cardiovascular: Denies chest pain. Respiratory: Denies shortness of breath. Gastrointestinal: No abdominal pain.  No nausea, no vomiting.  No diarrhea.   Genitourinary: Negative for dysuria. Musculoskeletal: Negative for back pain. Skin: Positive for hematoma.   Neurological: Negative for headaches, focal weakness or numbness.  ____________________________________________   PHYSICAL EXAM:  VITAL SIGNS: ED Triage Vitals  Enc Vitals Group     BP 07/18/18 2002 (!) 171/68     Pulse Rate 07/18/18 2002 (!) 59     Resp 07/18/18 2002 16     Temp 07/18/18 2002 98.1 F (36.7 C)     Temp Source 07/18/18 2002 Oral     SpO2 07/18/18 2002 97 %     Weight 07/18/18 1955 130 lb (59 kg)     Height 07/18/18 1955 5\' 5"  (1.651 m)     Head Circumference --      Peak Flow --      Pain Score 07/18/18 1954 0   Constitutional: Awake and alert.  Eyes: Conjunctivae are normal.  ENT      Head: Normocephalic. Hematoma to right occiput.       Nose: No congestion/rhinnorhea.      Mouth/Throat: Mucous membranes are moist.      Neck: No stridor. No midline tenderness.  Hematological/Lymphatic/Immunilogical: No cervical lymphadenopathy. Cardiovascular: Normal rate, regular rhythm.  No murmurs, rubs, or gallops.  Respiratory: Normal respiratory effort without tachypnea nor retractions. Breath sounds are clear and equal bilaterally. No wheezes/rales/rhonchi. Gastrointestinal: Soft and non tender. No rebound. No guarding.  Genitourinary: Deferred Musculoskeletal: Normal range of motion in all extremities. No lower extremity edema. No spinal tenderness. No tenderness to manipulation or palpation of bilateral thighs.  Neurologic:  Awake and alert. Moving all extremities. Skin:  Skin is warm, dry and intact. No rash noted. Psychiatric: Mood and affect are normal. Speech and behavior are normal. Patient exhibits appropriate insight and judgment.  ____________________________________________    LABS (pertinent positives/negatives)  UA hazy, leukocytes, negative, nitrite negative, 0-5 wbc  ____________________________________________   EKG  None  ____________________________________________    RADIOLOGY  CT head/cervical spine No acute traumatic  findings  ____________________________________________   PROCEDURES  Procedures  ____________________________________________   INITIAL IMPRESSION / ASSESSMENT AND PLAN / ED COURSE  Pertinent labs & imaging results that were available during my care of the patient were reviewed by me and considered in my medical decision making (see chart for details).   Patient presented to the emergency department today after a fall.  Patient does have her hematoma to the back of her head.  CT head and cervical spine were negative for acute traumatic finding.  UA was checked without concerning findings for urinary tract infection.  ____________________________________________   FINAL CLINICAL IMPRESSION(S) / ED DIAGNOSES  Final diagnoses:  Fall, initial encounter  Hematoma     Note: This dictation was prepared with Dragon dictation. Any transcriptional errors that result from this process are unintentional     Nance Pear, MD 07/18/18 2307

## 2018-07-18 NOTE — ED Notes (Signed)
Applied purewick external catheter to pt. Pt tolerated well.

## 2018-07-24 ENCOUNTER — Encounter: Payer: Self-pay | Admitting: Internal Medicine

## 2018-07-24 ENCOUNTER — Ambulatory Visit (INDEPENDENT_AMBULATORY_CARE_PROVIDER_SITE_OTHER): Payer: Medicare Other | Admitting: Internal Medicine

## 2018-07-24 ENCOUNTER — Other Ambulatory Visit: Payer: Self-pay

## 2018-07-24 DIAGNOSIS — N289 Disorder of kidney and ureter, unspecified: Secondary | ICD-10-CM

## 2018-07-24 DIAGNOSIS — Z8673 Personal history of transient ischemic attack (TIA), and cerebral infarction without residual deficits: Secondary | ICD-10-CM

## 2018-07-24 DIAGNOSIS — I10 Essential (primary) hypertension: Secondary | ICD-10-CM

## 2018-07-24 DIAGNOSIS — E7800 Pure hypercholesterolemia, unspecified: Secondary | ICD-10-CM

## 2018-07-24 DIAGNOSIS — D509 Iron deficiency anemia, unspecified: Secondary | ICD-10-CM

## 2018-07-24 DIAGNOSIS — F439 Reaction to severe stress, unspecified: Secondary | ICD-10-CM | POA: Diagnosis not present

## 2018-07-24 DIAGNOSIS — W19XXXA Unspecified fall, initial encounter: Secondary | ICD-10-CM | POA: Diagnosis not present

## 2018-07-24 DIAGNOSIS — R634 Abnormal weight loss: Secondary | ICD-10-CM | POA: Diagnosis not present

## 2018-07-24 MED ORDER — PRAVASTATIN SODIUM 40 MG PO TABS: 40.0000 mg | ORAL_TABLET | Freq: Every day | ORAL | 1 refills | Status: DC

## 2018-07-24 MED ORDER — CLOPIDOGREL BISULFATE 75 MG PO TABS: 75.0000 mg | ORAL_TABLET | Freq: Every day | ORAL | 1 refills | Status: DC

## 2018-07-24 MED ORDER — DILTIAZEM HCL ER COATED BEADS 120 MG PO CP24: 120.0000 mg | ORAL_CAPSULE | Freq: Every day | ORAL | 1 refills | Status: DC

## 2018-07-24 MED ORDER — SERTRALINE HCL 50 MG PO TABS: 50.0000 mg | ORAL_TABLET | Freq: Every day | ORAL | 1 refills | Status: DC

## 2018-07-24 NOTE — Progress Notes (Signed)
Patient ID: Rachel Martinez, female   DOB: 1927/08/17, 85 y.o.   MRN: [id number] .Marland Kitchen...   Virtual Visit via telephone Note  This visit type was conducted due to national recommendations for restrictions regarding the COVID-19 pandemic (e.g. social distancing).  This format is felt to be most appropriate for this patient at this time.  All issues noted in this document were discussed and addressed.  No physical exam was performed (except for noted visual exam findings with Video Visits).   I connected with Rachel Martinez by telephone and verified that I am speaking with the correct person using two identifiers. Location patient: home Location provider: work or home office Persons participating in the telephone visit:  Patient and provider.   I discussed the limitations, risks, security and privacy concerns of performing an evaluation and management service by telephone and the availability of in person appointments. The patient expressed understanding and agreed to proceed.   Reason for visit: scheduled follow up.   HPI: Increased stress in dealing with her husband's death.  Has good support. Discussed with her today.  She does not feel needs any further intervention.  She was seen in ER 07/18/18 - s/p fall.  CT head and neck negative.  Denies persistent headache.  She does report feeling unsteady.  May be a little worse since her fall.  Discussed using walker/cane.  Discussed therapy to help with gait.  She is in agreement for home health therapy.  No chest pain.  No sob.  No acid reflux.  No abdominal pain.  Bowels moving.  States she is eating.  Reports her weight is 106 pounds.  Previous weight - 112 pounds.  She desires no further testing or evaluation.     ROS: See pertinent positives and negatives per HPI.  Past Medical History:  Diagnosis Date  . Anemia   . Chicken pox   . CVA (cerebral vascular accident) ([place]) 8/06  . Depression   . GERD (gastroesophageal reflux disease)   . Heart  palpitations   . Hypercholesteremia   . Hypertension   . Stroke [facility])     Past Surgical History:  Procedure Laterality Date  . APPENDECTOMY      Family History  Problem Relation Age of Onset  . CVA Mother   . Diabetes Father   . Heart disease Father   . Cancer Sister        breast   . Diabetes Sister   . Cancer Brother        stomach  . Leukemia Brother   . Lung disease Brother   . Hypertension Sister   . Cancer Sister        stomach  . Diabetes Sister   . Mental illness Brother        suicide  . Breast cancer Paternal Aunt   . Diabetes Son     SOCIAL HX: reviewed.    Current Outpatient Medications:  .  calcium carbonate (TUMS - DOSED IN MG ELEMENTAL CALCIUM) 500 MG chewable tablet, Chew 1 tablet by mouth 3 (three) times daily as needed. , Disp: , Rfl:  .  clopidogrel (PLAVIX) 75 MG tablet, Take 1 tablet (75 mg total) by mouth daily., Disp: 90 tablet, Rfl: 1 .  diltiazem (CARDIZEM CD) 120 MG 24 hr capsule, Take 1 capsule (120 mg total) by mouth daily., Disp: 90 capsule, Rfl: 1 .  pravastatin (PRAVACHOL) 40 MG tablet, Take 1 tablet (40 mg total) by mouth daily., Disp: 90 tablet, Rfl:  1 .  sertraline (ZOLOFT) 50 MG tablet, Take 1 tablet (50 mg total) by mouth daily., Disp: 90 tablet, Rfl: 1  EXAM:  VITALS per patient if applicable: weight per pt- 106 pounds.   GENERAL: alert.  Sounds to be in no acute distress.  Answering questions appropriately.    PSYCH/NEURO: pleasant and cooperative, no obvious depression or anxiety, speech and thought processing grossly intact  ASSESSMENT AND PLAN:  Discussed the following assessment and plan:  Fall Recent fall as outlined.  CT head and neck - negative.  No persistent headache.  Does report feeling unsteady.  Will have home health PT evaluate and treat.    History of CVA (cerebrovascular accident) Doing well on plavix.    Hypercholesterolemia On pravastatin.  Low cholesterol diet and exercise.  Follow lipid panel and  liver function tests.    Hypertension Blood pressure doing well.  Continue current medication regimen.  Follow pressures.  Follow metabolic panel.    Iron deficiency anemia Has been followed by hematology.  Received IV iron infusion.  Follow cbc.    Renal insufficiency Follow metabolic panel.   Stress Increased stress as outlined.  Discussed with her today.  Has good support.  Does not feel needs any further intervention.  Follow.    Weight loss Persistent - per her report.  Eating.  Discussed further w/up.  She declines.  Follow.     I discussed the assessment and treatment plan with the patient. The patient was provided an opportunity to ask questions and all were answered. The patient agreed with the plan and demonstrated an understanding of the instructions.   The patient was advised to call back or seek an in-person evaluation if the symptoms worsen or if the condition fails to improve as anticipated.  I provided 25 minutes of non-face-to-face time during this encounter.   Einar Pheasant, MD

## 2018-07-28 ENCOUNTER — Encounter: Payer: Self-pay | Admitting: Internal Medicine

## 2018-07-28 NOTE — Assessment & Plan Note (Signed)
Doing well on plavix.

## 2018-07-28 NOTE — Assessment & Plan Note (Signed)
Recent fall as outlined.  CT head and neck - negative.  No persistent headache.  Does report feeling unsteady.  Will have home health PT evaluate and treat.

## 2018-07-28 NOTE — Assessment & Plan Note (Signed)
Follow metabolic panel.  

## 2018-07-28 NOTE — Assessment & Plan Note (Signed)
Increased stress as outlined.  Discussed with her today.  Has good support.  Does not feel needs any further intervention.  Follow.

## 2018-07-28 NOTE — Assessment & Plan Note (Signed)
Blood pressure doing well.  Continue current medication regimen.  Follow pressures.  Follow metabolic panel.  

## 2018-07-28 NOTE — Assessment & Plan Note (Signed)
On pravastatin.  Low cholesterol diet and exercise.  Follow lipid panel and liver function tests.   

## 2018-07-28 NOTE — Assessment & Plan Note (Signed)
Persistent - per her report.  Eating.  Discussed further w/up.  She declines.  Follow.

## 2018-07-28 NOTE — Assessment & Plan Note (Signed)
Has been followed by hematology.  Received IV iron infusion.  Follow cbc.

## 2018-08-01 DIAGNOSIS — I10 Essential (primary) hypertension: Secondary | ICD-10-CM | POA: Diagnosis not present

## 2018-08-01 DIAGNOSIS — R634 Abnormal weight loss: Secondary | ICD-10-CM | POA: Diagnosis not present

## 2018-08-01 DIAGNOSIS — I69312 Visuospatial deficit and spatial neglect following cerebral infarction: Secondary | ICD-10-CM | POA: Diagnosis not present

## 2018-08-01 DIAGNOSIS — R2681 Unsteadiness on feet: Secondary | ICD-10-CM | POA: Diagnosis not present

## 2018-08-01 DIAGNOSIS — Z8673 Personal history of transient ischemic attack (TIA), and cerebral infarction without residual deficits: Secondary | ICD-10-CM | POA: Diagnosis not present

## 2018-08-01 DIAGNOSIS — W19XXXD Unspecified fall, subsequent encounter: Secondary | ICD-10-CM | POA: Diagnosis not present

## 2018-08-01 DIAGNOSIS — D509 Iron deficiency anemia, unspecified: Secondary | ICD-10-CM | POA: Diagnosis not present

## 2018-08-01 DIAGNOSIS — F329 Major depressive disorder, single episode, unspecified: Secondary | ICD-10-CM | POA: Diagnosis not present

## 2018-08-01 DIAGNOSIS — Z9181 History of falling: Secondary | ICD-10-CM | POA: Diagnosis not present

## 2018-08-06 ENCOUNTER — Other Ambulatory Visit (INDEPENDENT_AMBULATORY_CARE_PROVIDER_SITE_OTHER): Payer: Medicare Other

## 2018-08-06 ENCOUNTER — Other Ambulatory Visit: Payer: Self-pay

## 2018-08-06 DIAGNOSIS — E7800 Pure hypercholesterolemia, unspecified: Secondary | ICD-10-CM | POA: Diagnosis not present

## 2018-08-06 DIAGNOSIS — D509 Iron deficiency anemia, unspecified: Secondary | ICD-10-CM | POA: Diagnosis not present

## 2018-08-06 DIAGNOSIS — I10 Essential (primary) hypertension: Secondary | ICD-10-CM | POA: Diagnosis not present

## 2018-08-06 LAB — CBC WITH DIFFERENTIAL/PLATELET
Basophils Absolute: 0 10*3/uL (ref 0.0–0.1)
Basophils Relative: 0.8 % (ref 0.0–3.0)
Eosinophils Absolute: 0.1 10*3/uL (ref 0.0–0.7)
Eosinophils Relative: 1.5 % (ref 0.0–5.0)
HCT: 35.4 % — ABNORMAL LOW (ref 36.0–46.0)
Hemoglobin: 11.8 g/dL — ABNORMAL LOW (ref 12.0–15.0)
Lymphocytes Relative: 20.3 % (ref 12.0–46.0)
Lymphs Abs: 1.2 10*3/uL (ref 0.7–4.0)
MCHC: 33.4 g/dL (ref 30.0–36.0)
MCV: 92.2 fl (ref 78.0–100.0)
Monocytes Absolute: 0.4 10*3/uL (ref 0.1–1.0)
Monocytes Relative: 6 % (ref 3.0–12.0)
Neutro Abs: 4.3 10*3/uL (ref 1.4–7.7)
Neutrophils Relative %: 71.4 % (ref 43.0–77.0)
Platelets: 297 10*3/uL (ref 150.0–400.0)
RBC: 3.84 Mil/uL — ABNORMAL LOW (ref 3.87–5.11)
RDW: 14.3 % (ref 11.5–15.5)
WBC: 6 10*3/uL (ref 4.0–10.5)

## 2018-08-06 LAB — LIPID PANEL
Cholesterol: 184 mg/dL (ref 0–200)
HDL: 62.2 mg/dL (ref >39.00)
LDL Cholesterol: 94 mg/dL (ref 0–99)
NonHDL: 122.19
Total CHOL/HDL Ratio: 3
Triglycerides: 139 mg/dL (ref 0.0–149.0)
VLDL: 27.8 mg/dL (ref 0.0–40.0)

## 2018-08-06 LAB — HEPATIC FUNCTION PANEL
ALT: 7 U/L (ref 0–35)
AST: 18 U/L (ref 0–37)
Albumin: 4 g/dL (ref 3.5–5.2)
Alkaline Phosphatase: 89 U/L (ref 39–117)
Bilirubin, Direct: 0.1 mg/dL (ref 0.0–0.3)
Total Bilirubin: 0.4 mg/dL (ref 0.2–1.2)
Total Protein: 6.1 g/dL (ref 6.0–8.3)

## 2018-08-06 LAB — BASIC METABOLIC PANEL
BUN: 16 mg/dL (ref 6–23)
CO2: 30 mEq/L (ref 19–32)
Calcium: 8.8 mg/dL (ref 8.4–10.5)
Chloride: 104 mEq/L (ref 96–112)
Creatinine, Ser: 0.96 mg/dL (ref 0.40–1.20)
GFR: 54.67 mL/min — ABNORMAL LOW (ref >60.00)
Glucose, Bld: 91 mg/dL (ref 70–99)
Potassium: 4.1 mEq/L (ref 3.5–5.1)
Sodium: 142 mEq/L (ref 135–145)

## 2018-08-06 LAB — FERRITIN: Ferritin: 110.9 ng/mL (ref 10.0–291.0)

## 2018-08-06 LAB — TSH: TSH: 1.72 u[IU]/mL (ref 0.35–4.50)

## 2018-08-07 ENCOUNTER — Other Ambulatory Visit: Payer: Self-pay | Admitting: Internal Medicine

## 2018-08-07 DIAGNOSIS — W19XXXD Unspecified fall, subsequent encounter: Secondary | ICD-10-CM | POA: Diagnosis not present

## 2018-08-07 DIAGNOSIS — F329 Major depressive disorder, single episode, unspecified: Secondary | ICD-10-CM | POA: Diagnosis not present

## 2018-08-07 DIAGNOSIS — K259 Gastric ulcer, unspecified as acute or chronic, without hemorrhage or perforation: Secondary | ICD-10-CM

## 2018-08-07 DIAGNOSIS — Z9181 History of falling: Secondary | ICD-10-CM | POA: Diagnosis not present

## 2018-08-07 DIAGNOSIS — I69312 Visuospatial deficit and spatial neglect following cerebral infarction: Secondary | ICD-10-CM | POA: Diagnosis not present

## 2018-08-07 DIAGNOSIS — D649 Anemia, unspecified: Secondary | ICD-10-CM

## 2018-08-07 DIAGNOSIS — N289 Disorder of kidney and ureter, unspecified: Secondary | ICD-10-CM

## 2018-08-07 DIAGNOSIS — D509 Iron deficiency anemia, unspecified: Secondary | ICD-10-CM | POA: Diagnosis not present

## 2018-08-07 DIAGNOSIS — R2681 Unsteadiness on feet: Secondary | ICD-10-CM | POA: Diagnosis not present

## 2018-08-07 DIAGNOSIS — R634 Abnormal weight loss: Secondary | ICD-10-CM

## 2018-08-07 NOTE — Progress Notes (Signed)
Opened in error

## 2018-08-09 DIAGNOSIS — D509 Iron deficiency anemia, unspecified: Secondary | ICD-10-CM | POA: Diagnosis not present

## 2018-08-09 DIAGNOSIS — F329 Major depressive disorder, single episode, unspecified: Secondary | ICD-10-CM | POA: Diagnosis not present

## 2018-08-09 DIAGNOSIS — Z9181 History of falling: Secondary | ICD-10-CM | POA: Diagnosis not present

## 2018-08-09 DIAGNOSIS — W19XXXD Unspecified fall, subsequent encounter: Secondary | ICD-10-CM | POA: Diagnosis not present

## 2018-08-09 DIAGNOSIS — I69312 Visuospatial deficit and spatial neglect following cerebral infarction: Secondary | ICD-10-CM | POA: Diagnosis not present

## 2018-08-09 DIAGNOSIS — R2681 Unsteadiness on feet: Secondary | ICD-10-CM | POA: Diagnosis not present

## 2018-08-13 DIAGNOSIS — I69312 Visuospatial deficit and spatial neglect following cerebral infarction: Secondary | ICD-10-CM | POA: Diagnosis not present

## 2018-08-13 DIAGNOSIS — R2681 Unsteadiness on feet: Secondary | ICD-10-CM | POA: Diagnosis not present

## 2018-08-13 DIAGNOSIS — W19XXXD Unspecified fall, subsequent encounter: Secondary | ICD-10-CM | POA: Diagnosis not present

## 2018-08-13 DIAGNOSIS — R634 Abnormal weight loss: Secondary | ICD-10-CM | POA: Diagnosis not present

## 2018-08-13 DIAGNOSIS — Z9181 History of falling: Secondary | ICD-10-CM | POA: Diagnosis not present

## 2018-08-13 DIAGNOSIS — D509 Iron deficiency anemia, unspecified: Secondary | ICD-10-CM

## 2018-08-13 DIAGNOSIS — I10 Essential (primary) hypertension: Secondary | ICD-10-CM

## 2018-08-13 DIAGNOSIS — F329 Major depressive disorder, single episode, unspecified: Secondary | ICD-10-CM | POA: Diagnosis not present

## 2018-08-13 DIAGNOSIS — Z8673 Personal history of transient ischemic attack (TIA), and cerebral infarction without residual deficits: Secondary | ICD-10-CM | POA: Diagnosis not present

## 2018-08-15 DIAGNOSIS — D509 Iron deficiency anemia, unspecified: Secondary | ICD-10-CM | POA: Diagnosis not present

## 2018-08-15 DIAGNOSIS — F329 Major depressive disorder, single episode, unspecified: Secondary | ICD-10-CM | POA: Diagnosis not present

## 2018-08-15 DIAGNOSIS — I69312 Visuospatial deficit and spatial neglect following cerebral infarction: Secondary | ICD-10-CM | POA: Diagnosis not present

## 2018-08-15 DIAGNOSIS — W19XXXD Unspecified fall, subsequent encounter: Secondary | ICD-10-CM | POA: Diagnosis not present

## 2018-08-15 DIAGNOSIS — R2681 Unsteadiness on feet: Secondary | ICD-10-CM | POA: Diagnosis not present

## 2018-08-15 DIAGNOSIS — Z9181 History of falling: Secondary | ICD-10-CM | POA: Diagnosis not present

## 2018-08-20 DIAGNOSIS — W19XXXD Unspecified fall, subsequent encounter: Secondary | ICD-10-CM | POA: Diagnosis not present

## 2018-08-20 DIAGNOSIS — I69312 Visuospatial deficit and spatial neglect following cerebral infarction: Secondary | ICD-10-CM | POA: Diagnosis not present

## 2018-08-20 DIAGNOSIS — R2681 Unsteadiness on feet: Secondary | ICD-10-CM | POA: Diagnosis not present

## 2018-08-20 DIAGNOSIS — F329 Major depressive disorder, single episode, unspecified: Secondary | ICD-10-CM | POA: Diagnosis not present

## 2018-08-20 DIAGNOSIS — Z9181 History of falling: Secondary | ICD-10-CM | POA: Diagnosis not present

## 2018-08-20 DIAGNOSIS — D509 Iron deficiency anemia, unspecified: Secondary | ICD-10-CM | POA: Diagnosis not present

## 2018-08-28 DIAGNOSIS — D509 Iron deficiency anemia, unspecified: Secondary | ICD-10-CM | POA: Diagnosis not present

## 2018-08-28 DIAGNOSIS — F329 Major depressive disorder, single episode, unspecified: Secondary | ICD-10-CM | POA: Diagnosis not present

## 2018-08-28 DIAGNOSIS — R2681 Unsteadiness on feet: Secondary | ICD-10-CM | POA: Diagnosis not present

## 2018-08-28 DIAGNOSIS — W19XXXD Unspecified fall, subsequent encounter: Secondary | ICD-10-CM | POA: Diagnosis not present

## 2018-08-28 DIAGNOSIS — I69312 Visuospatial deficit and spatial neglect following cerebral infarction: Secondary | ICD-10-CM | POA: Diagnosis not present

## 2018-08-28 DIAGNOSIS — Z9181 History of falling: Secondary | ICD-10-CM | POA: Diagnosis not present

## 2018-09-04 ENCOUNTER — Telehealth: Payer: Self-pay | Admitting: *Deleted

## 2018-09-04 DIAGNOSIS — D649 Anemia, unspecified: Secondary | ICD-10-CM

## 2018-09-04 NOTE — Telephone Encounter (Signed)
Please place future orders for lab appt.  

## 2018-09-05 ENCOUNTER — Other Ambulatory Visit: Payer: Self-pay

## 2018-09-05 ENCOUNTER — Other Ambulatory Visit (INDEPENDENT_AMBULATORY_CARE_PROVIDER_SITE_OTHER): Payer: Medicare Other

## 2018-09-05 DIAGNOSIS — D649 Anemia, unspecified: Secondary | ICD-10-CM | POA: Diagnosis not present

## 2018-09-05 NOTE — Telephone Encounter (Signed)
Order placed for f/u cbc.   

## 2018-09-06 LAB — CBC WITH DIFFERENTIAL/PLATELET
Basophils Absolute: 0 10*3/uL (ref 0.0–0.1)
Basophils Relative: 0.8 % (ref 0.0–3.0)
Eosinophils Absolute: 0.1 10*3/uL (ref 0.0–0.7)
Eosinophils Relative: 1.1 % (ref 0.0–5.0)
HCT: 33.1 % — ABNORMAL LOW (ref 36.0–46.0)
Hemoglobin: 11.3 g/dL — ABNORMAL LOW (ref 12.0–15.0)
Lymphocytes Relative: 23.3 % (ref 12.0–46.0)
Lymphs Abs: 1.3 10*3/uL (ref 0.7–4.0)
MCHC: 34 g/dL (ref 30.0–36.0)
MCV: 93.1 fl (ref 78.0–100.0)
Monocytes Absolute: 0.4 10*3/uL (ref 0.1–1.0)
Monocytes Relative: 6.8 % (ref 3.0–12.0)
Neutro Abs: 3.8 10*3/uL (ref 1.4–7.7)
Neutrophils Relative %: 68 % (ref 43.0–77.0)
Platelets: 229 10*3/uL (ref 150.0–400.0)
RBC: 3.56 Mil/uL — ABNORMAL LOW (ref 3.87–5.11)
RDW: 14.9 % (ref 11.5–15.5)
WBC: 5.6 10*3/uL (ref 4.0–10.5)

## 2018-09-21 ENCOUNTER — Encounter: Payer: Self-pay | Admitting: *Deleted

## 2018-10-15 ENCOUNTER — Telehealth: Payer: Self-pay

## 2018-10-15 ENCOUNTER — Encounter: Payer: Self-pay | Admitting: Internal Medicine

## 2018-10-15 ENCOUNTER — Other Ambulatory Visit: Payer: Self-pay

## 2018-10-15 ENCOUNTER — Ambulatory Visit (INDEPENDENT_AMBULATORY_CARE_PROVIDER_SITE_OTHER): Payer: Medicare Other | Admitting: Internal Medicine

## 2018-10-15 DIAGNOSIS — E7800 Pure hypercholesterolemia, unspecified: Secondary | ICD-10-CM

## 2018-10-15 DIAGNOSIS — Z8673 Personal history of transient ischemic attack (TIA), and cerebral infarction without residual deficits: Secondary | ICD-10-CM

## 2018-10-15 DIAGNOSIS — D509 Iron deficiency anemia, unspecified: Secondary | ICD-10-CM | POA: Diagnosis not present

## 2018-10-15 DIAGNOSIS — I10 Essential (primary) hypertension: Secondary | ICD-10-CM | POA: Diagnosis not present

## 2018-10-15 DIAGNOSIS — F439 Reaction to severe stress, unspecified: Secondary | ICD-10-CM | POA: Diagnosis not present

## 2018-10-15 DIAGNOSIS — R634 Abnormal weight loss: Secondary | ICD-10-CM | POA: Diagnosis not present

## 2018-10-15 NOTE — Progress Notes (Signed)
Patient ID: Rachel Martinez, female   DOB: 16-Jul-1927, 85 y.o.   MRN: [id number]   Virtual Visit via telephone Note  This visit type was conducted due to national recommendations for restrictions regarding the COVID-19 pandemic (e.g. social distancing).  This format is felt to be most appropriate for this patient at this time.  All issues noted in this document were discussed and addressed.  No physical exam was performed (except for noted visual exam findings with Video Visits).   I connected with Rachel Martinez by telephone and verified that I am speaking with the correct person using two identifiers. Location patient: home Location provider: work  Persons participating in the virtual visit: patient, provider  I discussed the limitations, risks, security and privacy concerns of performing an evaluation and management service by telephone and the availability of in person appointments. The patient expressed understanding and agreed to proceed.   Reason for visit: scheduled follow up  HPI: She reports increased stress dealing with her husband's death.  Discussed with her today.  Overall she feels she is doing relatively well.  Does not feel needs any further intervention.  No chest pain.  No sob.  No acid reflux.  No abdominal pain.  Bowels moving.  She is eating.  States weight is down.  Reports weighs 103 pounds.  Not sure if her scales are accurate.  Discussed the need for f/u labs and need for flu shot.     ROS: See pertinent positives and negatives per HPI.  Past Medical History:  Diagnosis Date  . Anemia   . Chicken pox   . CVA (cerebral vascular accident) ([place]) 8/06  . Depression   . GERD (gastroesophageal reflux disease)   . Heart palpitations   . Hypercholesteremia   . Hypertension   . Stroke [facility])     Past Surgical History:  Procedure Laterality Date  . APPENDECTOMY      Family History  Problem Relation Age of Onset  . CVA Mother   . Diabetes Father   . Heart  disease Father   . Cancer Sister        breast   . Diabetes Sister   . Cancer Brother        stomach  . Leukemia Brother   . Lung disease Brother   . Hypertension Sister   . Cancer Sister        stomach  . Diabetes Sister   . Mental illness Brother        suicide  . Breast cancer Paternal Aunt   . Diabetes Son     SOCIAL HX: reviewed.    Current Outpatient Medications:  .  calcium carbonate (TUMS - DOSED IN MG ELEMENTAL CALCIUM) 500 MG chewable tablet, Chew 1 tablet by mouth 3 (three) times daily as needed. , Disp: , Rfl:  .  clopidogrel (PLAVIX) 75 MG tablet, Take 1 tablet (75 mg total) by mouth daily., Disp: 90 tablet, Rfl: 1 .  diltiazem (CARDIZEM CD) 120 MG 24 hr capsule, Take 1 capsule (120 mg total) by mouth daily., Disp: 90 capsule, Rfl: 1 .  pravastatin (PRAVACHOL) 40 MG tablet, Take 1 tablet (40 mg total) by mouth daily., Disp: 90 tablet, Rfl: 1 .  sertraline (ZOLOFT) 50 MG tablet, Take 1 tablet (50 mg total) by mouth daily., Disp: 90 tablet, Rfl: 1  EXAM:  VITALS per patient if applicable: weight [id number] pounds.   GENERAL: alert. Sounds to be in no acute distress.    PSYCH/NEURO:  pleasant and cooperative, no obvious depression or anxiety, speech and thought processing grossly intact  ASSESSMENT AND PLAN:  Discussed the following assessment and plan:  History of CVA (cerebrovascular accident) Doing well on plavix.  Follow.    Hypercholesterolemia On pravastatin.  Follow lipid panel and liver function tests.    Hypertension Blood pressure has been under reasonable control.  Follow pressures.  Follow metabolic panel.   Iron deficiency anemia Has been followed by hematology.  Received IV iron infusion.  Needs f/u cbc.  Will check iron studies and B12.    Stress Increased stress as outlined.  Discussed with her today.  Has good support.  Does not feel needs any further intervention.  Follow.    Weight loss States she weighs 103 pounds today.  Reported weight  106 pounds in 06/2018.  When she comes in for f/u labs, will weight here to confirm weight.  Discussed further w/up and evaluation.  Wants to hold at this time. Agreed to weight when in for labs.      I discussed the assessment and treatment plan with the patient. The patient was provided an opportunity to ask questions and all were answered. The patient agreed with the plan and demonstrated an understanding of the instructions.   The patient was advised to call back or seek an in-person evaluation if the symptoms worsen or if the condition fails to improve as anticipated.  I provided 17 minutes of non-face-to-face time during this encounter.   Einar Pheasant, MD

## 2018-10-15 NOTE — Telephone Encounter (Signed)
Copied from [place] [id number]. Topic: General - Other >> Oct 15, 2018 12:16 PM Antonieta Iba C wrote: Reason for CRM: pt called in to make provider aware that she missed her apt today due to transportation. Tried reaching the office to reschedule apt, pt hung up before transfer.

## 2018-10-15 NOTE — Progress Notes (Signed)
Patient concerned about weight loss.

## 2018-10-20 ENCOUNTER — Encounter: Payer: Self-pay | Admitting: Internal Medicine

## 2018-10-20 NOTE — Assessment & Plan Note (Signed)
Increased stress as outlined.  Discussed with her today.  Has good support.  Does not feel needs any further intervention.  Follow.   

## 2018-10-20 NOTE — Assessment & Plan Note (Signed)
On pravastatin.  Follow lipid panel and liver function tests.   

## 2018-10-20 NOTE — Assessment & Plan Note (Signed)
States she weighs 103 pounds today.  Reported weight 106 pounds in 06/2018.  When she comes in for f/u labs, will weight here to confirm weight.  Discussed further w/up and evaluation.  Wants to hold at this time. Agreed to weight when in for labs.

## 2018-10-20 NOTE — Assessment & Plan Note (Signed)
Blood pressure has been under reasonable control.  Follow pressures.  Follow metabolic panel.   

## 2018-10-20 NOTE — Assessment & Plan Note (Signed)
Has been followed by hematology.  Received IV iron infusion.  Needs f/u cbc.  Will check iron studies and B12.

## 2018-10-20 NOTE — Assessment & Plan Note (Signed)
Doing well on plavix.  Follow.  

## 2018-10-26 ENCOUNTER — Other Ambulatory Visit: Payer: Self-pay

## 2018-10-26 ENCOUNTER — Other Ambulatory Visit (INDEPENDENT_AMBULATORY_CARE_PROVIDER_SITE_OTHER): Payer: Medicare Other

## 2018-10-26 DIAGNOSIS — Z23 Encounter for immunization: Secondary | ICD-10-CM

## 2018-10-26 DIAGNOSIS — D509 Iron deficiency anemia, unspecified: Secondary | ICD-10-CM | POA: Diagnosis not present

## 2018-10-26 LAB — VITAMIN B12: Vitamin B-12: 383 pg/mL (ref 211–911)

## 2018-10-26 LAB — CBC WITH DIFFERENTIAL/PLATELET
Basophils Absolute: 0 10*3/uL (ref 0.0–0.1)
Basophils Relative: 0.8 % (ref 0.0–3.0)
Eosinophils Absolute: 0.1 10*3/uL (ref 0.0–0.7)
Eosinophils Relative: 1.4 % (ref 0.0–5.0)
HCT: 37.4 % (ref 36.0–46.0)
Hemoglobin: 12.3 g/dL (ref 12.0–15.0)
Lymphocytes Relative: 32.5 % (ref 12.0–46.0)
Lymphs Abs: 1.6 10*3/uL (ref 0.7–4.0)
MCHC: 32.8 g/dL (ref 30.0–36.0)
MCV: 94.8 fl (ref 78.0–100.0)
Monocytes Absolute: 0.3 10*3/uL (ref 0.1–1.0)
Monocytes Relative: 6.3 % (ref 3.0–12.0)
Neutro Abs: 2.9 10*3/uL (ref 1.4–7.7)
Neutrophils Relative %: 59 % (ref 43.0–77.0)
Platelets: 199 10*3/uL (ref 150.0–400.0)
RBC: 3.95 Mil/uL (ref 3.87–5.11)
RDW: 14.6 % (ref 11.5–15.5)
WBC: 5 10*3/uL (ref 4.0–10.5)

## 2018-10-26 LAB — IBC + FERRITIN
Ferritin: 45.7 ng/mL (ref 10.0–291.0)
Iron: 55 ug/dL (ref 42–145)
Saturation Ratios: 17.1 % — ABNORMAL LOW (ref 20.0–50.0)
Transferrin: 230 mg/dL (ref 212.0–360.0)

## 2018-10-29 ENCOUNTER — Other Ambulatory Visit: Payer: Self-pay

## 2018-10-29 ENCOUNTER — Ambulatory Visit (INDEPENDENT_AMBULATORY_CARE_PROVIDER_SITE_OTHER): Payer: Medicare Other | Admitting: Internal Medicine

## 2018-10-29 VITALS — BP 128/62 | HR 76 | Temp 97.5°F | Resp 16 | Wt 104.0 lb

## 2018-10-29 DIAGNOSIS — I10 Essential (primary) hypertension: Secondary | ICD-10-CM | POA: Diagnosis not present

## 2018-10-29 DIAGNOSIS — Z8673 Personal history of transient ischemic attack (TIA), and cerebral infarction without residual deficits: Secondary | ICD-10-CM

## 2018-10-29 DIAGNOSIS — R634 Abnormal weight loss: Secondary | ICD-10-CM | POA: Diagnosis not present

## 2018-10-29 DIAGNOSIS — E7800 Pure hypercholesterolemia, unspecified: Secondary | ICD-10-CM

## 2018-10-29 DIAGNOSIS — R410 Disorientation, unspecified: Secondary | ICD-10-CM

## 2018-10-29 DIAGNOSIS — F439 Reaction to severe stress, unspecified: Secondary | ICD-10-CM

## 2018-10-29 DIAGNOSIS — D509 Iron deficiency anemia, unspecified: Secondary | ICD-10-CM | POA: Diagnosis not present

## 2018-10-29 LAB — BASIC METABOLIC PANEL
BUN: 19 mg/dL (ref 6–23)
CO2: 31 mEq/L (ref 19–32)
Calcium: 9.1 mg/dL (ref 8.4–10.5)
Chloride: 105 mEq/L (ref 96–112)
Creatinine, Ser: 0.91 mg/dL (ref 0.40–1.20)
GFR: 58.12 mL/min — ABNORMAL LOW (ref >60.00)
Glucose, Bld: 72 mg/dL (ref 70–99)
Potassium: 4.1 mEq/L (ref 3.5–5.1)
Sodium: 142 mEq/L (ref 135–145)

## 2018-10-29 LAB — HEPATIC FUNCTION PANEL
ALT: 8 U/L (ref 0–35)
AST: 21 U/L (ref 0–37)
Albumin: 4 g/dL (ref 3.5–5.2)
Alkaline Phosphatase: 74 U/L (ref 39–117)
Bilirubin, Direct: 0.1 mg/dL (ref 0.0–0.3)
Total Bilirubin: 0.3 mg/dL (ref 0.2–1.2)
Total Protein: 6.4 g/dL (ref 6.0–8.3)

## 2018-10-29 LAB — HEMOGLOBIN A1C: Hgb A1c MFr Bld: 5.4 % (ref 4.6–6.5)

## 2018-10-29 NOTE — Progress Notes (Signed)
Patient ID: KNOWLEDGE LEADERS, female   DOB: May 02, 1927, 85 y.o.   MRN: [id number]   Subjective:    Patient ID: Rachel Martinez, female    DOB: April 24, 1927, 85 y.o.   MRN: [id number]  HPI  Patient here as a work in appt.  Son accompanies her.  History obtained from both of them.  Had episode recently where she was hard to arouse.  Pt does not remember the actual episode.  Neighbor came to check on her and could not get her to open the door.  Son was called. EMS called.  Pt evaluated.  Was not taken to ER.  Reports she has been fine since.  No chest pain. Breathing stable. No headache or dizziness.  No acid reflux.  No abdominal pain.  Bowels moving. Some generalized weakness.  No focal deficits.  Discussed physical therapy.  Declines.  Increased stress.  Does not feel needs any further intervention.     Past Medical History:  Diagnosis Date   Anemia    Chicken pox    CVA (cerebral vascular accident) ([place]) 8/06   Depression    GERD (gastroesophageal reflux disease)    Heart palpitations    Hypercholesteremia    Hypertension    Stroke [facility])    Past Surgical History:  Procedure Laterality Date   APPENDECTOMY     Family History  Problem Relation Age of Onset   CVA Mother    Diabetes Father    Heart disease Father    Cancer Sister        breast    Diabetes Sister    Cancer Brother        stomach   Leukemia Brother    Lung disease Brother    Hypertension Sister    Cancer Sister        stomach   Diabetes Sister    Mental illness Brother        suicide   Breast cancer Paternal Aunt    Diabetes Son    Social History   Socioeconomic History   Marital status: Married    Spouse name: Not on file   Number of children: Not on file   Years of education: Not on file   Highest education level: Not on file  Occupational History   Not on file  Social Needs   Financial resource strain: Not hard at all   Food insecurity    Worry: Never true   Inability: Never true   Transportation needs    Medical: No    Non-medical: No  Tobacco Use   Smoking status: Former Smoker    Packs/day: 0.25    Years: 30.00    Pack years: 7.50    Quit date: 09/13/2003    Years since quitting: 15.1   Smokeless tobacco: Never Used  Substance and Sexual Activity   Alcohol use: No    Alcohol/week: 0.0 standard drinks   Drug use: No   Sexual activity: Never  Lifestyle   Physical activity    Days per week: 2 days    Minutes per session: 10 min   Stress: Not at all  Relationships   Social connections    Talks on phone: Not on file    Gets together: Not on file    Attends religious service: Not on file    Active member of club or organization: Not on file    Attends meetings of clubs or organizations: Not on file    Relationship status: Not  on file  Other Topics Concern   Not on file  Social History Narrative   Not on file    Outpatient Encounter Medications as of 10/29/2018  Medication Sig   calcium carbonate (TUMS - DOSED IN MG ELEMENTAL CALCIUM) 500 MG chewable tablet Chew 1 tablet by mouth 3 (three) times daily as needed.    clopidogrel (PLAVIX) 75 MG tablet Take 1 tablet (75 mg total) by mouth daily.   diltiazem (CARDIZEM CD) 120 MG 24 hr capsule Take 1 capsule (120 mg total) by mouth daily.   pravastatin (PRAVACHOL) 40 MG tablet Take 1 tablet (40 mg total) by mouth daily.   sertraline (ZOLOFT) 50 MG tablet Take 1 tablet (50 mg total) by mouth daily.   No facility-administered encounter medications on file as of 10/29/2018.     Review of Systems  Constitutional: Negative for fever.       States she is eating.  Weight 104 lbs.  One pound increase from the last visit.  Weight recorded 130 pounds in June.    HENT: Negative for congestion and sinus pressure.   Respiratory: Negative for cough, chest tightness and shortness of breath.   Cardiovascular: Negative for chest pain, palpitations and leg swelling.    Gastrointestinal: Negative for abdominal pain, diarrhea, nausea and vomiting.  Genitourinary: Negative for difficulty urinating and dysuria.  Musculoskeletal: Negative for joint swelling and myalgias.  Skin: Negative for color change and rash.  Neurological: Negative for dizziness, light-headedness and headaches.  Psychiatric/Behavioral: Negative for agitation and dysphoric mood.       Objective:    Physical Exam Constitutional:      General: She is not in acute distress.    Appearance: Normal appearance.  HENT:     Head: Normocephalic and atraumatic.     Right Ear: External ear normal.     Left Ear: External ear normal.  Eyes:     General: No scleral icterus.       Right eye: No discharge.        Left eye: No discharge.     Conjunctiva/sclera: Conjunctivae normal.  Neck:     Musculoskeletal: Neck supple. No muscular tenderness.     Thyroid: No thyromegaly.  Cardiovascular:     Rate and Rhythm: Normal rate and regular rhythm.  Pulmonary:     Effort: No respiratory distress.     Breath sounds: Normal breath sounds. No wheezing.  Abdominal:     General: Bowel sounds are normal.     Palpations: Abdomen is soft.     Tenderness: There is no abdominal tenderness.  Musculoskeletal:        General: No swelling or tenderness.  Lymphadenopathy:     Cervical: No cervical adenopathy.  Skin:    Findings: No erythema or rash.  Neurological:     Mental Status: She is alert.  Psychiatric:        Mood and Affect: Mood normal.        Behavior: Behavior normal.     BP 128/62    Pulse 76    Temp (!) 97.5 F (36.4 C)    Resp 16    Wt 104 lb (47.2 kg)    SpO2 97%    BMI 17.31 kg/m  Wt Readings from Last 3 Encounters:  10/29/18 104 lb (47.2 kg)  10/15/18 103 lb (46.7 kg)  07/18/18 130 lb (59 kg)     Lab Results  Component Value Date   WBC 5.0 10/26/2018   HGB 12.3  10/26/2018   HCT 37.4 10/26/2018   PLT 199.0 10/26/2018   GLUCOSE 72 10/29/2018   CHOL 184 08/06/2018    TRIG 139.0 08/06/2018   HDL 62.20 08/06/2018   LDLCALC 94 08/06/2018   ALT 8 10/29/2018   AST 21 10/29/2018   NA 142 10/29/2018   K 4.1 10/29/2018   CL 105 10/29/2018   CREATININE 0.91 10/29/2018   BUN 19 10/29/2018   CO2 31 10/29/2018   TSH 1.72 08/06/2018   HGBA1C 5.4 10/29/2018    Ct Head Wo Contrast  Result Date: 07/18/2018 CLINICAL DATA:  Fall last night.  Head injury. EXAM: CT HEAD WITHOUT CONTRAST CT CERVICAL SPINE WITHOUT CONTRAST TECHNIQUE: Multidetector CT imaging of the head and cervical spine was performed following the standard protocol without intravenous contrast. Multiplanar CT image reconstructions of the cervical spine were also generated. COMPARISON:  CT head 10/22/2016.  MRI brain 10/22/2016. FINDINGS: CT HEAD FINDINGS Brain: There is no evidence of acute intracranial hemorrhage, mass lesion, brain edema or extra-axial fluid collection. There is stable mild atrophy with prominence of the ventricles and subarachnoid spaces. Patchy and confluent low-density is present in the periventricular and subcortical white matter, similar to previous study. There is no CT evidence of acute cortical infarction. Vascular: Prominent intracranial vascular calcifications. No hyperdense vessel identified. Skull: Negative for fracture or focal lesion. 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 The paranasal sinuses are otherwise clear without air-fluid levels. The mastoid air cells and middle ears are clear. Previous bilateral lens surgery without acute orbital findings. Other: Right parietal scalp soft tissue injury. CT CERVICAL SPINE FINDINGS Alignment: Normal. Skull base and vertebrae: No evidence of acute fracture or traumatic subluxation. The posterior arch of C1 is incomplete. Soft tissues and spinal canal: No prevertebral fluid or swelling. No visible canal hematoma. Disc levels: Mild multilevel spondylosis for age with mild  uncinate spurring and facet hypertrophy. No large disc herniation or high-grade spinal stenosis identified. Upper chest: Mild biapical scarring. Other: None. IMPRESSION: 1. Right parietal scalp soft tissue injury without evidence of calvarial fracture. 2. No acute intracranial findings. Stable atrophy and chronic small vessel ischemic changes. 3. No evidence of acute cervical spine fracture, traumatic subluxation or static signs of instability. Mild cervical spondylosis. Electronically Signed   By: Richardean Sale M.D.   On: 07/18/2018 20:38   Ct Cervical Spine Wo Contrast  Result Date: 07/18/2018 CLINICAL DATA:  Fall last night.  Head injury. EXAM: CT HEAD WITHOUT CONTRAST CT CERVICAL SPINE WITHOUT CONTRAST TECHNIQUE: Multidetector CT imaging of the head and cervical spine was performed following the standard protocol without intravenous contrast. Multiplanar CT image reconstructions of the cervical spine were also generated. COMPARISON:  CT head 10/22/2016.  MRI brain 10/22/2016. FINDINGS: CT HEAD FINDINGS Brain: There is no evidence of acute intracranial hemorrhage, mass lesion, brain edema or extra-axial fluid collection. There is stable mild atrophy with prominence of the ventricles and subarachnoid spaces. Patchy and confluent low-density is present in the periventricular and subcortical white matter, similar to previous study. There is no CT evidence of acute cortical infarction. Vascular: Prominent intracranial vascular calcifications. No hyperdense vessel identified. Skull: Negative for fracture or focal lesion. 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 The paranasal sinuses are otherwise clear without air-fluid levels. The mastoid air cells and middle ears are clear. Previous bilateral lens surgery without acute orbital findings. Other: Right parietal scalp soft tissue  injury. CT CERVICAL SPINE FINDINGS Alignment: Normal. Skull base and  vertebrae: No evidence of acute fracture or traumatic subluxation. The posterior arch of C1 is incomplete. Soft tissues and spinal canal: No prevertebral fluid or swelling. No visible canal hematoma. Disc levels: Mild multilevel spondylosis for age with mild uncinate spurring and facet hypertrophy. No large disc herniation or high-grade spinal stenosis identified. Upper chest: Mild biapical scarring. Other: None. IMPRESSION: 1. Right parietal scalp soft tissue injury without evidence of calvarial fracture. 2. No acute intracranial findings. Stable atrophy and chronic small vessel ischemic changes. 3. No evidence of acute cervical spine fracture, traumatic subluxation or static signs of instability. Mild cervical spondylosis. Electronically Signed   By: Richardean Sale M.D.   On: 07/18/2018 20:38       Assessment & Plan:   Problem List Items Addressed This Visit    Altered mental state - Primary    Had a previous episode back in 2018 where she was found down.  Had w/up in hospital that was unrevealing of etiology.  EEG - normal.  Had episode recently as outlined.  Unclear etiology.  EKG - SR/SB with no acute ischemic changes.  Discussed cardiology evaluation to confirm no cardiac etiology, given bradycardia noted on exam.  Question of need for monitor. Has been on cardizem for years.  May need to adjust medication.   Also discussed neurology evaluation.  Had this episode and similar episode in 2018.  MRI 2018 - no acute abnormality.  EEG unrevealing.  Check routine labs.  Will have neurology evaluate.  Pt and son comfortable with this plan.  On plavix.        Relevant Orders   Hemoglobin A1c (Completed)   Hepatic function panel (Completed)   Basic metabolic panel (Completed)   EKG 12-Lead (Completed)   Ambulatory referral to Cardiology   Ambulatory referral to Neurology   History of CVA (cerebrovascular accident)    Has been on plavix. Previous MRI unrevealing.  No focal deficits noted on exam.   Refer back to neurology.        Hypercholesterolemia    On pravastatin.  Follow lipid panel and liver function tests.        Hypertension    Blood pressure under good control.  Continue same medication regimen.  Follow pressures.  Follow metabolic panel.        Iron deficiency anemia    Has been followed by hematology.  Has received IV iron infusions.  Follow cbc and ferritin.        Stress    Increased stress as outlined. Discussed with her today.  On zoloft.  Overall she feels she is handling things relatively well.  Does not feel needs any further intervention.  Has good support.        Weight loss    Weight as outlined.  States she is eating.  Encourage increased po intake.  Discussed adding nutritional shakes.  Follow.          I spent 40 minutes with the patient and her son.  Time spent discussing her recent loss of consciousness, weight loss, and other concerns.  Time also spent discussing treatment, evaluation and further w/up.   Einar Pheasant, MD

## 2018-10-30 ENCOUNTER — Telehealth: Payer: Self-pay

## 2018-10-30 NOTE — Telephone Encounter (Signed)
Copied from [place] [phone]. Topic: General - Other >> Oct 30, 2018  3:17 PM Rainey Pines A wrote: Patients son would like a callback from nurse in regards to lab results for patient. Shatari Moorman can be reached at [phone]

## 2018-10-30 NOTE — Telephone Encounter (Signed)
See result note.  

## 2018-11-03 ENCOUNTER — Encounter: Payer: Self-pay | Admitting: Internal Medicine

## 2018-11-03 NOTE — Assessment & Plan Note (Signed)
Has been followed by hematology.  Has received IV iron infusions.  Follow cbc and ferritin.

## 2018-11-03 NOTE — Assessment & Plan Note (Signed)
Weight as outlined.  States she is eating.  Encourage increased po intake.  Discussed adding nutritional shakes.  Follow.

## 2018-11-03 NOTE — Assessment & Plan Note (Signed)
Blood pressure under good control.  Continue same medication regimen.  Follow pressures.  Follow metabolic panel.   

## 2018-11-03 NOTE — Assessment & Plan Note (Signed)
On pravastatin.  Follow lipid panel and liver function tests.   

## 2018-11-03 NOTE — Assessment & Plan Note (Addendum)
Had a previous episode back in 2018 where she was found down.  Had w/up in hospital that was unrevealing of etiology.  EEG - normal.  Had episode recently as outlined.  Unclear etiology.  EKG - SR/SB with no acute ischemic changes.  Discussed cardiology evaluation to confirm no cardiac etiology, given bradycardia noted on exam.  Question of need for monitor. Has been on cardizem for years.  May need to adjust medication.   Also discussed neurology evaluation.  Had this episode and similar episode in 2018.  MRI 2018 - no acute abnormality.  EEG unrevealing.  Check routine labs.  Will have neurology evaluate.  Pt and son comfortable with this plan.  On plavix.

## 2018-11-03 NOTE — Assessment & Plan Note (Signed)
Increased stress as outlined. Discussed with her today.  On zoloft.  Overall she feels she is handling things relatively well.  Does not feel needs any further intervention.  Has good support.

## 2018-11-03 NOTE — Assessment & Plan Note (Signed)
Has been on plavix. Previous MRI unrevealing.  No focal deficits noted on exam.  Refer back to neurology.

## 2018-11-05 ENCOUNTER — Ambulatory Visit: Payer: Medicare Other

## 2018-11-05 ENCOUNTER — Other Ambulatory Visit: Payer: Medicare Other

## 2018-11-13 ENCOUNTER — Telehealth: Payer: Self-pay | Admitting: *Deleted

## 2018-11-13 NOTE — Telephone Encounter (Signed)
Copied from [place] [phone]. Topic: General - Inquiry >> Nov 13, 2018 12:26 PM Virl Axe D wrote: Reason for CRM: [employer] stated they are sending over another fax for a PA for a knee brace with Attn to [facility]. Stated the first was sent on 11/06/18. Requesting CB if it is not received today. [phone]

## 2018-11-14 NOTE — Telephone Encounter (Signed)
Patient did not request this 

## 2018-11-14 NOTE — Telephone Encounter (Signed)
[employer] called regarding order for PA. Requesting it to be faxed back with an acceptance or denial (provider or patient) Please advise.

## 2018-11-14 NOTE — Telephone Encounter (Signed)
Faxed denial.

## 2018-11-19 DIAGNOSIS — H26493 Other secondary cataract, bilateral: Secondary | ICD-10-CM | POA: Diagnosis not present

## 2018-11-19 DIAGNOSIS — H353131 Nonexudative age-related macular degeneration, bilateral, early dry stage: Secondary | ICD-10-CM | POA: Diagnosis not present

## 2018-12-07 ENCOUNTER — Telehealth: Payer: Self-pay | Admitting: *Deleted

## 2018-12-07 NOTE — Telephone Encounter (Signed)
Faxed to [facility]. 

## 2018-12-07 NOTE — Telephone Encounter (Signed)
Copied from [place] [phone]. Topic: General - Inquiry >> Dec 07, 2018 10:49 AM Mathis Bud wrote: Reason for CRM: Baker Janus from [facility] clinic called stating their office needs ECG tracing  Call back [phone] FAX [phone]

## 2018-12-27 ENCOUNTER — Ambulatory Visit: Payer: Medicare Other | Admitting: Internal Medicine

## 2018-12-31 ENCOUNTER — Ambulatory Visit (INDEPENDENT_AMBULATORY_CARE_PROVIDER_SITE_OTHER): Payer: Medicare Other | Admitting: Internal Medicine

## 2018-12-31 ENCOUNTER — Other Ambulatory Visit: Payer: Self-pay

## 2018-12-31 ENCOUNTER — Encounter: Payer: Self-pay | Admitting: Internal Medicine

## 2018-12-31 DIAGNOSIS — R410 Disorientation, unspecified: Secondary | ICD-10-CM

## 2018-12-31 DIAGNOSIS — R634 Abnormal weight loss: Secondary | ICD-10-CM

## 2018-12-31 DIAGNOSIS — F439 Reaction to severe stress, unspecified: Secondary | ICD-10-CM | POA: Diagnosis not present

## 2018-12-31 DIAGNOSIS — I10 Essential (primary) hypertension: Secondary | ICD-10-CM

## 2018-12-31 DIAGNOSIS — Z8673 Personal history of transient ischemic attack (TIA), and cerebral infarction without residual deficits: Secondary | ICD-10-CM

## 2018-12-31 DIAGNOSIS — K259 Gastric ulcer, unspecified as acute or chronic, without hemorrhage or perforation: Secondary | ICD-10-CM

## 2018-12-31 DIAGNOSIS — E7800 Pure hypercholesterolemia, unspecified: Secondary | ICD-10-CM | POA: Diagnosis not present

## 2018-12-31 DIAGNOSIS — D509 Iron deficiency anemia, unspecified: Secondary | ICD-10-CM | POA: Diagnosis not present

## 2018-12-31 MED ORDER — PRAVASTATIN SODIUM 40 MG PO TABS: 40.0000 mg | ORAL_TABLET | Freq: Every day | ORAL | 1 refills | Status: DC

## 2018-12-31 MED ORDER — CLOPIDOGREL BISULFATE 75 MG PO TABS: 75.0000 mg | ORAL_TABLET | Freq: Every day | ORAL | 1 refills | Status: DC

## 2018-12-31 MED ORDER — SERTRALINE HCL 50 MG PO TABS: 50.0000 mg | ORAL_TABLET | Freq: Every day | ORAL | 1 refills | Status: DC

## 2018-12-31 MED ORDER — DILTIAZEM HCL ER COATED BEADS 120 MG PO CP24: 120.0000 mg | ORAL_CAPSULE | Freq: Every day | ORAL | 1 refills | Status: DC

## 2018-12-31 NOTE — Progress Notes (Signed)
Patient ID: Rachel Martinez, female   DOB: 11/16/1927, 85 y.o.   MRN: [id number]   Virtual Visit via telephone Note  This visit type was conducted due to national recommendations for restrictions regarding the COVID-19 pandemic (e.g. social distancing).  This format is felt to be most appropriate for this patient at this time.  All issues noted in this document were discussed and addressed.  No physical exam was performed (except for noted visual exam findings with Video Visits).   I connected with Rachel Martinez by telephone and verified that I am speaking with the correct person using two identifiers. Location patient: home Location provider: work Persons participating in the telephone visit: patient, provider  I discussed the limitations, risks, security and privacy concerns of performing an evaluation and management service by telephone and the availability of in person appointments. The patient expressed understanding and agreed to proceed.   Reason for visit: scheduled follow up.   HPI: States she is doing relatively well.  Feeling better. Has not had any other episodes of unresponsiveness.  No headache. No dizziness.  No light headedness.  No chest pain.  No sob.  No acid reflux.  No abdominal pain.  Eating well.  Bowels moving.  Increased stress.  Husband passed away in 2018/12/05.  Sister-n-law passed away recently from covid.  Her nephew is in the hospital with covid.  Overall she feels she is handling things relatively well and does not feel needs any further intervention.  Has a dog and this keeps her company.  Has not been weighing herself.  Plans to start weighing periodically.  Will also start spot checking her blood pressure.     ROS: See pertinent positives and negatives per HPI.  Past Medical History:  Diagnosis Date  . Anemia   . Chicken pox   . CVA (cerebral vascular accident) ([place]) 8/06  . Depression   . GERD (gastroesophageal reflux disease)   . Heart palpitations   .  Hypercholesteremia   . Hypertension   . Stroke [facility])     Past Surgical History:  Procedure Laterality Date  . APPENDECTOMY      Family History  Problem Relation Age of Onset  . CVA Mother   . Diabetes Father   . Heart disease Father   . Cancer Sister        breast   . Diabetes Sister   . Cancer Brother        stomach  . Leukemia Brother   . Lung disease Brother   . Hypertension Sister   . Cancer Sister        stomach  . Diabetes Sister   . Mental illness Brother        suicide  . Breast cancer Paternal Aunt   . Diabetes Son     SOCIAL HX: reviewed.    Current Outpatient Medications:  .  calcium carbonate (TUMS - DOSED IN MG ELEMENTAL CALCIUM) 500 MG chewable tablet, Chew 1 tablet by mouth 3 (three) times daily as needed. , Disp: , Rfl:  .  clopidogrel (PLAVIX) 75 MG tablet, Take 1 tablet (75 mg total) by mouth daily., Disp: 90 tablet, Rfl: 1 .  diltiazem (CARDIZEM CD) 120 MG 24 hr capsule, Take 1 capsule (120 mg total) by mouth daily., Disp: 90 capsule, Rfl: 1 .  pravastatin (PRAVACHOL) 40 MG tablet, Take 1 tablet (40 mg total) by mouth daily., Disp: 90 tablet, Rfl: 1 .  sertraline (ZOLOFT) 50 MG tablet, Take 1 tablet (  50 mg total) by mouth daily., Disp: 90 tablet, Rfl: 1  EXAM:  GENERAL: alert. Sounds to be in no acute distress.  Answering questions appropriately.   PSYCH/NEURO: pleasant and cooperative, no obvious depression or anxiety, speech and thought processing grossly intact  ASSESSMENT AND PLAN:  Discussed the following assessment and plan:  Altered mental state Had a previous episode back in 2018 where she was found down.  Had w//up in hospital that was unrevealing of etiology.  EEG - normal.  Had recent episode as outlined in last note.  Unclear etiology.  Discussed cardiology and neurology evaluation.  She initially agreed to neurology evaluation, but states she wants to hold on neurology evaluation now.  Reports she is doing well.  Wants to monitor.    Gastric ulcer Has a history of gastric ulcer.  On protonix.  Doing well.  Follow cbc.   History of CVA (cerebrovascular accident) On plavix. Declines neurology evaluation.   Hypercholesterolemia On pravastatin.  Follow lipid panel and liver function tests.    Hypertension Blood pressure has been doing well.   Follow pressures.  Follow metabolic panel.   Iron deficiency anemia Has been followed by hematology.  Has previously received iron infusions.  hgb and ferritin - wnl - 10/26/18.    Stress Increased stress as outlined.  Discussed with her today.  On zoloft.  Has good family support from her three sons.  Does not feel needs any further intervention.  Follow.    Weight loss Will start monitoring her weight.  States she is eating better.  Follow.      I discussed the assessment and treatment plan with the patient. The patient was provided an opportunity to ask questions and all were answered. The patient agreed with the plan and demonstrated an understanding of the instructions.   The patient was advised to call back or seek an in-person evaluation if the symptoms worsen or if the condition fails to improve as anticipated.  I provided 17 minutes of non-face-to-face time during this encounter.   Einar Pheasant, MD

## 2019-01-05 ENCOUNTER — Encounter: Payer: Self-pay | Admitting: Internal Medicine

## 2019-01-05 NOTE — Assessment & Plan Note (Signed)
Has a history of gastric ulcer.  On protonix.  Doing well.  Follow cbc.

## 2019-01-05 NOTE — Assessment & Plan Note (Signed)
On plavix. Declines neurology evaluation.

## 2019-01-05 NOTE — Assessment & Plan Note (Signed)
Blood pressure has been doing well.  Follow pressures.  Follow metabolic panel.   

## 2019-01-05 NOTE — Assessment & Plan Note (Signed)
On pravastatin.  Follow lipid panel and liver function tests.   

## 2019-01-05 NOTE — Assessment & Plan Note (Signed)
Will start monitoring her weight.  States she is eating better.  Follow.

## 2019-01-05 NOTE — Assessment & Plan Note (Signed)
Has been followed by hematology.  Has previously received iron infusions.  hgb and ferritin - wnl - 10/26/18.

## 2019-01-05 NOTE — Assessment & Plan Note (Signed)
Increased stress as outlined.  Discussed with her today.  On zoloft.  Has good family support from her three sons.  Does not feel needs any further intervention.  Follow.

## 2019-01-05 NOTE — Assessment & Plan Note (Signed)
Had a previous episode back in 2018 where she was found down.  Had w//up in hospital that was unrevealing of etiology.  EEG - normal.  Had recent episode as outlined in last note.  Unclear etiology.  Discussed cardiology and neurology evaluation.  She initially agreed to neurology evaluation, but states she wants to hold on neurology evaluation now.  Reports she is doing well.  Wants to monitor.

## 2019-02-22 ENCOUNTER — Other Ambulatory Visit: Payer: Self-pay | Admitting: Internal Medicine

## 2019-05-30 ENCOUNTER — Encounter (INDEPENDENT_AMBULATORY_CARE_PROVIDER_SITE_OTHER): Payer: Self-pay

## 2019-05-30 ENCOUNTER — Ambulatory Visit (INDEPENDENT_AMBULATORY_CARE_PROVIDER_SITE_OTHER): Payer: Medicare Other

## 2019-05-30 VITALS — Ht 65.0 in | Wt 104.0 lb

## 2019-05-30 DIAGNOSIS — Z0000 Encounter for general adult medical examination without abnormal findings: Secondary | ICD-10-CM | POA: Diagnosis not present

## 2019-05-30 NOTE — Patient Instructions (Addendum)
  Rachel Martinez , Thank you for taking time to come for your Medicare Wellness Visit. I appreciate your ongoing commitment to your health goals. Please review the following plan we discussed and let me know if I can assist you in the future.   These are the goals we discussed: Goals      Patient Stated   . Healthy Lifestyle  (pt-stated)     Drink plenty of fluids Eat 2 good meals daily Stay active       This is a list of the screening recommended for you and due dates:  Health Maintenance  Topic Date Due  . COVID-19 Vaccine (1) 06/15/2019*  . DEXA scan (bone density measurement)  05/29/2020*  . Flu Shot  08/25/2019  . Mammogram  Discontinued  . Tetanus Vaccine  Discontinued  . Pneumonia vaccines  Discontinued  *Topic was postponed. The date shown is not the original due date.

## 2019-05-30 NOTE — Progress Notes (Addendum)
Subjective:   Rachel Martinez is a 86 y.o. female who presents for Medicare Annual (Subsequent) preventive examination.  Review of Systems:  No ROS.  Medicare Wellness Virtual Visit.  Visual/audio telehealth visit, UTA vital signs.   Ht/Wt provided.  See social history for additional risk factors.   Cardiac Risk Factors include: advanced age (>87men, >26 women);hypertension     Objective:     Vitals: Ht 5\' 5"  (1.651 m)   Wt 104 lb (47.2 kg)   BMI 17.31 kg/m   Body mass index is 17.31 kg/m.  Advanced Directives 05/30/2019 07/18/2018 05/29/2018 05/25/2017 10/22/2016 09/28/2016 09/12/2016  Does Patient Have a Medical Advance Directive? No No Yes No No Yes No  Type of Advance Directive - - Press photographer - - Press photographer -  Does patient want to make changes to medical advance directive? - - No - Patient declined - - - -  Copy of [place] in Chart? - - No - copy requested - - - -  Would patient like information on creating a medical advance directive? No - Patient declined No - Patient declined - No - Patient declined No - Patient declined - -    Tobacco Social History   Tobacco Use  Smoking Status Former Smoker  . Packs/day: 0.25  . Years: 30.00  . Pack years: 7.50  . Quit date: 09/13/2003  . Years since quitting: 15.7  Smokeless Tobacco Never Used     Counseling given: Not Answered   Clinical Intake:  Pre-visit preparation completed: Yes        Diabetes: No  How often do you need to have someone help you when you read instructions, pamphlets, or other written materials from your doctor or pharmacy?: 1 - Never  Interpreter Needed?: No     Past Medical History:  Diagnosis Date  . Anemia   . Chicken pox   . CVA (cerebral vascular accident) ([place]) 8/06  . Depression   . GERD (gastroesophageal reflux disease)   . Heart palpitations   . Hypercholesteremia   . Hypertension   . Stroke [facility])    Past Surgical  History:  Procedure Laterality Date  . APPENDECTOMY     Family History  Problem Relation Age of Onset  . CVA Mother   . Diabetes Father   . Heart disease Father   . Cancer Sister        breast   . Diabetes Sister   . Cancer Brother        stomach  . Leukemia Brother   . Lung disease Brother   . Hypertension Sister   . Cancer Sister        stomach  . Diabetes Sister   . Mental illness Brother        suicide  . Breast cancer Paternal Aunt   . Diabetes Son    Social History   Socioeconomic History  . Marital status: Married    Spouse name: Not on file  . Number of children: Not on file  . Years of education: Not on file  . Highest education level: Not on file  Occupational History  . Not on file  Tobacco Use  . Smoking status: Former Smoker    Packs/day: 0.25    Years: 30.00    Pack years: 7.50    Quit date: 09/13/2003    Years since quitting: 15.7  . Smokeless tobacco: Never Used  Substance and Sexual Activity  .  Alcohol use: No    Alcohol/week: 0.0 standard drinks  . Drug use: No  . Sexual activity: Never  Other Topics Concern  . Not on file  Social History Narrative  . Not on file   Social Determinants of Health   Financial Resource Strain:   . Difficulty of Paying Living Expenses:   Food Insecurity:   . Worried About Charity fundraiser in the Last Year:   . Arboriculturist in the Last Year:   Transportation Needs:   . Film/video editor (Medical):   Marland Kitchen Lack of Transportation (Non-Medical):   Physical Activity:   . Days of Exercise per Week:   . Minutes of Exercise per Session:   Stress:   . Feeling of Stress :   Social Connections:   . Frequency of Communication with Friends and Family:   . Frequency of Social Gatherings with Friends and Family:   . Attends Religious Services:   . Active Member of Clubs or Organizations:   . Attends Archivist Meetings:   Marland Kitchen Marital Status:     Outpatient Encounter Medications as of 05/30/2019    Medication Sig  . calcium carbonate (TUMS - DOSED IN MG ELEMENTAL CALCIUM) 500 MG chewable tablet Chew 1 tablet by mouth 3 (three) times daily as needed.   . clopidogrel (PLAVIX) 75 MG tablet Take 1 tablet (75 mg total) by mouth daily.  Marland Kitchen diltiazem (CARDIZEM CD) 120 MG 24 hr capsule TAKE ONE CAPSULE BY MOUTH ONCE DAILY  . pravastatin (PRAVACHOL) 40 MG tablet Take 1 tablet (40 mg total) by mouth daily.  . sertraline (ZOLOFT) 50 MG tablet Take 1 tablet (50 mg total) by mouth daily.   No facility-administered encounter medications on file as of 05/30/2019.    Activities of Daily Living In your present state of health, do you have any difficulty performing the following activities: 05/30/2019  Hearing? N  Vision? N  Difficulty concentrating or making decisions? N  Walking or climbing stairs? Y  Comment Unsteady gait  Dressing or bathing? N  Doing errands, shopping? Y  Comment She does not Physiological scientist and eating ? N  Using the Toilet? N  In the past six months, have you accidently leaked urine? N  Do you have problems with loss of bowel control? N  Managing your Medications? N  Managing your Finances? Y  Comment Son Writer or managing your Housekeeping? N  Some recent data might be hidden    Patient Care Team: Einar Pheasant, MD as PCP - General (Internal Medicine)    Assessment:   This is a routine wellness examination for Rachel Martinez.  Nurse connected with patient 05/30/19 at  1:30 PM EDT by a telephone enabled telemedicine application, no virtual access and verified that I am speaking with the correct person using two identifiers. Patient stated full name and DOB. Patient gave permission to continue with virtual visit. Patient's location was at home and Nurse's location was at [place] office.   Patient is alert and oriented x3. Patient denies difficulty focusing or concentrating. Patient likes to read for brain health.  Health Maintenance Due: -Covid  vaccine- deferred, per patient preference -Dexa Scan- deferred, per patient preference See completed HM at the end of note.   Eye: Visual acuity not assessed. Virtual visit. Followed by their ophthalmologist.  Hearing: Demonstrates normal hearing during visit.  Safety:  Patient feels safe at home- yes Patient does have smoke detectors at home- yes  Patient does wear seat belt when in a moving vehicle - yes Patient drives- no Adequate lighting in walkways free from debris- yes Grab bars and handrails used as appropriate- yes Ambulates with an assistive device- yes; cane in use Life alert system - yes  Social: Alcohol intake - no Smoking history- former Smokers in home? none Illicit drug use? none  Medication: Taking as directed and without issues.  Pill box in use -yes  Self managed - yes   Covid-19: Precautions and sickness symptoms discussed. Wears mask, social distancing, hand hygiene as appropriate. [place] for patient.  Activities of Daily Living Patient denies needing assistance with: household chores, feeding themselves, getting from bed to chair, getting to the toilet, bathing/showering, dressing, or preparing meals.  Assisted by adult son with financial management.    Discussed the importance of a healthy diet, water intake and the benefits of aerobic exercise.   Physical activity- independent and active at home  Diet:  Regular. Notes appetite is good. Drinks Ensure daily. Agreeable to coupons be mailed to patient for Ensure.  Water: good intake  Other Providers Patient Care Team: Einar Pheasant, MD as PCP - General (Internal Medicine) Exercise Activities and Dietary recommendations Current Exercise Habits: Home exercise routine, Intensity: Mild  Goals      Patient Stated   . Healthy Lifestyle  (pt-stated)     Drink plenty of fluids Eat 2 good meals daily Stay active       Fall Risk Fall Risk  05/30/2019 10/15/2018 05/29/2018 05/25/2017  10/19/2016  Falls in the past year? 1 1 0 Yes Yes  Number falls in past yr: 0 0 - 1 1  Injury with Fall? 0 1 - (No Data) No  Comment Foot was caught on item while walking, fell on knee, denies being hurt. Discussed keeping walkways clear. Declines follow up for this fall 2 weeks ago. - - She does not know why she fell. Tried to turn around, fell and bumped her head 1 month ago.  She didn't seek medical attention.  No headaches. No tender to touch places. One raised knot reported which quickly resolved itself. -  Risk for fall due to : - - - - -  Risk for fall due to: Comment - - - - -  Follow up Falls evaluation completed Falls evaluation completed - Falls prevention discussed;Education provided Falls prevention discussed  Comment - - - Use walker/cane when ambulating -    Timed Get Up and Go performed: no, virtual visit  Depression Screen PHQ 2/9 Scores 05/30/2019 10/15/2018 05/29/2018 05/25/2017  PHQ - 2 Score 0 0 0 0  PHQ- 9 Score - - - -     Cognitive Function     6CIT Screen 05/30/2019 05/29/2018 05/25/2017 05/24/2016  What Year? 0 points 0 points 0 points 0 points  What month? 3 points 0 points 0 points 0 points  What time? 0 points 0 points 0 points 0 points  Count back from 20 0 points 0 points 0 points 0 points  Months in reverse - 0 points 0 points 0 points  Repeat phrase - 0 points 0 points 0 points  Total Score - 0 0 0    Immunization History  Administered Date(s) Administered  . Fluad Quad(high Dose 65+) 10/26/2018  . Influenza Split 11/23/2011, 11/12/2012  . Influenza, High Dose Seasonal PF 10/29/2015, 10/23/2016, 12/08/2017  . Influenza,inj,Quad PF,6+ Mos 11/20/2013  . Pneumococcal Polysaccharide-23 10/23/2016   Screening Tests Health Maintenance  Topic  Date Due  . COVID-19 Vaccine (1) 06/15/2019 (Originally 03/10/1943)  . DEXA SCAN  05/29/2020 (Originally 03/10/1992)  . INFLUENZA VACCINE  08/25/2019  . MAMMOGRAM  Discontinued  . TETANUS/TDAP  Discontinued  . PNA vac Low  Risk Adult  Discontinued      Plan:   Keep all routine maintenance appointments.   Medicare Attestation I have personally reviewed: The patient's medical and social history Their use of alcohol, tobacco or illicit drugs Their current medications and supplements The patient's functional ability including ADLs,fall risks, home safety risks, cognitive, and hearing and visual impairment Diet and physical activities Evidence for depression   I have reviewed and discussed with patient certain preventive protocols, quality metrics, and best practice recommendations.      Varney Biles, LPN  [id number]   Reviewed above information.  Agree with assessment and plan.    Dr Nicki Reaper

## 2019-06-17 DIAGNOSIS — M7581 Other shoulder lesions, right shoulder: Secondary | ICD-10-CM | POA: Diagnosis not present

## 2019-08-02 ENCOUNTER — Telehealth: Payer: Self-pay | Admitting: Internal Medicine

## 2019-08-02 NOTE — Telephone Encounter (Signed)
Letter printed and signed and  placed in box.   

## 2019-08-05 NOTE — Telephone Encounter (Signed)
Called patients grand daughter regarding letter. LMTCB

## 2019-08-05 NOTE — Telephone Encounter (Signed)
Called patients grand daughter to let her know that letter has been completed. I will place up front for pick up once I return to the office. Jarrett Soho will be coming to pick up around 1:00 tomorrow.

## 2019-08-05 NOTE — Telephone Encounter (Signed)
Jarrett Soho returned your call.

## 2019-08-06 NOTE — Telephone Encounter (Signed)
Letter placed up front  

## 2020-01-06 ENCOUNTER — Other Ambulatory Visit: Payer: Self-pay

## 2020-01-06 ENCOUNTER — Ambulatory Visit (INDEPENDENT_AMBULATORY_CARE_PROVIDER_SITE_OTHER): Payer: Medicare Other | Admitting: Internal Medicine

## 2020-01-06 ENCOUNTER — Other Ambulatory Visit: Payer: Self-pay | Admitting: Internal Medicine

## 2020-01-06 VITALS — BP 126/72 | HR 61 | Temp 98.2°F | Resp 16 | Ht 65.0 in | Wt 98.6 lb

## 2020-01-06 DIAGNOSIS — F439 Reaction to severe stress, unspecified: Secondary | ICD-10-CM

## 2020-01-06 DIAGNOSIS — I10 Essential (primary) hypertension: Secondary | ICD-10-CM | POA: Diagnosis not present

## 2020-01-06 DIAGNOSIS — E7800 Pure hypercholesterolemia, unspecified: Secondary | ICD-10-CM

## 2020-01-06 DIAGNOSIS — R2681 Unsteadiness on feet: Secondary | ICD-10-CM

## 2020-01-06 DIAGNOSIS — Z8673 Personal history of transient ischemic attack (TIA), and cerebral infarction without residual deficits: Secondary | ICD-10-CM

## 2020-01-06 DIAGNOSIS — R634 Abnormal weight loss: Secondary | ICD-10-CM | POA: Diagnosis not present

## 2020-01-06 DIAGNOSIS — D509 Iron deficiency anemia, unspecified: Secondary | ICD-10-CM

## 2020-01-06 DIAGNOSIS — R944 Abnormal results of kidney function studies: Secondary | ICD-10-CM

## 2020-01-06 LAB — IBC + FERRITIN
Ferritin: 33.4 ng/mL (ref 10.0–291.0)
Iron: 65 ug/dL (ref 42–145)
Saturation Ratios: 19.3 % — ABNORMAL LOW (ref 20.0–50.0)
Transferrin: 240 mg/dL (ref 212.0–360.0)

## 2020-01-06 LAB — LIPID PANEL
Cholesterol: 227 mg/dL — ABNORMAL HIGH (ref 0–200)
HDL: 63 mg/dL (ref >39.00)
LDL Cholesterol: 129 mg/dL — ABNORMAL HIGH (ref 0–99)
NonHDL: 163.69
Total CHOL/HDL Ratio: 4
Triglycerides: 171 mg/dL — ABNORMAL HIGH (ref 0.0–149.0)
VLDL: 34.2 mg/dL (ref 0.0–40.0)

## 2020-01-06 LAB — COMPREHENSIVE METABOLIC PANEL
ALT: 6 U/L (ref 0–35)
AST: 20 U/L (ref 0–37)
Albumin: 4.2 g/dL (ref 3.5–5.2)
Alkaline Phosphatase: 59 U/L (ref 39–117)
BUN: 17 mg/dL (ref 6–23)
CO2: 31 mEq/L (ref 19–32)
Calcium: 9.5 mg/dL (ref 8.4–10.5)
Chloride: 103 mEq/L (ref 96–112)
Creatinine, Ser: 1.19 mg/dL (ref 0.40–1.20)
GFR: 40.16 mL/min — ABNORMAL LOW (ref >60.00)
Glucose, Bld: 82 mg/dL (ref 70–99)
Potassium: 4.2 mEq/L (ref 3.5–5.1)
Sodium: 141 mEq/L (ref 135–145)
Total Bilirubin: 0.5 mg/dL (ref 0.2–1.2)
Total Protein: 6.4 g/dL (ref 6.0–8.3)

## 2020-01-06 LAB — CBC WITH DIFFERENTIAL/PLATELET
Basophils Absolute: 0 10*3/uL (ref 0.0–0.1)
Basophils Relative: 0.8 % (ref 0.0–3.0)
Eosinophils Absolute: 0 10*3/uL (ref 0.0–0.7)
Eosinophils Relative: 0.7 % (ref 0.0–5.0)
HCT: 36.9 % (ref 36.0–46.0)
Hemoglobin: 12.4 g/dL (ref 12.0–15.0)
Lymphocytes Relative: 25.1 % (ref 12.0–46.0)
Lymphs Abs: 1.5 10*3/uL (ref 0.7–4.0)
MCHC: 33.6 g/dL (ref 30.0–36.0)
MCV: 90.8 fl (ref 78.0–100.0)
Monocytes Absolute: 0.3 10*3/uL (ref 0.1–1.0)
Monocytes Relative: 5.6 % (ref 3.0–12.0)
Neutro Abs: 4.2 10*3/uL (ref 1.4–7.7)
Neutrophils Relative %: 67.8 % (ref 43.0–77.0)
Platelets: 205 10*3/uL (ref 150.0–400.0)
RBC: 4.06 Mil/uL (ref 3.87–5.11)
RDW: 14.4 % (ref 11.5–15.5)
WBC: 6.1 10*3/uL (ref 4.0–10.5)

## 2020-01-06 LAB — TSH: TSH: 1.68 u[IU]/mL (ref 0.35–4.50)

## 2020-01-06 NOTE — Progress Notes (Signed)
Order placed for f/u met b 

## 2020-01-06 NOTE — Progress Notes (Signed)
Patient ID: Rachel Martinez, female   DOB: 16-Apr-1927, 86 y.o.   MRN: [id number]   Subjective:    Patient ID: Rachel Martinez, female    DOB: 1927/06/11, 86 y.o.   MRN: [id number]  HPI This visit occurred during the SARS-CoV-2 public health emergency.  Safety protocols were in place, including screening questions prior to the visit, additional usage of staff PPE, and extensive cleaning of exam room while observing appropriate contact time as indicated for disinfecting solutions.  Patient here for a scheduled follow up.  She is accompanied by her son.  History obtained from both of them.  She is doing relatively well.   Has seen ortho previously for right shoulder tendinitis.  S/;p previous injection.  Helps.  Unsteady at times.  Discussed walker.  rx given for walker.  No chest pain or sob reported.  No abdominal pain reported.  Discussed miralax.  Discussed diet and adding nutritional shakes.    Past Medical History:  Diagnosis Date  . Anemia   . Chicken pox   . CVA (cerebral vascular accident) ([place]) 8/06  . Depression   . GERD (gastroesophageal reflux disease)   . Heart palpitations   . Hypercholesteremia   . Hypertension   . Stroke [facility])    Past Surgical History:  Procedure Laterality Date  . APPENDECTOMY     Family History  Problem Relation Age of Onset  . CVA Mother   . Diabetes Father   . Heart disease Father   . Cancer Sister        breast   . Diabetes Sister   . Cancer Brother        stomach  . Leukemia Brother   . Lung disease Brother   . Hypertension Sister   . Cancer Sister        stomach  . Diabetes Sister   . Mental illness Brother        suicide  . Breast cancer Paternal Aunt   . Diabetes Son    Social History   Socioeconomic History  . Marital status: Married    Spouse name: Not on file  . Number of children: Not on file  . Years of education: Not on file  . Highest education level: Not on file  Occupational History  . Not on file  Tobacco Use  .  Smoking status: Former Smoker    Packs/day: 0.25    Years: 30.00    Pack years: 7.50    Quit date: 09/13/2003    Years since quitting: 16.3  . Smokeless tobacco: Never Used  Vaping Use  . Vaping Use: Never used  Substance and Sexual Activity  . Alcohol use: No    Alcohol/week: 0.0 standard drinks  . Drug use: No  . Sexual activity: Never  Other Topics Concern  . Not on file  Social History Narrative  . Not on file   Social Determinants of Health   Financial Resource Strain: Not on file  Food Insecurity: Not on file  Transportation Needs: Not on file  Physical Activity: Not on file  Stress: Not on file  Social Connections: Not on file    Outpatient Encounter Medications as of 01/06/2020  Medication Sig  . calcium carbonate (TUMS - DOSED IN MG ELEMENTAL CALCIUM) 500 MG chewable tablet Chew 1 tablet by mouth 3 (three) times daily as needed.   . clopidogrel (PLAVIX) 75 MG tablet Take 1 tablet (75 mg total) by mouth daily.  Marland Kitchen diltiazem (CARDIZEM CD)  120 MG 24 hr capsule TAKE ONE CAPSULE BY MOUTH ONCE DAILY  . pravastatin (PRAVACHOL) 40 MG tablet Take 1 tablet (40 mg total) by mouth daily.  . sertraline (ZOLOFT) 50 MG tablet Take 1 tablet (50 mg total) by mouth daily.   No facility-administered encounter medications on file as of 01/06/2020.   Review of Systems  Constitutional: Negative for appetite change and unexpected weight change.  HENT: Negative for congestion and sinus pressure.   Respiratory: Negative for cough, chest tightness and shortness of breath.   Cardiovascular: Negative for chest pain, palpitations and leg swelling.  Gastrointestinal: Negative for abdominal pain, diarrhea, nausea and vomiting.  Genitourinary: Negative for difficulty urinating and dysuria.  Skin: Negative for color change and rash.  Neurological: Negative for dizziness, light-headedness and headaches.  Psychiatric/Behavioral: Negative for agitation and dysphoric mood.       Objective:     Physical Exam Vitals reviewed.  Constitutional:      General: She is not in acute distress.    Appearance: Normal appearance.  HENT:     Head: Normocephalic and atraumatic.     Right Ear: External ear normal.     Left Ear: External ear normal.     Mouth/Throat:     Mouth: Oropharynx is clear and moist.  Eyes:     General: No scleral icterus.       Right eye: No discharge.        Left eye: No discharge.     Conjunctiva/sclera: Conjunctivae normal.  Neck:     Thyroid: No thyromegaly.  Cardiovascular:     Rate and Rhythm: Normal rate and regular rhythm.  Pulmonary:     Effort: No respiratory distress.     Breath sounds: Normal breath sounds. No wheezing.  Abdominal:     General: Bowel sounds are normal.     Palpations: Abdomen is soft.     Tenderness: There is no abdominal tenderness.  Musculoskeletal:        General: No swelling, tenderness or edema.     Cervical back: Neck supple. No tenderness.  Lymphadenopathy:     Cervical: No cervical adenopathy.  Skin:    Findings: No erythema or rash.  Neurological:     Mental Status: She is alert.  Psychiatric:        Mood and Affect: Mood normal.        Behavior: Behavior normal.     BP 126/72   Pulse 61   Temp 98.2 F (36.8 C) (Oral)   Resp 16   Ht 5\' 5"  (1.651 m)   Wt 98 lb 9.6 oz (44.7 kg)   SpO2 98%   BMI 16.41 kg/m  Wt Readings from Last 3 Encounters:  01/06/20 98 lb 9.6 oz (44.7 kg)  05/30/19 104 lb (47.2 kg)  12/31/18 104 lb (47.2 kg)     Lab Results  Component Value Date   WBC 6.1 01/06/2020   HGB 12.4 01/06/2020   HCT 36.9 01/06/2020   PLT 205.0 01/06/2020   GLUCOSE 82 01/06/2020   CHOL 227 (H) 01/06/2020   TRIG 171.0 (H) 01/06/2020   HDL 63.00 01/06/2020   LDLCALC 129 (H) 01/06/2020   ALT 6 01/06/2020   AST 20 01/06/2020   NA 141 01/06/2020   K 4.2 01/06/2020   CL 103 01/06/2020   CREATININE 1.19 01/06/2020   BUN 17 01/06/2020   CO2 31 01/06/2020   TSH 1.68 01/06/2020   HGBA1C 5.4  10/29/2018    CT Head  Wo Contrast  Result Date: 07/18/2018 CLINICAL DATA:  Fall last night.  Head injury. EXAM: CT HEAD WITHOUT CONTRAST CT CERVICAL SPINE WITHOUT CONTRAST TECHNIQUE: Multidetector CT imaging of the head and cervical spine was performed following the standard protocol without intravenous contrast. Multiplanar CT image reconstructions of the cervical spine were also generated. COMPARISON:  CT head 10/22/2016.  MRI brain 10/22/2016. FINDINGS: CT HEAD FINDINGS Brain: There is no evidence of acute intracranial hemorrhage, mass lesion, brain edema or extra-axial fluid collection. There is stable mild atrophy with prominence of the ventricles and subarachnoid spaces. Patchy and confluent low-density is present in the periventricular and subcortical white matter, similar to previous study. There is no CT evidence of acute cortical infarction. Vascular: Prominent intracranial vascular calcifications. No hyperdense vessel identified. Skull: Negative for fracture or focal lesion. 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 The paranasal sinuses are otherwise clear without air-fluid levels. The mastoid air cells and middle ears are clear. Previous bilateral lens surgery without acute orbital findings. Other: Right parietal scalp soft tissue injury. CT CERVICAL SPINE FINDINGS Alignment: Normal. Skull base and vertebrae: No evidence of acute fracture or traumatic subluxation. The posterior arch of C1 is incomplete. Soft tissues and spinal canal: No prevertebral fluid or swelling. No visible canal hematoma. Disc levels: Mild multilevel spondylosis for age with mild uncinate spurring and facet hypertrophy. No large disc herniation or high-grade spinal stenosis identified. Upper chest: Mild biapical scarring. Other: None. IMPRESSION: 1. Right parietal scalp soft tissue injury without evidence of calvarial fracture. 2. No acute intracranial findings.  Stable atrophy and chronic small vessel ischemic changes. 3. No evidence of acute cervical spine fracture, traumatic subluxation or static signs of instability. Mild cervical spondylosis. Electronically Signed   By: Richardean Sale M.D.   On: 07/18/2018 20:38   CT Cervical Spine Wo Contrast  Result Date: 07/18/2018 CLINICAL DATA:  Fall last night.  Head injury. EXAM: CT HEAD WITHOUT CONTRAST CT CERVICAL SPINE WITHOUT CONTRAST TECHNIQUE: Multidetector CT imaging of the head and cervical spine was performed following the standard protocol without intravenous contrast. Multiplanar CT image reconstructions of the cervical spine were also generated. COMPARISON:  CT head 10/22/2016.  MRI brain 10/22/2016. FINDINGS: CT HEAD FINDINGS Brain: There is no evidence of acute intracranial hemorrhage, mass lesion, brain edema or extra-axial fluid collection. There is stable mild atrophy with prominence of the ventricles and subarachnoid spaces. Patchy and confluent low-density is present in the periventricular and subcortical white matter, similar to previous study. There is no CT evidence of acute cortical infarction. Vascular: Prominent intracranial vascular calcifications. No hyperdense vessel identified. Skull: Negative for fracture or focal lesion. 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 The paranasal sinuses are otherwise clear without air-fluid levels. The mastoid air cells and middle ears are clear. Previous bilateral lens surgery without acute orbital findings. Other: Right parietal scalp soft tissue injury. CT CERVICAL SPINE FINDINGS Alignment: Normal. Skull base and vertebrae: No evidence of acute fracture or traumatic subluxation. The posterior arch of C1 is incomplete. Soft tissues and spinal canal: No prevertebral fluid or swelling. No visible canal hematoma. Disc levels: Mild multilevel spondylosis for age with mild uncinate spurring and facet  hypertrophy. No large disc herniation or high-grade spinal stenosis identified. Upper chest: Mild biapical scarring. Other: None. IMPRESSION: 1. Right parietal scalp soft tissue injury without evidence of calvarial fracture. 2. No acute intracranial findings. Stable atrophy and  chronic small vessel ischemic changes. 3. No evidence of acute cervical spine fracture, traumatic subluxation or static signs of instability. Mild cervical spondylosis. Electronically Signed   By: Richardean Sale M.D.   On: 07/18/2018 20:38       Assessment & Plan:   Problem List Items Addressed This Visit    History of CVA (cerebrovascular accident)    continue plavix.       Hypertension    Blood pressure as outlined.  On diltiazem.  Follow pressures.  Follow metabolic panel.       Hypercholesterolemia    On pravastatin.  Follow lipid panel and liver function tests.        Relevant Orders   CBC with Differential/Platelet (Completed)   Comprehensive metabolic panel (Completed)   TSH (Completed)   Lipid panel (Completed)   Iron deficiency anemia    Has seen hematology.  Has previously received iron infusions.  Follow cbc and ferritin.        Relevant Orders   IBC + Ferritin (Completed)   Stress    Good family support.  Continue zoloft.  Stable.       Weight loss    Discussed increased po intake.  She is eating.  No nausea/vomiting or abdominal pain.  Encourage nutritional shakes.  Follow.        Other Visit Diagnoses    Unsteady gait    -  Primary   Relevant Orders   For home use only DME Other see comment       Einar Pheasant, MD

## 2020-01-12 ENCOUNTER — Encounter: Payer: Self-pay | Admitting: Internal Medicine

## 2020-01-12 NOTE — Assessment & Plan Note (Signed)
On pravastatin.  Follow lipid panel and liver function tests.   

## 2020-01-12 NOTE — Assessment & Plan Note (Signed)
Discussed increased po intake.  She is eating.  No nausea/vomiting or abdominal pain.  Encourage nutritional shakes.  Follow.

## 2020-01-12 NOTE — Assessment & Plan Note (Signed)
Has seen hematology.  Has previously received iron infusions.  Follow cbc and ferritin.

## 2020-01-12 NOTE — Assessment & Plan Note (Signed)
Good family support.  Continue zoloft.  Stable.

## 2020-01-12 NOTE — Assessment & Plan Note (Signed)
Blood pressure as outlined.  On diltiazem.  Follow pressures.  Follow metabolic panel.

## 2020-01-12 NOTE — Assessment & Plan Note (Signed)
- 

## 2020-06-01 ENCOUNTER — Ambulatory Visit (INDEPENDENT_AMBULATORY_CARE_PROVIDER_SITE_OTHER): Payer: Medicare Other

## 2020-06-01 VITALS — Ht 65.0 in | Wt 98.0 lb

## 2020-06-01 DIAGNOSIS — Z0000 Encounter for general adult medical examination without abnormal findings: Secondary | ICD-10-CM

## 2020-06-01 NOTE — Progress Notes (Signed)
Subjective:   Rachel Martinez is a 87 y.o. female who presents for Medicare Annual (Subsequent) preventive examination.  Review of Systems    No ROS.  Medicare Wellness Virtual Visit.  Visual/audio telehealth visit, UTA vital signs.   See social history for additional risk factors.   Cardiac Risk Factors include: advanced age (>74men, >55 women)     Objective:    Today's Vitals   06/01/20 1322  Weight: 98 lb (44.5 kg)  Height: 5\' 5"  (1.651 m)   Body mass index is 16.31 kg/m.  Advanced Directives 06/01/2020 05/30/2019 07/18/2018 05/29/2018 05/25/2017 10/22/2016 09/28/2016  Does Patient Have a Medical Advance Directive? No No No Yes No No Yes  Type of Advance Directive - - - Press photographer - - Press photographer  Does patient want to make changes to medical advance directive? - - - No - Patient declined - - -  Copy of [place] in Chart? - - - No - copy requested - - -  Would patient like information on creating a medical advance directive? No - Patient declined No - Patient declined No - Patient declined - No - Patient declined No - Patient declined -    Current Medications (verified) Outpatient Encounter Medications as of 06/01/2020  Medication Sig  . calcium carbonate (TUMS - DOSED IN MG ELEMENTAL CALCIUM) 500 MG chewable tablet Chew 1 tablet by mouth 3 (three) times daily as needed.   . clopidogrel (PLAVIX) 75 MG tablet Take 1 tablet (75 mg total) by mouth daily.  Marland Kitchen diltiazem (CARDIZEM CD) 120 MG 24 hr capsule TAKE ONE CAPSULE BY MOUTH ONCE DAILY  . pravastatin (PRAVACHOL) 40 MG tablet Take 1 tablet (40 mg total) by mouth daily.  . sertraline (ZOLOFT) 50 MG tablet Take 1 tablet (50 mg total) by mouth daily.   No facility-administered encounter medications on file as of 06/01/2020.    Allergies (verified) Amoxicillin-pot clavulanate, Ciprofloxacin, Codeine sulfate, Imipenem, and Metronidazole   History: Past Medical History:  Diagnosis  Date  . Anemia   . Chicken pox   . CVA (cerebral vascular accident) ([place]) 8/06  . Depression   . GERD (gastroesophageal reflux disease)   . Heart palpitations   . Hypercholesteremia   . Hypertension   . Stroke [facility])    Past Surgical History:  Procedure Laterality Date  . APPENDECTOMY     Family History  Problem Relation Age of Onset  . CVA Mother   . Diabetes Father   . Heart disease Father   . Cancer Sister        breast   . Diabetes Sister   . Cancer Brother        stomach  . Leukemia Brother   . Lung disease Brother   . Hypertension Sister   . Cancer Sister        stomach  . Diabetes Sister   . Mental illness Brother        suicide  . Breast cancer Paternal Aunt   . Diabetes Son    Social History   Socioeconomic History  . Marital status: Married    Spouse name: Not on file  . Number of children: Not on file  . Years of education: Not on file  . Highest education level: Not on file  Occupational History  . Not on file  Tobacco Use  . Smoking status: Former Smoker    Packs/day: 0.25    Years: 30.00  Pack years: 7.50    Quit date: 09/13/2003    Years since quitting: 16.7  . Smokeless tobacco: Never Used  Vaping Use  . Vaping Use: Never used  Substance and Sexual Activity  . Alcohol use: No    Alcohol/week: 0.0 standard drinks  . Drug use: No  . Sexual activity: Never  Other Topics Concern  . Not on file  Social History Narrative  . Not on file   Social Determinants of Health   Financial Resource Strain: Low Risk   . Difficulty of Paying Living Expenses: Not hard at all  Food Insecurity: No Food Insecurity  . Worried About Charity fundraiser in the Last Year: Never true  . Ran Out of Food in the Last Year: Never true  Transportation Needs: No Transportation Needs  . Lack of Transportation (Medical): No  . Lack of Transportation (Non-Medical): No  Physical Activity: Not on file  Stress: No Stress Concern Present  . Feeling of Stress : Not  at all  Social Connections: Unknown  . Frequency of Communication with Friends and Family: More than three times a week  . Frequency of Social Gatherings with Friends and Family: Not on file  . Attends Religious Services: Not on file  . Active Member of Clubs or Organizations: Not on file  . Attends Archivist Meetings: Not on file  . Marital Status: Not on file    Tobacco Counseling Counseling given: Not Answered   Clinical Intake:  Pre-visit preparation completed: Yes        Diabetes: No  How often do you need to have someone help you when you read instructions, pamphlets, or other written materials from your doctor or pharmacy?: 1 - Never    Interpreter Needed?: No      Activities of Daily Living In your present state of health, do you have any difficulty performing the following activities: 06/01/2020  Hearing? N  Vision? N  Difficulty concentrating or making decisions? Y  Comment Notes some difficulty with memory.  Walking or climbing stairs? Y  Comment Unsteady gait. Cane/walker in use.  Dressing or bathing? N  Doing errands, shopping? Y  Preparing Food and eating ? N  Using the Toilet? N  In the past six months, have you accidently leaked urine? N  Do you have problems with loss of bowel control? N  Managing your Medications? N  Managing your Finances? Y  Comment Son Writer or managing your Housekeeping? Y  Comment Son assist as needed  Some recent data might be hidden    Patient Care Team: Einar Pheasant, MD as PCP - General (Internal Medicine)  Indicate any recent Medical Services you may have received from other than [facility] providers in the past year (date may be approximate).     Assessment:   This is a routine wellness examination for [name].  I connected with Larayne today by telephone and verified that I am speaking with the correct person using two identifiers. Location patient: home Location provider: work Persons  participating in the virtual visit: patient, son and Marine scientist.    I discussed the limitations, risks, security and privacy concerns of performing an evaluation and management service by telephone and the availability of in person appointments. The patient expressed understanding and verbally consented to this telephonic visit.    Interactive audio and video telecommunications were attempted between this provider and patient, however failed, due to patient having technical difficulties OR patient did not have access  to video capability.  We continued and completed visit with audio only.  Some vital signs may be absent or patient reported.   Hearing/Vision screen  Hearing Screening   125Hz  250Hz  500Hz  1000Hz  2000Hz  3000Hz  4000Hz  6000Hz  8000Hz   Right ear:           Left ear:           Comments: Patient is able to hear conversational tones without difficulty.  No issues reported.  Vision Screening Comments: Wears corrective lenses Visual acuity not assessed, virtual visit.  They have seen their ophthalmologist in the last 12 months.     Dietary issues and exercise activities discussed: Current Exercise Habits: Home exercise routine, Type of exercise: walking, Intensity: Mild  Healthy diet Good water intake  Goals Addressed              This Visit's Progress     Patient Stated   .  Healthy Lifestyle  (pt-stated)   On track     Drink plenty of fluids Eat 2 good meals daily Stay active      Depression Screen PHQ 2/9 Scores 06/01/2020 05/30/2019 10/15/2018 05/29/2018 05/25/2017 05/24/2016 10/29/2015  PHQ - 2 Score 0 0 0 0 0 0 0  PHQ- 9 Score - - - - - 0 -    Fall Risk Fall Risk  06/01/2020 05/30/2019 10/15/2018 05/29/2018 05/25/2017  Falls in the past year? 0 1 1 0 Yes  Number falls in past yr: 0 0 0 - 1  Injury with Fall? 0 0 1 - (No Data)  Comment - Foot was caught on item while walking, fell on knee, denies being hurt. Discussed keeping walkways clear. Declines follow up for this fall 2 weeks  ago. - - She does not know why she fell. Tried to turn around, fell and bumped her head 1 month ago.  She didn't seek medical attention.  No headaches. No tender to touch places. One raised knot reported which quickly resolved itself.  Risk for fall due to : - - - - -  Risk for fall due to: Comment - - - - -  Follow up Falls evaluation completed Falls evaluation completed Falls evaluation completed - Falls prevention discussed;Education provided  Comment - - - - Use walker/cane when ambulating    FALL RISK PREVENTION PERTAINING TO THE HOME: Handrails in use when climbing stairs? Yes Home free of loose throw rugs in walkways, pet beds, electrical cords, etc? Yes  Adequate lighting in your home to reduce risk of falls? Yes   ASSISTIVE DEVICES UTILIZED TO PREVENT FALLS: Life alert? Yes  Use of a cane, walker or w/c? Yes  Grab bars in the bathroom? Yes  Shower chair or bench in shower? Yes  Elevated toilet seat or a handicapped toilet? Yes   TIMED UP AND GO: Was the test performed? No . Virtual visit.   Cognitive Function:  Patient is alert.  Unable to perform MMSE/6CIT.   6CIT Screen 05/30/2019 05/29/2018 05/25/2017 05/24/2016  What Year? 0 points 0 points 0 points 0 points  What month? 3 points 0 points 0 points 0 points  What time? 0 points 0 points 0 points 0 points  Count back from 20 0 points 0 points 0 points 0 points  Months in reverse - 0 points 0 points 0 points  Repeat phrase - 0 points 0 points 0 points  Total Score - 0 0 0    Immunizations Immunization History  Administered Date(s) Administered  .  Fluad Quad(high Dose 65+) 10/26/2018  . Influenza Split 11/23/2011, 11/12/2012  . Influenza, High Dose Seasonal PF 10/29/2015, 10/23/2016, 12/08/2017  . Influenza,inj,Quad PF,6+ Mos 11/20/2013  . PFIZER(Purple Top)SARS-COV-2 Vaccination 11/20/2019, 12/13/2019  . Pneumococcal Polysaccharide-23 10/23/2016   Health Maintenance Health Maintenance  Topic Date Due  . DEXA SCAN   06/01/2021 (Originally 03/10/1994)  . COVID-19 Vaccine (3 - Booster for Pfizer series) 06/11/2020  . INFLUENZA VACCINE  08/24/2020  . HPV VACCINES  Aged Out  . MAMMOGRAM  Discontinued  . TETANUS/TDAP  Discontinued  . PNA vac Low Risk Adult  Discontinued   Colorectal cancer screening: No longer required.   Lung Cancer Screening: (Low Dose CT Chest recommended if Age 57-80 years, 30 pack-year currently smoking OR have quit w/in 15years.) does not qualify.   Vision Screening: Recommended annual ophthalmology exams for early detection of glaucoma and other disorders of the eye. Is the patient up to date with their annual eye exam?  Yes   Dental Screening: Recommended annual dental exams for proper oral hygiene.  Community Resource Referral / Chronic Care Management: CRR required this visit?  Yes   CCM required this visit?  Yes      Plan:   Keep all routine maintenance appointments.   Follow up 06/11/20 @ 11:00.  Conditions/risks identified: Son requests medication for assistance with patient memory loss. Follow up appointment scheduled.    I have personally reviewed and noted the following in the patient's chart:   . Medical and social history . Use of alcohol, tobacco or illicit drugs  . Current medications and supplements including opioid prescriptions.  . Functional ability and status . Nutritional status . Physical activity . Advanced directives . List of other physicians . Hospitalizations, surgeries, and ER visits in previous 12 months . Vitals . Screenings to include cognitive, depression, and falls . Referrals and appointments  In addition, I have reviewed and discussed with patient certain preventive protocols, quality metrics, and best practice recommendations. A written personalized care plan for preventive services as well as general preventive health recommendations were provided to patient.     Varney Biles, LPN   [id number]

## 2020-06-01 NOTE — Patient Instructions (Addendum)
Rachel Martinez , Thank you for taking time to come for your Medicare Wellness Visit. I appreciate your ongoing commitment to your health goals. Please review the following plan we discussed and let me know if I can assist you in the future.   These are the goals we discussed: Goals      Patient Stated   .  Healthy Lifestyle  (pt-stated)      Drink plenty of fluids Eat 2 good meals daily Stay active       This is a list of the screening recommended for you and due dates:  Health Maintenance  Topic Date Due  . DEXA scan (bone density measurement)  06/01/2021*  . COVID-19 Vaccine (3 - Booster for Pfizer series) 06/11/2020  . Flu Shot  08/24/2020  . HPV Vaccine  Aged Out  . Mammogram  Discontinued  . Tetanus Vaccine  Discontinued  . Pneumonia vaccines  Discontinued  *Topic was postponed. The date shown is not the original due date.   Immunizations Immunization History  Administered Date(s) Administered  . Fluad Quad(high Dose 65+) 10/26/2018  . Influenza Split 11/23/2011, 11/12/2012  . Influenza, High Dose Seasonal PF 10/29/2015, 10/23/2016, 12/08/2017  . Influenza,inj,Quad PF,6+ Mos 11/20/2013  . PFIZER(Purple Top)SARS-COV-2 Vaccination 11/20/2019, 12/13/2019  . Pneumococcal Polysaccharide-23 10/23/2016   Keep all routine maintenance appointments.   Follow up 06/11/20 @ 11:00  Advanced directives: not yet completed.   Conditions/risks identified: Son requests medication for assistance with patient memory loss. Follow up appointment scheduled.   Follow up in one year for your annual wellness visit   Preventive Care 85 Years and Older, Female Preventive care refers to lifestyle choices and visits with your health care provider that can promote health and wellness. What does preventive care include?  A yearly physical exam. This is also called an annual well check.  Dental exams once or twice a year.  Routine eye exams. Ask your health care provider how often you should  have your eyes checked.  Personal lifestyle choices, including:  Daily care of your teeth and gums.  Regular physical activity.  Eating a healthy diet.  Avoiding tobacco and drug use.  Limiting alcohol use.  Practicing safe sex.  Taking low-dose aspirin every day.  Taking vitamin and mineral supplements as recommended by your health care provider. What happens during an annual well check? The services and screenings done by your health care provider during your annual well check will depend on your age, overall health, lifestyle risk factors, and family history of disease. Counseling  Your health care provider may ask you questions about your:  Alcohol use.  Tobacco use.  Drug use.  Emotional well-being.  Home and relationship well-being.  Sexual activity.  Eating habits.  History of falls.  Memory and ability to understand (cognition).  Work and work Statistician.  Reproductive health. Screening  You may have the following tests or measurements:  Height, weight, and BMI.  Blood pressure.  Lipid and cholesterol levels. These may be checked every 5 years, or more frequently if you are over 60 years old.  Skin check.  Lung cancer screening. You may have this screening every year starting at age 57 if you have a 30-pack-year history of smoking and currently smoke or have quit within the past 15 years.  Fecal occult blood test (FOBT) of the stool. You may have this test every year starting at age 9.  Flexible sigmoidoscopy or colonoscopy. You may have a sigmoidoscopy every 5 years or  a colonoscopy every 10 years starting at age 47.  Hepatitis C blood test.  Hepatitis B blood test.  Sexually transmitted disease (STD) testing.  Diabetes screening. This is done by checking your blood sugar (glucose) after you have not eaten for a while (fasting). You may have this done every 1-3 years.  Bone density scan. This is done to screen for osteoporosis. You may  have this done starting at age 40.  Mammogram. This may be done every 1-2 years. Talk to your health care provider about how often you should have regular mammograms. Talk with your health care provider about your test results, treatment options, and if necessary, the need for more tests. Vaccines  Your health care provider may recommend certain vaccines, such as:  Influenza vaccine. This is recommended every year.  Tetanus, diphtheria, and acellular pertussis (Tdap, Td) vaccine. You may need a Td booster every 10 years.  Zoster vaccine. You may need this after age 58.  Pneumococcal 13-valent conjugate (PCV13) vaccine. One dose is recommended after age 46.  Pneumococcal polysaccharide (PPSV23) vaccine. One dose is recommended after age 98. Talk to your health care provider about which screenings and vaccines you need and how often you need them. This information is not intended to replace advice given to you by your health care provider. Make sure you discuss any questions you have with your health care provider. Document Released: 02/06/2015 Document Revised: 09/30/2015 Document Reviewed: 11/11/2014 Elsevier Interactive Patient Education  2017 [place] Prevention in the Home Falls can cause injuries. They can happen to people of all ages. There are many things you can do to make your home safe and to help prevent falls. What can I do on the outside of my home?  Regularly fix the edges of walkways and driveways and fix any cracks.  Remove anything that might make you trip as you walk through a door, such as a raised step or threshold.  Trim any bushes or trees on the path to your home.  Use bright outdoor lighting.  Clear any walking paths of anything that might make someone trip, such as rocks or tools.  Regularly check to see if handrails are loose or broken. Make sure that both sides of any steps have handrails.  Any raised decks and porches should have guardrails  on the edges.  Have any leaves, snow, or ice cleared regularly.  Use sand or salt on walking paths during winter.  Clean up any spills in your garage right away. This includes oil or grease spills. What can I do in the bathroom?  Use night lights.  Install grab bars by the toilet and in the tub and shower. Do not use towel bars as grab bars.  Use non-skid mats or decals in the tub or shower.  If you need to sit down in the shower, use a plastic, non-slip stool.  Keep the floor dry. Clean up any water that spills on the floor as soon as it happens.  Remove soap buildup in the tub or shower regularly.  Attach bath mats securely with double-sided non-slip rug tape.  Do not have throw rugs and other things on the floor that can make you trip. What can I do in the bedroom?  Use night lights.  Make sure that you have a light by your bed that is easy to reach.  Do not use any sheets or blankets that are too big for your bed. They should not hang down onto  the floor.  Have a firm chair that has side arms. You can use this for support while you get dressed.  Do not have throw rugs and other things on the floor that can make you trip. What can I do in the kitchen?  Clean up any spills right away.  Avoid walking on wet floors.  Keep items that you use a lot in easy-to-reach places.  If you need to reach something above you, use a strong step stool that has a grab bar.  Keep electrical cords out of the way.  Do not use floor polish or wax that makes floors slippery. If you must use wax, use non-skid floor wax.  Do not have throw rugs and other things on the floor that can make you trip. What can I do with my stairs?  Do not leave any items on the stairs.  Make sure that there are handrails on both sides of the stairs and use them. Fix handrails that are broken or loose. Make sure that handrails are as long as the stairways.  Check any carpeting to make sure that it is  firmly attached to the stairs. Fix any carpet that is loose or worn.  Avoid having throw rugs at the top or bottom of the stairs. If you do have throw rugs, attach them to the floor with carpet tape.  Make sure that you have a light switch at the top of the stairs and the bottom of the stairs. If you do not have them, ask someone to add them for you. What else can I do to help prevent falls?  Wear shoes that:  Do not have high heels.  Have rubber bottoms.  Are comfortable and fit you well.  Are closed at the toe. Do not wear sandals.  If you use a stepladder:  Make sure that it is fully opened. Do not climb a closed stepladder.  Make sure that both sides of the stepladder are locked into place.  Ask someone to hold it for you, if possible.  Clearly mark and make sure that you can see:  Any grab bars or handrails.  First and last steps.  Where the edge of each step is.  Use tools that help you move around (mobility aids) if they are needed. These include:  Canes.  Walkers.  Scooters.  Crutches.  Turn on the lights when you go into a dark area. Replace any light bulbs as soon as they burn out.  Set up your furniture so you have a clear path. Avoid moving your furniture around.  If any of your floors are uneven, fix them.  If there are any pets around you, be aware of where they are.  Review your medicines with your doctor. Some medicines can make you feel dizzy. This can increase your chance of falling. Ask your doctor what other things that you can do to help prevent falls. This information is not intended to replace advice given to you by your health care provider. Make sure you discuss any questions you have with your health care provider. Document Released: 11/06/2008 Document Revised: 06/18/2015 Document Reviewed: 02/14/2014 Elsevier Interactive Patient Education  2017 [employer].

## 2020-06-11 ENCOUNTER — Ambulatory Visit: Payer: Medicare Other | Admitting: Internal Medicine

## 2020-06-19 ENCOUNTER — Ambulatory Visit (INDEPENDENT_AMBULATORY_CARE_PROVIDER_SITE_OTHER): Payer: Medicare Other | Admitting: Internal Medicine

## 2020-06-19 ENCOUNTER — Other Ambulatory Visit: Payer: Self-pay

## 2020-06-19 ENCOUNTER — Encounter: Payer: Self-pay | Admitting: Internal Medicine

## 2020-06-19 VITALS — BP 134/76 | HR 80 | Temp 98.1°F | Resp 16 | Ht 65.0 in | Wt 100.0 lb

## 2020-06-19 DIAGNOSIS — D509 Iron deficiency anemia, unspecified: Secondary | ICD-10-CM | POA: Diagnosis not present

## 2020-06-19 DIAGNOSIS — R413 Other amnesia: Secondary | ICD-10-CM

## 2020-06-19 DIAGNOSIS — E7800 Pure hypercholesterolemia, unspecified: Secondary | ICD-10-CM

## 2020-06-19 DIAGNOSIS — R634 Abnormal weight loss: Secondary | ICD-10-CM | POA: Diagnosis not present

## 2020-06-19 DIAGNOSIS — I10 Essential (primary) hypertension: Secondary | ICD-10-CM

## 2020-06-19 DIAGNOSIS — Z8673 Personal history of transient ischemic attack (TIA), and cerebral infarction without residual deficits: Secondary | ICD-10-CM | POA: Diagnosis not present

## 2020-06-19 DIAGNOSIS — G319 Degenerative disease of nervous system, unspecified: Secondary | ICD-10-CM

## 2020-06-19 LAB — HEPATIC FUNCTION PANEL
ALT: 8 U/L (ref 0–35)
AST: 19 U/L (ref 0–37)
Albumin: 4.1 g/dL (ref 3.5–5.2)
Alkaline Phosphatase: 59 U/L (ref 39–117)
Bilirubin, Direct: 0.1 mg/dL (ref 0.0–0.3)
Total Bilirubin: 0.4 mg/dL (ref 0.2–1.2)
Total Protein: 6.1 g/dL (ref 6.0–8.3)

## 2020-06-19 LAB — LIPID PANEL
Cholesterol: 209 mg/dL — ABNORMAL HIGH (ref 0–200)
HDL: 57.7 mg/dL (ref >39.00)
LDL Cholesterol: 111 mg/dL — ABNORMAL HIGH (ref 0–99)
NonHDL: 151.19
Total CHOL/HDL Ratio: 4
Triglycerides: 199 mg/dL — ABNORMAL HIGH (ref 0.0–149.0)
VLDL: 39.8 mg/dL (ref 0.0–40.0)

## 2020-06-19 LAB — VITAMIN B12: Vitamin B-12: 279 pg/mL (ref 211–911)

## 2020-06-19 LAB — BASIC METABOLIC PANEL
BUN: 26 mg/dL — ABNORMAL HIGH (ref 6–23)
CO2: 30 mEq/L (ref 19–32)
Calcium: 9.1 mg/dL (ref 8.4–10.5)
Chloride: 104 mEq/L (ref 96–112)
Creatinine, Ser: 1.16 mg/dL (ref 0.40–1.20)
GFR: 41.28 mL/min — ABNORMAL LOW (ref >60.00)
Glucose, Bld: 89 mg/dL (ref 70–99)
Potassium: 4.5 mEq/L (ref 3.5–5.1)
Sodium: 140 mEq/L (ref 135–145)

## 2020-06-19 NOTE — Progress Notes (Signed)
Patient ID: Rachel Martinez, female   DOB: 25-May-1927, 87 y.o.   MRN: [id number]   Subjective:    Patient ID: Rachel Martinez, female    DOB: February 20, 1927, 87 y.o.   MRN: [id number]  HPI This visit occurred during the SARS-CoV-2 public health emergency.  Safety protocols were in place, including screening questions prior to the visit, additional usage of staff PPE, and extensive cleaning of exam room while observing appropriate contact time as indicated for disinfecting solutions.  Patient here for a scheduled follow up.  She is accompanied by her son.  History obtained from both of them.  Here to follow up regarding her memory issues.  She reports she is doing relatively well.  Able to perform her ADLs.  No chest pain or sob reported.  No abdominal pain or bowel change reported.  Eating. Weight is up a couple of pounds.  Son reports increased confusion.  States has problems remembering her grandchildren's names, etc.  Also reports she repeats herself.  Interested in medication. Did not keep appt for neurology referral.  No chest pain or sob reported.  No abdominal pain or bowel change reported.  No cough or congestion.     Past Medical History:  Diagnosis Date  . Anemia   . Chicken pox   . CVA (cerebral vascular accident) ([place]) 8/06  . Depression   . GERD (gastroesophageal reflux disease)   . Heart palpitations   . Hypercholesteremia   . Hypertension   . Stroke [facility])    Past Surgical History:  Procedure Laterality Date  . APPENDECTOMY     Family History  Problem Relation Age of Onset  . CVA Mother   . Diabetes Father   . Heart disease Father   . Cancer Sister        breast   . Diabetes Sister   . Cancer Brother        stomach  . Leukemia Brother   . Lung disease Brother   . Hypertension Sister   . Cancer Sister        stomach  . Diabetes Sister   . Mental illness Brother        suicide  . Breast cancer Paternal Aunt   . Diabetes Son    Social History   Socioeconomic  History  . Marital status: Married    Spouse name: Not on file  . Number of children: Not on file  . Years of education: Not on file  . Highest education level: Not on file  Occupational History  . Not on file  Tobacco Use  . Smoking status: Former Smoker    Packs/day: 0.25    Years: 30.00    Pack years: 7.50    Quit date: 09/13/2003    Years since quitting: 16.7  . Smokeless tobacco: Never Used  Vaping Use  . Vaping Use: Never used  Substance and Sexual Activity  . Alcohol use: No    Alcohol/week: 0.0 standard drinks  . Drug use: No  . Sexual activity: Never  Other Topics Concern  . Not on file  Social History Narrative  . Not on file   Social Determinants of Health   Financial Resource Strain: Low Risk   . Difficulty of Paying Living Expenses: Not hard at all  Food Insecurity: No Food Insecurity  . Worried About Charity fundraiser in the Last Year: Never true  . Ran Out of Food in the Last Year: Never true  Transportation Needs:  No Transportation Needs  . Lack of Transportation (Medical): No  . Lack of Transportation (Non-Medical): No  Physical Activity: Not on file  Stress: No Stress Concern Present  . Feeling of Stress : Not at all  Social Connections: Unknown  . Frequency of Communication with Friends and Family: More than three times a week  . Frequency of Social Gatherings with Friends and Family: Not on file  . Attends Religious Services: Not on file  . Active Member of Clubs or Organizations: Not on file  . Attends Archivist Meetings: Not on file  . Marital Status: Not on file    Outpatient Encounter Medications as of 06/19/2020  Medication Sig  . calcium carbonate (TUMS - DOSED IN MG ELEMENTAL CALCIUM) 500 MG chewable tablet Chew 1 tablet by mouth 3 (three) times daily as needed.   . clopidogrel (PLAVIX) 75 MG tablet Take 1 tablet (75 mg total) by mouth daily.  Marland Kitchen diltiazem (CARDIZEM CD) 120 MG 24 hr capsule TAKE ONE CAPSULE BY MOUTH ONCE DAILY   . pravastatin (PRAVACHOL) 40 MG tablet Take 1 tablet (40 mg total) by mouth daily.  . sertraline (ZOLOFT) 50 MG tablet Take 1 tablet (50 mg total) by mouth daily.   No facility-administered encounter medications on file as of 06/19/2020.    Review of Systems  Constitutional: Negative for appetite change and unexpected weight change.  HENT: Negative for congestion.   Respiratory: Negative for cough, chest tightness and shortness of breath.   Cardiovascular: Negative for chest pain, palpitations and leg swelling.  Gastrointestinal: Negative for abdominal pain, diarrhea, nausea and vomiting.  Genitourinary: Negative for difficulty urinating and dysuria.  Musculoskeletal: Negative for joint swelling and myalgias.  Skin: Negative for color change and rash.  Neurological: Negative for dizziness, light-headedness and headaches.       Memory change as outlined.    Psychiatric/Behavioral: Negative for agitation and dysphoric mood.       Objective:    Physical Exam Vitals reviewed.  Constitutional:      General: She is not in acute distress.    Appearance: Normal appearance.  HENT:     Head: Normocephalic and atraumatic.     Right Ear: External ear normal.     Left Ear: External ear normal.  Eyes:     General: No scleral icterus.       Right eye: No discharge.        Left eye: No discharge.     Conjunctiva/sclera: Conjunctivae normal.  Neck:     Thyroid: No thyromegaly.  Cardiovascular:     Rate and Rhythm: Normal rate and regular rhythm.  Pulmonary:     Effort: No respiratory distress.     Breath sounds: Normal breath sounds. No wheezing.  Abdominal:     General: Bowel sounds are normal.     Palpations: Abdomen is soft.     Tenderness: There is no abdominal tenderness.  Musculoskeletal:        General: No swelling or tenderness.     Cervical back: Neck supple. No tenderness.  Lymphadenopathy:     Cervical: No cervical adenopathy.  Skin:    Findings: No erythema or rash.   Neurological:     Mental Status: She is alert.     Comments: Mini mental status exam:  11/30.   Psychiatric:        Mood and Affect: Mood normal.        Behavior: Behavior normal.     BP 134/76  Pulse 80   Temp 98.1 F (36.7 C)   Resp 16   Ht 5\' 5"  (1.651 m)   Wt 100 lb (45.4 kg)   SpO2 98%   BMI 16.64 kg/m  Wt Readings from Last 3 Encounters:  06/19/20 100 lb (45.4 kg)  06/01/20 98 lb (44.5 kg)  01/06/20 98 lb 9.6 oz (44.7 kg)     Lab Results  Component Value Date   WBC 6.1 01/06/2020   HGB 12.4 01/06/2020   HCT 36.9 01/06/2020   PLT 205.0 01/06/2020   GLUCOSE 89 06/19/2020   CHOL 209 (H) 06/19/2020   TRIG 199.0 (H) 06/19/2020   HDL 57.70 06/19/2020   LDLCALC 111 (H) 06/19/2020   ALT 8 06/19/2020   AST 19 06/19/2020   NA 140 06/19/2020   K 4.5 06/19/2020   CL 104 06/19/2020   CREATININE 1.16 06/19/2020   BUN 26 (H) 06/19/2020   CO2 30 06/19/2020   TSH 1.68 01/06/2020   HGBA1C 5.4 10/29/2018    CT Head Wo Contrast  Result Date: 07/18/2018 CLINICAL DATA:  Fall last night.  Head injury. EXAM: CT HEAD WITHOUT CONTRAST CT CERVICAL SPINE WITHOUT CONTRAST TECHNIQUE: Multidetector CT imaging of the head and cervical spine was performed following the standard protocol without intravenous contrast. Multiplanar CT image reconstructions of the cervical spine were also generated. COMPARISON:  CT head 10/22/2016.  MRI brain 10/22/2016. FINDINGS: CT HEAD FINDINGS Brain: There is no evidence of acute intracranial hemorrhage, mass lesion, brain edema or extra-axial fluid collection. There is stable mild atrophy with prominence of the ventricles and subarachnoid spaces. Patchy and confluent low-density is present in the periventricular and subcortical white matter, similar to previous study. There is no CT evidence of acute cortical infarction. Vascular: Prominent intracranial vascular calcifications. No hyperdense vessel identified. Skull: Negative for fracture or focal lesion.  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 The paranasal sinuses are otherwise clear without air-fluid levels. The mastoid air cells and middle ears are clear. Previous bilateral lens surgery without acute orbital findings. Other: Right parietal scalp soft tissue injury. CT CERVICAL SPINE FINDINGS Alignment: Normal. Skull base and vertebrae: No evidence of acute fracture or traumatic subluxation. The posterior arch of C1 is incomplete. Soft tissues and spinal canal: No prevertebral fluid or swelling. No visible canal hematoma. Disc levels: Mild multilevel spondylosis for age with mild uncinate spurring and facet hypertrophy. No large disc herniation or high-grade spinal stenosis identified. Upper chest: Mild biapical scarring. Other: None. IMPRESSION: 1. Right parietal scalp soft tissue injury without evidence of calvarial fracture. 2. No acute intracranial findings. Stable atrophy and chronic small vessel ischemic changes. 3. No evidence of acute cervical spine fracture, traumatic subluxation or static signs of instability. Mild cervical spondylosis. Electronically Signed   By: Richardean Sale M.D.   On: 07/18/2018 20:38   CT Cervical Spine Wo Contrast  Result Date: 07/18/2018 CLINICAL DATA:  Fall last night.  Head injury. EXAM: CT HEAD WITHOUT CONTRAST CT CERVICAL SPINE WITHOUT CONTRAST TECHNIQUE: Multidetector CT imaging of the head and cervical spine was performed following the standard protocol without intravenous contrast. Multiplanar CT image reconstructions of the cervical spine were also generated. COMPARISON:  CT head 10/22/2016.  MRI brain 10/22/2016. FINDINGS: CT HEAD FINDINGS Brain: There is no evidence of acute intracranial hemorrhage, mass lesion, brain edema or extra-axial fluid collection. There is stable mild atrophy with prominence of the ventricles and subarachnoid spaces. Patchy and confluent low-density  is present in the  periventricular and subcortical white matter, similar to previous study. There is no CT evidence of acute cortical infarction. Vascular: Prominent intracranial vascular calcifications. No hyperdense vessel identified. Skull: Negative for fracture or focal lesion. 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 The paranasal sinuses are otherwise clear without air-fluid levels. The mastoid air cells and middle ears are clear. Previous bilateral lens surgery without acute orbital findings. Other: Right parietal scalp soft tissue injury. CT CERVICAL SPINE FINDINGS Alignment: Normal. Skull base and vertebrae: No evidence of acute fracture or traumatic subluxation. The posterior arch of C1 is incomplete. Soft tissues and spinal canal: No prevertebral fluid or swelling. No visible canal hematoma. Disc levels: Mild multilevel spondylosis for age with mild uncinate spurring and facet hypertrophy. No large disc herniation or high-grade spinal stenosis identified. Upper chest: Mild biapical scarring. Other: None. IMPRESSION: 1. Right parietal scalp soft tissue injury without evidence of calvarial fracture. 2. No acute intracranial findings. Stable atrophy and chronic small vessel ischemic changes. 3. No evidence of acute cervical spine fracture, traumatic subluxation or static signs of instability. Mild cervical spondylosis. Electronically Signed   By: Richardean Sale M.D.   On: 07/18/2018 20:38       Assessment & Plan:   Problem List Items Addressed This Visit    Cerebral atrophy [facility])    Noted on recent CT scan.  Did not keep appt with neurology.  Discussed memory issues today.  W/up and treat as outlined.        History of CVA (cerebrovascular accident)    Continue plavix.       Hypercholesterolemia - Primary    Continue pravastatin.  Follow lipid panel and liver function tests.        Relevant Orders   Hepatic function panel (Completed)   Lipid  panel (Completed)   Hypertension    Blood pressure as outlined. Continue diltiazem.  No changes made.  Follow pressures.  Follow metabolic panel.       Relevant Orders   Basic metabolic panel (Completed)   Iron deficiency anemia    Has seen hematology previously - required iron infusions.  Follow cbc.       Memory change    Discussed with her and her son today.  11/30 on mini mental status exam.  CT head as outlined.  Did not keep neurology appt and desires not to go.  Discussed w/up including labs.  Discussed medication.  Trial of aricept.  Discussed possible side effects.  Follow.        Relevant Orders   Vitamin B12 (Completed)   Weight loss    Weight up 2 pounds from last check.  Continue to encourage increased po intake.  Drinking nutritional shakes.  Follow.           Einar Pheasant, MD

## 2020-06-20 ENCOUNTER — Encounter: Payer: Self-pay | Admitting: Internal Medicine

## 2020-06-21 DIAGNOSIS — G319 Degenerative disease of nervous system, unspecified: Secondary | ICD-10-CM | POA: Insufficient documentation

## 2020-06-21 NOTE — Assessment & Plan Note (Signed)
Has seen hematology previously - required iron infusions.  Follow cbc.

## 2020-06-21 NOTE — Assessment & Plan Note (Signed)
Weight up 2 pounds from last check.  Continue to encourage increased po intake.  Drinking nutritional shakes.  Follow.

## 2020-06-21 NOTE — Assessment & Plan Note (Signed)
Continue plavix °

## 2020-06-21 NOTE — Assessment & Plan Note (Signed)
Noted on recent CT scan.  Did not keep appt with neurology.  Discussed memory issues today.  W/up and treat as outlined.

## 2020-06-21 NOTE — Assessment & Plan Note (Signed)
Blood pressure as outlined. Continue diltiazem.  No changes made.  Follow pressures.  Follow metabolic panel.

## 2020-06-21 NOTE — Assessment & Plan Note (Signed)
Discussed with her and her son today.  11/30 on mini mental status exam.  CT head as outlined.  Did not keep neurology appt and desires not to go.  Discussed w/up including labs.  Discussed medication.  Trial of aricept.  Discussed possible side effects.  Follow.

## 2020-06-21 NOTE — Assessment & Plan Note (Signed)
Continue pravastatin. Follow lipid panel and liver function tests.  

## 2020-06-23 ENCOUNTER — Telehealth: Payer: Self-pay | Admitting: *Deleted

## 2020-06-23 NOTE — Telephone Encounter (Signed)
-----   Message from Einar Pheasant, MD sent at 06/21/2020  3:16 PM EDT ----- Notify - B12 level is in normal range, but in low normal.  Have her start oral B12 1026mcg q day.  Cholesterol has improved some from previous check.  Will follow.  Kidney function stable from previous check.  Stay hydrated.  Liver function tests are wnl.  Notify son, I am going to start her on aricept 5mg  q day.  Take in evening.  Follow for any problems with decreased appetite, etc.  Also monitor to see if symptoms stabilize.  If agreeable, can send in aricept 5mg  q day.

## 2020-06-25 ENCOUNTER — Other Ambulatory Visit: Payer: Self-pay

## 2020-06-25 ENCOUNTER — Telehealth: Payer: Self-pay

## 2020-06-25 MED ORDER — DONEPEZIL HCL 5 MG PO TABS: 5.0000 mg | ORAL_TABLET | Freq: Every day | ORAL | 1 refills | Status: DC

## 2020-06-25 NOTE — Telephone Encounter (Signed)
Left message to call back for lab results.

## 2020-09-04 ENCOUNTER — Telehealth: Payer: Self-pay | Admitting: Internal Medicine

## 2020-09-04 NOTE — Telephone Encounter (Signed)
Noted. Can you follow-up with the patient to make sure she went to be evaluated?

## 2020-09-04 NOTE — Telephone Encounter (Signed)
Noted. Please follow-up with them on Monday to see how she is doing.

## 2020-09-04 NOTE — Telephone Encounter (Signed)
Spoken to patient and a man name michael. Patient said she is not going to UC/ED. She was very adamant. She then passed the phone off and michael stated she doesn't want to go and that her sx are not that bad. Urged that if sx persist they really need to be evaluated. Legrand Como stated he will try and disconnected call.

## 2020-09-04 NOTE — Telephone Encounter (Signed)
Reason for Triage Call /in person: Abdominal pain  Symptoms:Moderate  Pain Scale:none, present - not adequately treated, location, Abdomen, and level of pain (1-10, 10 severe), 0  Onset: A couple weeks ago  Duration: Intermitted  Medications:medication not used  Last seen for this problem:  Outcome: Access nurse advised UC/ED. If home care is not working and unable to see PCP within 24 hrs.  Attach Access Nurse Triage Note.

## 2020-09-04 NOTE — Telephone Encounter (Signed)
Patient informed, Due to the high volume of calls and your symptoms we have to forward your call to our Triage Nurse to expedient your call. Please hold for the transfer.  Patient transferred to Access Nurse. Due to having stomach cramps and was taking a nerve medicine and is unsure what to do.No openings in office or virtual.

## 2020-09-06 NOTE — Telephone Encounter (Signed)
Agree with calling to follow up on how she is doing.  Please let them know that I was not in the office last week and not in office beginning of this week. Please confirm doing ok

## 2020-09-07 NOTE — Telephone Encounter (Signed)
LMTCB

## 2020-09-08 NOTE — Telephone Encounter (Signed)
Spoke with patients son. Symptoms have seemed to resolve. No acute issues at this time. Patients son will let us know if problems.

## 2020-10-01 ENCOUNTER — Ambulatory Visit: Payer: Medicare Other | Admitting: Internal Medicine

## 2020-11-05 ENCOUNTER — Telehealth: Payer: Self-pay | Admitting: Internal Medicine

## 2020-11-05 NOTE — Telephone Encounter (Signed)
Rachel Martinez's son called. His mother has an appointment with Dr Nicki Reaper and he will be with Rachel Martinez. He wanted Dr Nicki Reaper know that Rachel Martinez is real shacking and would like her heart checked out. It seems to pump fast at times.

## 2020-11-05 NOTE — Telephone Encounter (Signed)
I called patient's son & spoke with him about patient's symptoms. He stated that he was not sure where the statement about heart pumping fast came from? He said that she complains that she is shaky & weak when standing. He stated that she has not however complained of dizziness. She has not complained of SOB, CP or heart palpations. He said that she did eat pretty well, but seems to keep losing weight. He is not sure really how much she drinks however, but she does drink. He said that she complains that her stomach growls, but has had not diarrhea or pain associated. Since nothing acute I advised we would see both of them tomorrow at 9 am.

## 2020-11-06 ENCOUNTER — Other Ambulatory Visit: Payer: Self-pay

## 2020-11-06 ENCOUNTER — Ambulatory Visit (INDEPENDENT_AMBULATORY_CARE_PROVIDER_SITE_OTHER): Payer: Medicare Other | Admitting: Internal Medicine

## 2020-11-06 VITALS — BP 120/68 | HR 64 | Temp 98.0°F | Resp 16 | Ht 65.0 in | Wt 92.6 lb

## 2020-11-06 DIAGNOSIS — D509 Iron deficiency anemia, unspecified: Secondary | ICD-10-CM

## 2020-11-06 DIAGNOSIS — I10 Essential (primary) hypertension: Secondary | ICD-10-CM

## 2020-11-06 DIAGNOSIS — Z23 Encounter for immunization: Secondary | ICD-10-CM | POA: Diagnosis not present

## 2020-11-06 DIAGNOSIS — Z8673 Personal history of transient ischemic attack (TIA), and cerebral infarction without residual deficits: Secondary | ICD-10-CM

## 2020-11-06 DIAGNOSIS — R634 Abnormal weight loss: Secondary | ICD-10-CM

## 2020-11-06 DIAGNOSIS — I4949 Other premature depolarization: Secondary | ICD-10-CM

## 2020-11-06 DIAGNOSIS — G319 Degenerative disease of nervous system, unspecified: Secondary | ICD-10-CM | POA: Diagnosis not present

## 2020-11-06 DIAGNOSIS — E7800 Pure hypercholesterolemia, unspecified: Secondary | ICD-10-CM

## 2020-11-06 LAB — HEPATIC FUNCTION PANEL
ALT: 7 U/L (ref 0–35)
AST: 21 U/L (ref 0–37)
Albumin: 4.2 g/dL (ref 3.5–5.2)
Alkaline Phosphatase: 62 U/L (ref 39–117)
Bilirubin, Direct: 0.1 mg/dL (ref 0.0–0.3)
Total Bilirubin: 0.6 mg/dL (ref 0.2–1.2)
Total Protein: 6.4 g/dL (ref 6.0–8.3)

## 2020-11-06 LAB — BASIC METABOLIC PANEL
BUN: 25 mg/dL — ABNORMAL HIGH (ref 6–23)
CO2: 31 mEq/L (ref 19–32)
Calcium: 9.4 mg/dL (ref 8.4–10.5)
Chloride: 101 mEq/L (ref 96–112)
Creatinine, Ser: 1.03 mg/dL (ref 0.40–1.20)
GFR: 47.48 mL/min — ABNORMAL LOW (ref >60.00)
Glucose, Bld: 89 mg/dL (ref 70–99)
Potassium: 4.4 mEq/L (ref 3.5–5.1)
Sodium: 138 mEq/L (ref 135–145)

## 2020-11-06 LAB — IBC + FERRITIN
Ferritin: 25.8 ng/mL (ref 10.0–291.0)
Iron: 84 ug/dL (ref 42–145)
Saturation Ratios: 22.6 % (ref 20.0–50.0)
TIBC: 372.4 ug/dL (ref 250.0–450.0)
Transferrin: 266 mg/dL (ref 212.0–360.0)

## 2020-11-06 LAB — CBC WITH DIFFERENTIAL/PLATELET
Basophils Absolute: 0 10*3/uL (ref 0.0–0.1)
Basophils Relative: 1.1 % (ref 0.0–3.0)
Eosinophils Absolute: 0 10*3/uL (ref 0.0–0.7)
Eosinophils Relative: 0.7 % (ref 0.0–5.0)
HCT: 38.8 % (ref 36.0–46.0)
Hemoglobin: 12.9 g/dL (ref 12.0–15.0)
Lymphocytes Relative: 22.5 % (ref 12.0–46.0)
Lymphs Abs: 0.9 10*3/uL (ref 0.7–4.0)
MCHC: 33.3 g/dL (ref 30.0–36.0)
MCV: 91.2 fl (ref 78.0–100.0)
Monocytes Absolute: 0.3 10*3/uL (ref 0.1–1.0)
Monocytes Relative: 6.9 % (ref 3.0–12.0)
Neutro Abs: 2.8 10*3/uL (ref 1.4–7.7)
Neutrophils Relative %: 68.8 % (ref 43.0–77.0)
Platelets: 184 10*3/uL (ref 150.0–400.0)
RBC: 4.25 Mil/uL (ref 3.87–5.11)
RDW: 14.7 % (ref 11.5–15.5)
WBC: 4 10*3/uL (ref 4.0–10.5)

## 2020-11-06 LAB — TSH: TSH: 1.85 u[IU]/mL (ref 0.35–5.50)

## 2020-11-06 LAB — LIPID PANEL
Cholesterol: 227 mg/dL — ABNORMAL HIGH (ref 0–200)
HDL: 74.3 mg/dL (ref >39.00)
LDL Cholesterol: 130 mg/dL — ABNORMAL HIGH (ref 0–99)
NonHDL: 153.03
Total CHOL/HDL Ratio: 3
Triglycerides: 114 mg/dL (ref 0.0–149.0)
VLDL: 22.8 mg/dL (ref 0.0–40.0)

## 2020-11-06 NOTE — Assessment & Plan Note (Signed)
Off plavix.  Discussed starting ECASA 81mg  q day.  Follow.

## 2020-11-06 NOTE — Progress Notes (Signed)
Patient ID: Rachel Martinez, female   DOB: 20-Aug-1927, 87 y.o.   MRN: [id number]   Subjective:    Patient ID: Rachel Martinez, female    DOB: July 15, 1927, 87 y.o.   MRN: [id number]  This visit occurred during the SARS-CoV-2 public health emergency.  Safety protocols were in place, including screening questions prior to the visit, additional usage of staff PPE, and extensive cleaning of exam room while observing appropriate contact time as indicated for disinfecting solutions.   Patient here for work in appt.   Chief Complaint  Patient presents with   Weight Loss   .   HPI Here for work in with increased weight loss.  Also her daughter-n-law noticed occasional skip beat when listening to her heart.  She is accompanied by her son.  History obtained from both of them.  She denies any chest pain.  She denies any increased heart rate or palpitations. Breathing stable.  No nausea or vomiting.  No abdominal pain.  No bowel change reported.  Only on aricept.  Staying with her son now.  Did have a fall.  [place] flower garden.  No residual problems from the fall.     Past Medical History:  Diagnosis Date   Anemia    Chicken pox    CVA (cerebral vascular accident) ([place]) 8/06   Depression    GERD (gastroesophageal reflux disease)    Heart palpitations    Hypercholesteremia    Hypertension    Stroke [facility])    Past Surgical History:  Procedure Laterality Date   APPENDECTOMY     Family History  Problem Relation Age of Onset   CVA Mother    Diabetes Father    Heart disease Father    Cancer Sister        breast    Diabetes Sister    Cancer Brother        stomach   Leukemia Brother    Lung disease Brother    Hypertension Sister    Cancer Sister        stomach   Diabetes Sister    Mental illness Brother        suicide   Breast cancer Paternal Aunt    Diabetes Son    Social History   Socioeconomic History   Marital status: Married    Spouse name: Not on file   Number of  children: Not on file   Years of education: Not on file   Highest education level: Not on file  Occupational History   Not on file  Tobacco Use   Smoking status: Former    Packs/day: 0.25    Years: 30.00    Pack years: 7.50    Types: Cigarettes    Quit date: 09/13/2003    Years since quitting: 17.1   Smokeless tobacco: Never  Vaping Use   Vaping Use: Never used  Substance and Sexual Activity   Alcohol use: No    Alcohol/week: 0.0 standard drinks   Drug use: No   Sexual activity: Never  Other Topics Concern   Not on file  Social History Narrative   Not on file   Social Determinants of Health   Financial Resource Strain: Low Risk    Difficulty of Paying Living Expenses: Not hard at all  Food Insecurity: No Food Insecurity   Worried About Charity fundraiser in the Last Year: Never true   [place] in the Last Year: Never true  Transportation Needs: No Data processing manager (Medical): No   Lack of Transportation (Non-Medical): No  Physical Activity: Not on file  Stress: No Stress Concern Present   Feeling of Stress : Not at all  Social Connections: Unknown   Frequency of Communication with Friends and Family: More than three times a week   Frequency of Social Gatherings with Friends and Family: Not on file   Attends Religious Services: Not on file   Active Member of Clubs or Organizations: Not on file   Attends Archivist Meetings: Not on file   Marital Status: Not on file     Review of Systems  Constitutional:        Eating.  Weight loss.    HENT:  Negative for congestion and sinus pressure.   Respiratory:  Negative for cough, chest tightness and shortness of breath.   Cardiovascular:  Negative for chest pain, palpitations and leg swelling.  Gastrointestinal:  Negative for abdominal pain, diarrhea, nausea and vomiting.  Genitourinary:  Negative for difficulty urinating and dysuria.  Musculoskeletal:  Negative for joint  swelling and myalgias.  Skin:  Negative for color change and rash.  Neurological:  Negative for dizziness, light-headedness and headaches.  Psychiatric/Behavioral:  Negative for agitation and dysphoric mood.       Objective:     BP 120/68   Pulse 64   Temp 98 F (36.7 C)   Resp 16   Ht 5\' 5"  (1.651 m)   Wt 92 lb 9.6 oz (42 kg)   SpO2 98%   BMI 15.41 kg/m  Wt Readings from Last 3 Encounters:  11/06/20 92 lb 9.6 oz (42 kg)  06/19/20 100 lb (45.4 kg)  06/01/20 98 lb (44.5 kg)    Physical Exam Vitals reviewed.  Constitutional:      General: She is not in acute distress.    Appearance: Normal appearance.  HENT:     Head: Normocephalic and atraumatic.     Right Ear: External ear normal.     Left Ear: External ear normal.  Eyes:     General: No scleral icterus.       Right eye: No discharge.        Left eye: No discharge.     Conjunctiva/sclera: Conjunctivae normal.  Neck:     Thyroid: No thyromegaly.  Cardiovascular:     Rate and Rhythm: Normal rate and regular rhythm.  Pulmonary:     Effort: No respiratory distress.     Breath sounds: Normal breath sounds. No wheezing.  Abdominal:     General: Bowel sounds are normal.     Palpations: Abdomen is soft.     Tenderness: There is no abdominal tenderness.  Musculoskeletal:        General: No swelling or tenderness.     Cervical back: Neck supple. No tenderness.  Lymphadenopathy:     Cervical: No cervical adenopathy.  Skin:    Findings: No erythema or rash.  Neurological:     Mental Status: She is alert.  Psychiatric:        Mood and Affect: Mood normal.        Behavior: Behavior normal.     Outpatient Encounter Medications as of 11/06/2020  Medication Sig   calcium carbonate (TUMS - DOSED IN MG ELEMENTAL CALCIUM) 500 MG chewable tablet Chew 1 tablet by mouth 3 (three) times daily as needed.    donepezil (ARICEPT) 5 MG tablet Take 1 tablet (5 mg total) by  mouth at bedtime.   [DISCONTINUED] clopidogrel (PLAVIX)  75 MG tablet Take 1 tablet (75 mg total) by mouth daily.   [DISCONTINUED] diltiazem (CARDIZEM CD) 120 MG 24 hr capsule TAKE ONE CAPSULE BY MOUTH ONCE DAILY   [DISCONTINUED] pravastatin (PRAVACHOL) 40 MG tablet Take 1 tablet (40 mg total) by mouth daily.   [DISCONTINUED] sertraline (ZOLOFT) 50 MG tablet Take 1 tablet (50 mg total) by mouth daily.   No facility-administered encounter medications on file as of 11/06/2020.     Lab Results  Component Value Date   WBC 4.0 11/06/2020   HGB 12.9 11/06/2020   HCT 38.8 11/06/2020   PLT 184.0 11/06/2020   GLUCOSE 89 11/06/2020   CHOL 227 (H) 11/06/2020   TRIG 114.0 11/06/2020   HDL 74.30 11/06/2020   LDLCALC 130 (H) 11/06/2020   ALT 7 11/06/2020   AST 21 11/06/2020   NA 138 11/06/2020   K 4.4 11/06/2020   CL 101 11/06/2020   CREATININE 1.03 11/06/2020   BUN 25 (H) 11/06/2020   CO2 31 11/06/2020   TSH 1.85 11/06/2020   HGBA1C 5.4 10/29/2018    CT Head Wo Contrast  Result Date: 07/18/2018 CLINICAL DATA:  Fall last night.  Head injury. EXAM: CT HEAD WITHOUT CONTRAST CT CERVICAL SPINE WITHOUT CONTRAST TECHNIQUE: Multidetector CT imaging of the head and cervical spine was performed following the standard protocol without intravenous contrast. Multiplanar CT image reconstructions of the cervical spine were also generated. COMPARISON:  CT head 10/22/2016.  MRI brain 10/22/2016. FINDINGS: CT HEAD FINDINGS Brain: There is no evidence of acute intracranial hemorrhage, mass lesion, brain edema or extra-axial fluid collection. There is stable mild atrophy with prominence of the ventricles and subarachnoid spaces. Patchy and confluent low-density is present in the periventricular and subcortical white matter, similar to previous study. There is no CT evidence of acute cortical infarction. Vascular: Prominent intracranial vascular calcifications. No hyperdense vessel identified. Skull: Negative for fracture or focal lesion. 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 The paranasal sinuses are otherwise clear without air-fluid levels. The mastoid air cells and middle ears are clear. Previous bilateral lens surgery without acute orbital findings. Other: Right parietal scalp soft tissue injury. CT CERVICAL SPINE FINDINGS Alignment: Normal. Skull base and vertebrae: No evidence of acute fracture or traumatic subluxation. The posterior arch of C1 is incomplete. Soft tissues and spinal canal: No prevertebral fluid or swelling. No visible canal hematoma. Disc levels: Mild multilevel spondylosis for age with mild uncinate spurring and facet hypertrophy. No large disc herniation or high-grade spinal stenosis identified. Upper chest: Mild biapical scarring. Other: None. IMPRESSION: 1. Right parietal scalp soft tissue injury without evidence of calvarial fracture. 2. No acute intracranial findings. Stable atrophy and chronic small vessel ischemic changes. 3. No evidence of acute cervical spine fracture, traumatic subluxation or static signs of instability. Mild cervical spondylosis. Electronically Signed   By: Richardean Sale M.D.   On: 07/18/2018 20:38   CT Cervical Spine Wo Contrast  Result Date: 07/18/2018 CLINICAL DATA:  Fall last night.  Head injury. EXAM: CT HEAD WITHOUT CONTRAST CT CERVICAL SPINE WITHOUT CONTRAST TECHNIQUE: Multidetector CT imaging of the head and cervical spine was performed following the standard protocol without intravenous contrast. Multiplanar CT image reconstructions of the cervical spine were also generated. COMPARISON:  CT head 10/22/2016.  MRI brain 10/22/2016. FINDINGS: CT HEAD FINDINGS Brain: There is no evidence of acute intracranial hemorrhage, mass lesion, brain edema or  extra-axial fluid collection. There is stable mild atrophy with prominence of the ventricles and subarachnoid spaces. Patchy and confluent low-density is present in the periventricular and subcortical white  matter, similar to previous study. There is no CT evidence of acute cortical infarction. Vascular: Prominent intracranial vascular calcifications. No hyperdense vessel identified. Skull: Negative for fracture or focal lesion. 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 The paranasal sinuses are otherwise clear without air-fluid levels. The mastoid air cells and middle ears are clear. Previous bilateral lens surgery without acute orbital findings. Other: Right parietal scalp soft tissue injury. CT CERVICAL SPINE FINDINGS Alignment: Normal. Skull base and vertebrae: No evidence of acute fracture or traumatic subluxation. The posterior arch of C1 is incomplete. Soft tissues and spinal canal: No prevertebral fluid or swelling. No visible canal hematoma. Disc levels: Mild multilevel spondylosis for age with mild uncinate spurring and facet hypertrophy. No large disc herniation or high-grade spinal stenosis identified. Upper chest: Mild biapical scarring. Other: None. IMPRESSION: 1. Right parietal scalp soft tissue injury without evidence of calvarial fracture. 2. No acute intracranial findings. Stable atrophy and chronic small vessel ischemic changes. 3. No evidence of acute cervical spine fracture, traumatic subluxation or static signs of instability. Mild cervical spondylosis. Electronically Signed   By: Richardean Sale M.D.   On: 07/18/2018 20:38       Assessment & Plan:   Problem List Items Addressed This Visit     Cerebral atrophy [facility])    Noted on recent CT scan.  On aricept.  Follow.       History of CVA (cerebrovascular accident)    Off plavix.  Discussed starting ECASA 81mg  q day.  Follow.        Hypercholesterolemia    Continue pravastatin.  Follow lipid panel and liver function tests.        Relevant Orders   Hepatic function panel (Completed)   Lipid panel (Completed)   TSH (Completed)   Basic metabolic panel (Completed)    Hypertension    Blood pressure as outlined.on recheck improved.  Continue diltiazem.  No changes made.  Follow pressures.  Follow metabolic panel.       Iron deficiency anemia    Has seen hematology previously - required iron infusion.  Check cbc and iron studies.       Relevant Orders   CBC with Differential/Platelet (Completed)   IBC + Ferritin (Completed)   Premature beats    Daughter-n-law noticed.  She denies any increased heart rate or palpitations.  EKG - SR with no acute ischemic changes.  Follow.  Check routine labs.       Relevant Orders   EKG 12-Lead (Completed)   Weight loss - Primary    Weight down from last check.  Discussed increased po intake.  Discussed nutritional shakes.  Check routine labs.  Follow.       Other Visit Diagnoses     Need for immunization against influenza       Relevant Orders   Flu Vaccine QUAD High Dose(Fluad) (Completed)        Einar Pheasant, MD

## 2020-11-15 ENCOUNTER — Encounter: Payer: Self-pay | Admitting: Internal Medicine

## 2020-11-15 NOTE — Assessment & Plan Note (Addendum)
Blood pressure as outlined.on recheck improved.  Continue diltiazem.  No changes made.  Follow pressures.  Follow metabolic panel.

## 2020-11-15 NOTE — Assessment & Plan Note (Signed)
Noted on recent CT scan.  On aricept.  Follow.

## 2020-11-15 NOTE — Assessment & Plan Note (Signed)
Continue pravastatin. Follow lipid panel and liver function tests.  

## 2020-11-15 NOTE — Assessment & Plan Note (Signed)
Has seen hematology previously - required iron infusion.  Check cbc and iron studies.  

## 2020-11-15 NOTE — Assessment & Plan Note (Signed)
Daughter-n-law noticed.  She denies any increased heart rate or palpitations.  EKG - SR with no acute ischemic changes.  Follow.  Check routine labs.

## 2020-11-15 NOTE — Assessment & Plan Note (Signed)
Weight down from last check.  Discussed increased po intake.  Discussed nutritional shakes.  Check routine labs.  Follow.

## 2020-12-03 DIAGNOSIS — Z20822 Contact with and (suspected) exposure to covid-19: Secondary | ICD-10-CM | POA: Diagnosis not present

## 2021-01-26 ENCOUNTER — Telehealth: Payer: Self-pay | Admitting: Internal Medicine

## 2021-01-26 NOTE — Telephone Encounter (Signed)
Pt son called in stating that both of the pt feet is burning and sometimes they get cold and feel like they are wet.  Pt son stated that it has been going on for a couple of months now. Pt son stated her feet started off cold and wet and now its burning. Pt son also stated that pt has chest pain for about a week now. Pt son stated the chest pain happens every now and then when the pt eats something. Sent to access nurse

## 2021-01-26 NOTE — Telephone Encounter (Signed)
LM to triage. Per chart, patient has appt on 1/5

## 2021-01-27 NOTE — Telephone Encounter (Signed)
Spoke with patients son to confirm doing ok . No active chest pain, chest tightness, etc. Her feet feel like they are tingling and burning and when she eats sometimes she will have a mild pain in her chest described like her food is not going down. Not persistent. Patient doing ok today. Son would prefer to bring her in later in the day because it is hard for her to get up and get going at 7:40 in the AM so I have moved patient down to 11:40 tomorrow

## 2021-01-28 ENCOUNTER — Encounter: Payer: Self-pay | Admitting: Family

## 2021-01-28 ENCOUNTER — Ambulatory Visit (INDEPENDENT_AMBULATORY_CARE_PROVIDER_SITE_OTHER): Payer: Medicare Other | Admitting: Family

## 2021-01-28 ENCOUNTER — Other Ambulatory Visit: Payer: Self-pay

## 2021-01-28 VITALS — BP 138/78 | HR 89 | Temp 96.9°F | Ht 65.0 in | Wt 95.0 lb

## 2021-01-28 DIAGNOSIS — I498 Other specified cardiac arrhythmias: Secondary | ICD-10-CM | POA: Diagnosis not present

## 2021-01-28 DIAGNOSIS — R079 Chest pain, unspecified: Secondary | ICD-10-CM | POA: Diagnosis not present

## 2021-01-28 DIAGNOSIS — D509 Iron deficiency anemia, unspecified: Secondary | ICD-10-CM

## 2021-01-28 DIAGNOSIS — R251 Tremor, unspecified: Secondary | ICD-10-CM

## 2021-01-28 DIAGNOSIS — E440 Moderate protein-calorie malnutrition: Secondary | ICD-10-CM | POA: Diagnosis not present

## 2021-01-28 LAB — B12 AND FOLATE PANEL
Folate: >24.2 ng/mL (ref >5.9)
Vitamin B-12: 342 pg/mL (ref 211–911)

## 2021-01-28 LAB — COMPREHENSIVE METABOLIC PANEL
ALT: 9 U/L (ref 0–35)
AST: 23 U/L (ref 0–37)
Albumin: 4.2 g/dL (ref 3.5–5.2)
Alkaline Phosphatase: 71 U/L (ref 39–117)
BUN: 25 mg/dL — ABNORMAL HIGH (ref 6–23)
CO2: 32 mEq/L (ref 19–32)
Calcium: 9.6 mg/dL (ref 8.4–10.5)
Chloride: 102 mEq/L (ref 96–112)
Creatinine, Ser: 1.09 mg/dL (ref 0.40–1.20)
GFR: 44.29 mL/min — ABNORMAL LOW (ref >60.00)
Glucose, Bld: 84 mg/dL (ref 70–99)
Potassium: 4.3 mEq/L (ref 3.5–5.1)
Sodium: 140 mEq/L (ref 135–145)
Total Bilirubin: 0.5 mg/dL (ref 0.2–1.2)
Total Protein: 7 g/dL (ref 6.0–8.3)

## 2021-01-28 LAB — CBC WITH DIFFERENTIAL/PLATELET
Basophils Absolute: 0 10*3/uL (ref 0.0–0.1)
Basophils Relative: 0.8 % (ref 0.0–3.0)
Eosinophils Absolute: 0 10*3/uL (ref 0.0–0.7)
Eosinophils Relative: 0.7 % (ref 0.0–5.0)
HCT: 39.3 % (ref 36.0–46.0)
Hemoglobin: 12.9 g/dL (ref 12.0–15.0)
Lymphocytes Relative: 19.8 % (ref 12.0–46.0)
Lymphs Abs: 1 10*3/uL (ref 0.7–4.0)
MCHC: 32.9 g/dL (ref 30.0–36.0)
MCV: 92.7 fl (ref 78.0–100.0)
Monocytes Absolute: 0.3 10*3/uL (ref 0.1–1.0)
Monocytes Relative: 5.5 % (ref 3.0–12.0)
Neutro Abs: 3.5 10*3/uL (ref 1.4–7.7)
Neutrophils Relative %: 73.2 % (ref 43.0–77.0)
Platelets: 185 10*3/uL (ref 150.0–400.0)
RBC: 4.23 Mil/uL (ref 3.87–5.11)
RDW: 13.9 % (ref 11.5–15.5)
WBC: 4.8 10*3/uL (ref 4.0–10.5)

## 2021-01-28 LAB — IRON, TOTAL/TOTAL IRON BINDING CAP
%SAT: 23 % (calc) (ref 16–45)
Iron: 79 ug/dL (ref 45–160)
TIBC: 350 mcg/dL (calc) (ref 250–450)

## 2021-01-28 LAB — FERRITIN: Ferritin: 24.3 ng/mL (ref 10.0–291.0)

## 2021-01-28 LAB — TSH: TSH: 1.94 u[IU]/mL (ref 0.35–5.50)

## 2021-01-28 NOTE — Assessment & Plan Note (Signed)
Patient currently asymptomatic for chest pain but if this is to recur to immediately go to the emergency room and or call 911.  Referral to cardiologist in place.

## 2021-01-28 NOTE — Assessment & Plan Note (Signed)
EKG performed today in the office which showed sinus arrhythmia and possible left atrial enlargement.  There is a prolonged PR interval as well as T wave depression in V1.  I have discussed with the son as well as the patient that if she does again experience chest pain and/or start with shortness of breath she immediately is to go to the emergency room and or call 911.  I have placed an urgent referral for cardiologist if she does not hear in the next 1 to 2 days in regards to an appointment with a cardiologist please let us know.

## 2021-01-28 NOTE — Patient Instructions (Addendum)
Stop by the lab prior to leaving today. I will notify you of your results once received.   Abnormal EKG today on exam, referral placed to cardiologist for urgent consult. If chest pain and or shortness of breath pt to go to er and or call 911.   It was a pleasure seeing you today! Please do not hesitate to reach out with any questions and or concerns.  Regards,   Eugenia Pancoast FNP-C

## 2021-01-28 NOTE — Progress Notes (Signed)
Established Patient Office Visit  Subjective:  Patient ID: Rachel Martinez, female    DOB: [birth year]  Age: 88 y.o. MRN: [id number]  CC:  Chief Complaint  Patient presents with   Chest Pain   Poor Circulation    Feet    Shaking    HPI Rachel Martinez is here today with c/o chest pain accompanied by son. Last chest pain episode was about four days ago. When it occurs it feels 'funny' and like an extra beat, son states when she complains at home (as she lives with him) and tells him when it is happening that 'it hurts'.pt denies caffeine. States no heartburn symptoms.   She also states she has been shaking more often (confirmed by her son who states she has had 'shakes' for some time but seem a bit worse in the last two weeks. Hard to keep her hands steady.   Son states pt eats throughout the day, given protein shakes as well. Eats every 2-3 hours.  No new medications noted.  Past Medical History:  Diagnosis Date   Anemia    Chicken pox    CVA (cerebral vascular accident) ([place]) 8/06   Depression    GERD (gastroesophageal reflux disease)    Heart palpitations    Hypercholesteremia    Hypertension    Stroke [facility])     Past Surgical History:  Procedure Laterality Date   APPENDECTOMY      Family History  Problem Relation Age of Onset   CVA Mother    Diabetes Father    Heart disease Father    Cancer Sister        breast    Diabetes Sister    Cancer Brother        stomach   Leukemia Brother    Lung disease Brother    Hypertension Sister    Cancer Sister        stomach   Diabetes Sister    Mental illness Brother        suicide   Breast cancer Paternal Aunt    Diabetes Son     Social History   Socioeconomic History   Marital status: Married    Spouse name: Not on file   Number of children: Not on file   Years of education: Not on file   Highest education level: Not on file  Occupational History   Not on file  Tobacco Use   Smoking status: Former     Packs/day: 0.25    Years: 30.00    Pack years: 7.50    Types: Cigarettes    Quit date: 09/13/2003    Years since quitting: 17.3   Smokeless tobacco: Never  Vaping Use   Vaping Use: Never used  Substance and Sexual Activity   Alcohol use: No    Alcohol/week: 0.0 standard drinks   Drug use: No   Sexual activity: Never  Other Topics Concern   Not on file  Social History Narrative   Not on file   Social Determinants of Health   Financial Resource Strain: Low Risk    Difficulty of Paying Living Expenses: Not hard at all  Food Insecurity: No Food Insecurity   Worried About Charity fundraiser in the Last Year: Never true   Ricardo in the Last Year: Never true  Transportation Needs: No Transportation Needs   Lack of Transportation (Medical): No   Lack of Transportation (Non-Medical): No  Physical Activity: Not on file  Stress: No Stress Concern Present   Feeling of Stress : Not at all  Social Connections: Unknown   Frequency of Communication with Friends and Family: More than three times a week   Frequency of Social Gatherings with Friends and Family: Not on file   Attends Religious Services: Not on Electrical engineer or Organizations: Not on file   Attends Archivist Meetings: Not on file   Marital Status: Not on file  Intimate Partner Violence: Not At Risk   Fear of Current or Ex-Partner: No   Emotionally Abused: No   Physically Abused: No   Sexually Abused: No    Outpatient Medications Prior to Visit  Medication Sig Dispense Refill   ASPIRIN ADULT LOW DOSE PO Take 1 tablet by mouth daily.     calcium carbonate (TUMS - DOSED IN MG ELEMENTAL CALCIUM) 500 MG chewable tablet Chew 1 tablet by mouth 3 (three) times daily as needed.      donepezil (ARICEPT) 5 MG tablet Take 1 tablet (5 mg total) by mouth at bedtime. 90 tablet 1   No facility-administered medications prior to visit.    Allergies  Allergen Reactions   Amoxicillin-Pot Clavulanate      Other reaction(s): Unknown   Ciprofloxacin     Other reaction(s): Unknown   Codeine Sulfate    Imipenem     Other reaction(s): Unknown   Metronidazole     Other reaction(s): Unknown    ROS Review of Systems  Constitutional:  Negative for chills, fatigue and fever.  Respiratory:  Negative for cough, shortness of breath and wheezing.   Cardiovascular:  Positive for chest pain (intermittent) and palpitations (occsional 'feels funny'). Negative for leg swelling.  Neurological:  Positive for tremors. Negative for weakness (increasing lately bil hands).  Psychiatric/Behavioral:  Positive for confusion (occsaional confusion with dementia).      Objective:    Physical Exam Constitutional:      General: She is not in acute distress.    Appearance: She is not ill-appearing, toxic-appearing or diaphoretic.  HENT:     Head: Normocephalic.  Cardiovascular:     Rate and Rhythm: Normal rate. Rhythm irregular. Occasional Extrasystoles are present.    Pulses: Normal pulses.     Heart sounds: No murmur heard. Pulmonary:     Effort: Pulmonary effort is normal.     Breath sounds: Normal breath sounds.  Neurological:     Mental Status: She is alert.    BP 138/78    Pulse 89    Temp (!) 96.9 F (36.1 C) (Temporal)    Ht 5\' 5"  (1.651 m)    Wt 95 lb (43.1 kg)    SpO2 90%    BMI 15.81 kg/m  Wt Readings from Last 3 Encounters:  01/28/21 95 lb (43.1 kg)  11/06/20 92 lb 9.6 oz (42 kg)  06/19/20 100 lb (45.4 kg)     Health Maintenance Due  Topic Date Due   Pneumonia Vaccine 73+ Years old (2 - PCV) 10/23/2017   COVID-19 Vaccine (3 - Booster for [place] series) 02/07/2020    There are no preventive care reminders to display for this patient.  Lab Results  Component Value Date   TSH 1.85 11/06/2020   Lab Results  Component Value Date   WBC 4.0 11/06/2020   HGB 12.9 11/06/2020   HCT 38.8 11/06/2020   MCV 91.2 11/06/2020   PLT 184.0 11/06/2020   Lab Results  Component Value  Date  NA 138 11/06/2020   K 4.4 11/06/2020   CO2 31 11/06/2020   GLUCOSE 89 11/06/2020   BUN 25 (H) 11/06/2020   CREATININE 1.03 11/06/2020   BILITOT 0.6 11/06/2020   ALKPHOS 62 11/06/2020   AST 21 11/06/2020   ALT 7 11/06/2020   PROT 6.4 11/06/2020   ALBUMIN 4.2 11/06/2020   CALCIUM 9.4 11/06/2020   ANIONGAP 7 10/22/2016   GFR 47.48 (L) 11/06/2020   Lab Results  Component Value Date   HGBA1C 5.4 10/29/2018      Assessment & Plan:   Problem List Items Addressed This Visit       Other   Iron deficiency anemia   Relevant Orders   CBC w/Diff   Iron, Total/Total Iron Binding Cap   Ferritin   B12 and Folate Panel   Sinus arrhythmia seen on electrocardiogram    Patient currently asymptomatic for chest pain but if this is to recur to immediately go to the emergency room and or call 911.  Referral to cardiologist in place.      Relevant Orders   Ambulatory referral to Cardiology   Moderate protein-calorie malnutrition ([place])    Continue with increased protein shakes as well as eating every 2-3 hours throughout the day.  Monitor weight daily      Relevant Orders   B12 and Folate Panel   Occasional tremors    Lab work ordered to rule out worsening iron deficiency anemia, B12 deficiency and/or thyroid abnormalities.  Pending results.  Instructed son and patient to eat 2-3 times daily to avoid dips in gluten and sugar levels.      Relevant Orders   TSH   B12 and Folate Panel   Comprehensive metabolic panel   Chest pain in adult - Primary    EKG performed today in the office which showed sinus arrhythmia and possible left atrial enlargement.  There is a prolonged PR interval as well as T wave depression in V1.  I have discussed with the son as well as the patient that if she does again experience chest pain and/or start with shortness of breath she immediately is to go to the emergency room and or call 911.  I have placed an urgent referral for cardiologist if she does not  hear in the next 1 to 2 days in regards to an appointment with a cardiologist please let us know.      Relevant Orders   EKG 12-Lead   CBC w/Diff   Comprehensive metabolic panel   Ambulatory referral to Cardiology    No orders of the defined types were placed in this encounter.   Follow-up: Return in about 1 month (around 02/28/2021), or if symptoms worsen or fail to improve, for with PCP .    Eugenia Pancoast, FNP

## 2021-01-28 NOTE — Assessment & Plan Note (Signed)
Lab work ordered to rule out worsening iron deficiency anemia, B12 deficiency and/or thyroid abnormalities.  Pending results.  Instructed son and patient to eat 2-3 times daily to avoid dips in gluten and sugar levels.

## 2021-01-28 NOTE — Assessment & Plan Note (Signed)
Continue with increased protein shakes as well as eating every 2-3 hours throughout the day.  Monitor weight daily

## 2021-02-03 ENCOUNTER — Other Ambulatory Visit: Payer: Self-pay

## 2021-02-03 ENCOUNTER — Ambulatory Visit (INDEPENDENT_AMBULATORY_CARE_PROVIDER_SITE_OTHER): Payer: Medicare Other | Admitting: Cardiovascular Disease

## 2021-02-03 ENCOUNTER — Encounter: Payer: Self-pay | Admitting: Cardiovascular Disease

## 2021-02-03 ENCOUNTER — Ambulatory Visit: Payer: Medicare Other

## 2021-02-03 VITALS — BP 140/52 | HR 74 | Ht 60.0 in | Wt 95.4 lb

## 2021-02-03 DIAGNOSIS — I498 Other specified cardiac arrhythmias: Secondary | ICD-10-CM

## 2021-02-03 DIAGNOSIS — I479 Paroxysmal tachycardia, unspecified: Secondary | ICD-10-CM

## 2021-02-03 DIAGNOSIS — R079 Chest pain, unspecified: Secondary | ICD-10-CM

## 2021-02-03 DIAGNOSIS — E440 Moderate protein-calorie malnutrition: Secondary | ICD-10-CM

## 2021-02-03 DIAGNOSIS — R002 Palpitations: Secondary | ICD-10-CM

## 2021-02-03 DIAGNOSIS — I4729 Other ventricular tachycardia: Secondary | ICD-10-CM

## 2021-02-03 DIAGNOSIS — F03B0 Unspecified dementia, moderate, without behavioral disturbance, psychotic disturbance, mood disturbance, and anxiety: Secondary | ICD-10-CM

## 2021-02-03 NOTE — Progress Notes (Signed)
Cardiology Office Note  Date:  02/03/2021   ID:  Rachel Martinez, DOB 1927/03/11, MRN [id number]  PCP:  Rachel Pheasant, MD   Chief Complaint  Patient presents with   New Patient (Initial Visit)    Ref by Dr. Einar Martinez for chest pain, arrhythmia/palpitations. Medications reviewed by the patient's son Rachel Martinez) verbally.     HPI:  [name] is a 88 year old woman with past medical history of Dementia Palpitations, chest pain Protein calorie malnutrition Who presents by referral from Dr. Einar Martinez for chest pain, arrhythmia/palpitations  Previously referred to cardiology October 2020, loss of consciousness, bradycardia, no showed the appointment  Refer to neurology October 2018, found down, altered mental status  Echocardiogram October 2018 normal ejection fraction  She presents today with her son Currently living with son, daughter-in-law He reports that she is frail, appetite is okay but she has been losing weight No falls but does have unsteady gait at times Periodically will report palpitations, funny feeling in her chest On discussion of whether she is having chest pain, reports she is able to ambulate fine with no chest pain She will go for car rides, does not seem to venture into the stores or go shopping much  EKG personally reviewed by myself on todays visit Normal sinus rhythm with sinus arrhythmia rate 74 bpm no significant ST-T wave changes   PMH:   has a past medical history of Anemia, Chicken pox, CVA (cerebral vascular accident) ([place]) (8/06), Depression, GERD (gastroesophageal reflux disease), Heart palpitations, Hypercholesteremia, Hypertension, and Stroke ([place]).  PSH:    Past Surgical History:  Procedure Laterality Date   APPENDECTOMY      Current Outpatient Medications  Medication Sig Dispense Refill   ASPIRIN ADULT LOW DOSE PO Take 1 tablet by mouth daily.     calcium carbonate (TUMS - DOSED IN MG ELEMENTAL CALCIUM) 500 MG chewable  tablet Chew 1 tablet by mouth 3 (three) times daily as needed.      donepezil (ARICEPT) 5 MG tablet Take 1 tablet (5 mg total) by mouth at bedtime. 90 tablet 1   No current facility-administered medications for this visit.     Allergies:   Amoxicillin-pot clavulanate, Ciprofloxacin, Codeine sulfate, Imipenem, and Metronidazole   Social History:  The patient  reports that she quit smoking about 17 years ago. Her smoking use included cigarettes. She has a 7.50 pack-year smoking history. She has never used smokeless tobacco. She reports that she does not drink alcohol and does not use drugs.   Family History:   family history includes Breast cancer in her paternal aunt; CVA in her mother; Cancer in her brother, sister, and sister; Diabetes in her father, sister, sister, and son; Heart disease in her father; Hypertension in her sister; Leukemia in her brother; Lung disease in her brother; Mental illness in her brother.    Review of Systems: Review of Systems  Constitutional: Negative.   HENT: Negative.    Respiratory: Negative.    Cardiovascular: Negative.   Gastrointestinal: Negative.   Musculoskeletal: Negative.   Neurological: Negative.   Psychiatric/Behavioral: Negative.    All other systems reviewed and are negative.   PHYSICAL EXAM: VS:  BP (!) 140/52 (BP Location: Right Arm, Patient Position: Sitting, Cuff Size: Normal)    Pulse 74    Ht 5' (1.524 m)    Wt 95 lb 6 oz (43.3 kg)    SpO2 98%    BMI 18.63 kg/m  , BMI Body mass index is 18.63  kg/m. GEN: n no acute distress, frail, thin HEENT: normal Neck: no JVD, carotid bruits, or masses Cardiac: RRR; no murmurs, rubs, or gallops,no edema  Respiratory:  clear to auscultation bilaterally, normal work of breathing GI: soft, nontender, nondistended, + BS MS: no deformity or atrophy Skin: warm and dry, no rash Neuro:  Strength and sensation are intact Psych: euthymic mood, full affect  Recent Labs: 01/28/2021: ALT 9; BUN 25;  Creatinine, Ser 1.09; Hemoglobin 12.9; Platelets 185.0; Potassium 4.3; Sodium 140; TSH 1.94    Lipid Panel Lab Results  Component Value Date   CHOL 227 (H) 11/06/2020   HDL 74.30 11/06/2020   LDLCALC 130 (H) 11/06/2020   TRIG 114.0 11/06/2020      Wt Readings from Last 3 Encounters:  02/03/21 95 lb 6 oz (43.3 kg)  01/28/21 95 lb (43.1 kg)  11/06/20 92 lb 9.6 oz (42 kg)       ASSESSMENT AND PLAN:  Problem List Items Addressed This Visit   None Visit Diagnoses     Paroxysmal tachycardia (HCC)    -  Primary   Relevant Orders   LONG TERM MONITOR (3-14 DAYS)   Palpitations       Relevant Orders   EKG 12-Lead   LONG TERM MONITOR (3-14 DAYS)   Other cardiac arrhythmia       Relevant Orders   EKG 12-Lead   LONG TERM MONITOR (3-14 DAYS)   Chest pain of uncertain etiology          Palpitations Etiology unclear, sinus arrhythmia noted on EKG Unable to exclude other arrhythmia such as atrial fibrillation We have ordered a ZIO monitor for further evaluation  Chest pain On discussion of whether she is having chest pain, very vague details, Denies having any symptoms on exertion Reports more of a "funny feeling" in her chest, ZIO ordered as above Given her age, frailty, not a great candidate for ischemic work-up Long discussion with patient's son, he will closely monitor symptoms for now and if clear chest pain symptoms escalate, we would need to regroup and discuss whether ischemic work-up would be pursued  Protein calorie malnutrition Discussed diet, need to increase calorie intake  Dementia Son reports memory loss is moderate On Aricept    Total encounter time more than 45 minutes  Greater than 50% was spent in counseling and coordination of care with the patient    Signed, Rachel Martinez, M.D., Ph.D. Rachel Martinez, Rachel Martinez

## 2021-02-03 NOTE — Patient Instructions (Addendum)
Medication Instructions:  No changes  If you need a refill on your cardiac medications before your next appointment, please call your pharmacy.   Lab work: No new labs needed  Testing/Procedures: Heart monitor (Zio patch) for 2 weeks (14 days)   Your physician has recommended that you wear a Zio monitor. This monitor is a medical device that records the hearts electrical activity. Doctors most often use these monitors to diagnose arrhythmias. Arrhythmias are problems with the speed or rhythm of the heartbeat. The monitor is a small device applied to your chest. You can wear one while you do your normal daily activities. While wearing this monitor if you have any symptoms to push the button and record what you felt. Once you have worn this monitor for the period of time provider prescribed (Usually 14 days), you will return the monitor device in the postage paid box. Once it is returned they will download the data collected and provide [place] with a report which the provider will then review and we will call you with those results. Important tips:  Avoid showering during the first 24 hours of wearing the monitor. Avoid excessive sweating to help maximize wear time. Do not submerge the device, no hot tubs, and no swimming pools. Keep any lotions or oils away from the patch. After 24 hours you may shower with the patch on. Take brief showers with your back facing the shower head.  Do not remove patch once it has been placed because that will interrupt data and decrease adhesive wear time. Push the button when you have any symptoms and write down what you were feeling. Once you have completed wearing your monitor, remove and place into box which has postage paid and place in your outgoing mailbox.  If for some reason you have misplaced your box then call our office and we can provide another box and/or mail it off for you.   Follow-Up: At [facility], you and your health needs are our priority.   As part of our continuing mission to provide you with exceptional heart care, we have created designated Provider Care Teams.  These Care Teams include your primary Cardiologist (physician) and Advanced Practice Providers (APPs -  Physician Assistants and Nurse Practitioners) who all work together to provide you with the care you need, when you need it.  You will need a follow up appointment as needed  Providers on your designated Care Team:   Murray Hodgkins, NP Christell Faith, PA-C Cadence Kathlen Mody, [place]  COVID-19 Vaccine Information can be found at: [email or web address].co.uk For questions related to vaccine distribution or appointments, please email vaccine@[email or web address] .com or call [phone].

## 2021-02-04 ENCOUNTER — Telehealth: Payer: Self-pay | Admitting: Internal Medicine

## 2021-02-04 DIAGNOSIS — L600 Ingrowing nail: Secondary | ICD-10-CM

## 2021-02-04 NOTE — Telephone Encounter (Signed)
Ok to refer to podiatry 

## 2021-02-04 NOTE — Telephone Encounter (Signed)
Pt son called in requesting a referral for podiatry for Pt. Pt son stated that Pt have an ingrown toe nail that needs to be remove. Pt son stated that if anyone can remove Pt ingrown toe nail that would be great. Pt requesting callback.

## 2021-02-04 NOTE — Telephone Encounter (Signed)
Ok for referral?

## 2021-02-05 NOTE — Telephone Encounter (Signed)
Referral placed. Son is aware.

## 2021-02-08 ENCOUNTER — Encounter: Payer: Self-pay | Admitting: Oncology

## 2021-02-09 ENCOUNTER — Ambulatory Visit (INDEPENDENT_AMBULATORY_CARE_PROVIDER_SITE_OTHER): Payer: Medicare Other | Admitting: Podiatry

## 2021-02-09 ENCOUNTER — Other Ambulatory Visit: Payer: Self-pay

## 2021-02-09 ENCOUNTER — Encounter: Payer: Self-pay | Admitting: Podiatry

## 2021-02-09 DIAGNOSIS — M79674 Pain in right toe(s): Secondary | ICD-10-CM

## 2021-02-09 DIAGNOSIS — M79675 Pain in left toe(s): Secondary | ICD-10-CM

## 2021-02-09 DIAGNOSIS — B351 Tinea unguium: Secondary | ICD-10-CM

## 2021-02-09 NOTE — Progress Notes (Signed)
°  Subjective:  Patient ID: Rachel Martinez, female    DOB: [birth year],  MRN: [id number]  Chief Complaint  Patient presents with   Nail Problem    Nail trim    88 y.o. female returns for the above complaint.  Patient presents with thickened elongated dystrophic toenails x10.  Mild pain on palpation.  Patient would like to have them debrided down.  She states can start to cause her ingrown.  She denies any other acute complaints.  She has not seen anyone else prior to seeing me.  Objective:  There were no vitals filed for this visit. Podiatric Exam: Vascular: dorsalis pedis and posterior tibial pulses are palpable bilateral. Capillary return is immediate. Temperature gradient is WNL. Skin turgor WNL  Sensorium: Normal Semmes Weinstein monofilament test. Normal tactile sensation bilaterally. Nail Exam: Pt has thick disfigured discolored nails with subungual debris noted bilateral entire nail hallux through fifth toenails.  Pain on palpation to the nails. Ulcer Exam: There is no evidence of ulcer or pre-ulcerative changes or infection. Orthopedic Exam: Muscle tone and strength are WNL. No limitations in general ROM. No crepitus or effusions noted.  Skin: No Porokeratosis. No infection or ulcers    Assessment & Plan:   1. Pain due to onychomycosis of toenails of both feet     Patient was evaluated and treated and all questions answered.  Onychomycosis with pain  -Nails palliatively debrided as below. -Educated on self-care  Procedure: Nail Debridement Rationale: pain  Type of Debridement: manual, sharp debridement. Instrumentation: Nail nipper, rotary burr. Number of Nails: 10  Procedures and Treatment: Consent by patient was obtained for treatment procedures. The patient understood the discussion of treatment and procedures well. All questions were answered thoroughly reviewed. Debridement of mycotic and hypertrophic toenails, 1 through 5 bilateral and clearing of subungual debris.  No ulceration, no infection noted.  Return Visit-Office Procedure: Patient instructed to return to the office for a follow up visit 3 months for continued evaluation and treatment.  Boneta Lucks, DPM    Return in about 3 months (around 05/10/2021) for [facility] .

## 2021-02-12 ENCOUNTER — Telehealth: Payer: Self-pay | Admitting: Cardiovascular Disease

## 2021-02-12 NOTE — Telephone Encounter (Signed)
Returned the call to the patient's son Rachel Martinez. Lmom. They should contact [employer] to trouble shoot and make sure the the zio monitor is still transmitting. Adv them to contact the office if any assistance is needed.

## 2021-02-12 NOTE — Telephone Encounter (Signed)
Patient's son states monitor came off yesterday. He states he is not sure if it will work out with her wearing it. Please call to discuss.

## 2021-02-15 DIAGNOSIS — Z20822 Contact with and (suspected) exposure to covid-19: Secondary | ICD-10-CM | POA: Diagnosis not present

## 2021-02-18 ENCOUNTER — Telehealth: Payer: Self-pay | Admitting: Cardiovascular Disease

## 2021-02-18 NOTE — Telephone Encounter (Signed)
Patient's son states patient's heart feels like it is "pounding"  STAT if HR is under 50 or over 120 (normal HR is 60-100 beats per minute)  What is your heart rate? 74-90  Do you have a log of your heart rate readings (document readings)? No, "pounding" has been going on about a month, feels like it is getting worse  Do you have any other symptoms? When she feels the pounding, she feels a little sweaty.  Please call to discuss.

## 2021-02-18 NOTE — Telephone Encounter (Signed)
Attempted to reach back out to Rachel Martinez's son Rachel Martinez [facility] approved), unable to make contact.  LMTCB

## 2021-02-19 NOTE — Telephone Encounter (Signed)
Son, Ronalee Belts returning call

## 2021-02-19 NOTE — Telephone Encounter (Signed)
Attempted to return call again to [name]'s son Rachel Martinez (DPR approved), unable to make contact, phone goes to VM.  LMTCB

## 2021-02-22 NOTE — Telephone Encounter (Signed)
3rd attempted to return call again to Mrs. Firestine's son Ronalee Belts (DPR approved), unable to make contact with pt's son Left message to call back if still having concerns regarding pt HR  Pt has monitor in place, ZIO

## 2021-03-04 DIAGNOSIS — Z20822 Contact with and (suspected) exposure to covid-19: Secondary | ICD-10-CM | POA: Diagnosis not present

## 2021-03-05 IMAGING — CT CT HEAD WITHOUT CONTRAST
5 of 7 series · 16 of 47 positions shown, 17 images · non-contrast
Comparison: CT head 10/22/2016.  MRI brain 10/22/2016.

CLINICAL DATA: Fall last night.  Head injury.

EXAM:
CT HEAD WITHOUT CONTRAST
CT CERVICAL SPINE WITHOUT CONTRAST
TECHNIQUE: Multidetector CT imaging of the head and cervical spine was
performed following the standard protocol without intravenous
contrast. Multiplanar CT image reconstructions of the cervical spine
were also generated.

[Series 2: head wo · axial · 0.47mm/px · z∈[+353,+428]mm · 3 of 31 slices shown, 4 images]
[im 8/31  brain]
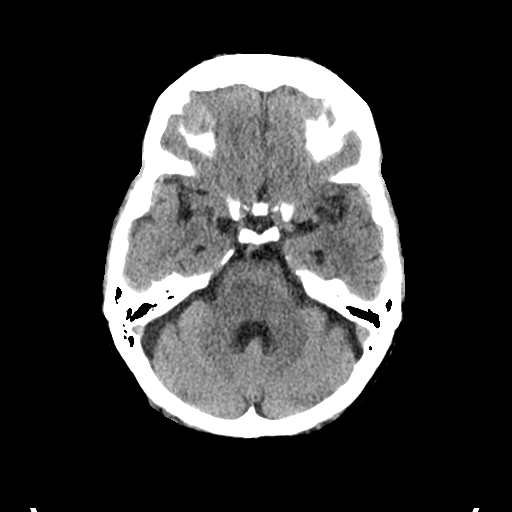
[im 8/31  bone]
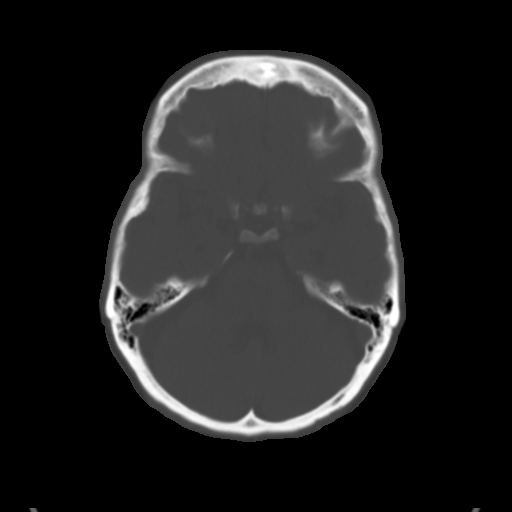
[im 16/31  brain]
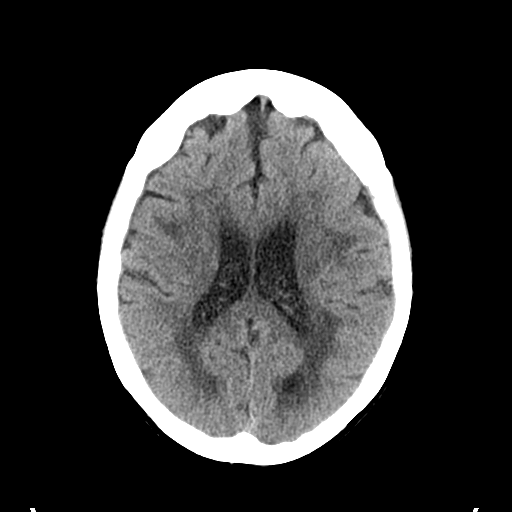
[im 23/31  brain]
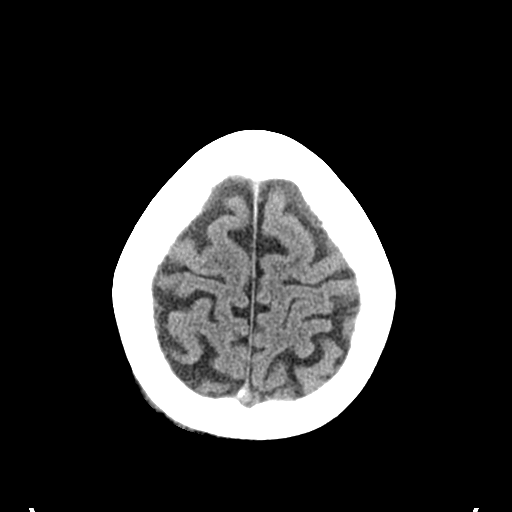

[Series 4: coronal soft tissue · coronal · 0.30mm/px · 2 of 62 slices shown]
[im 12/62  brain]
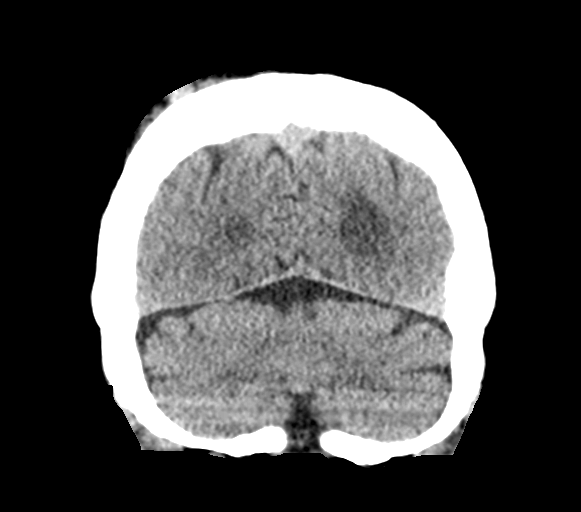
[im 23/62  brain]
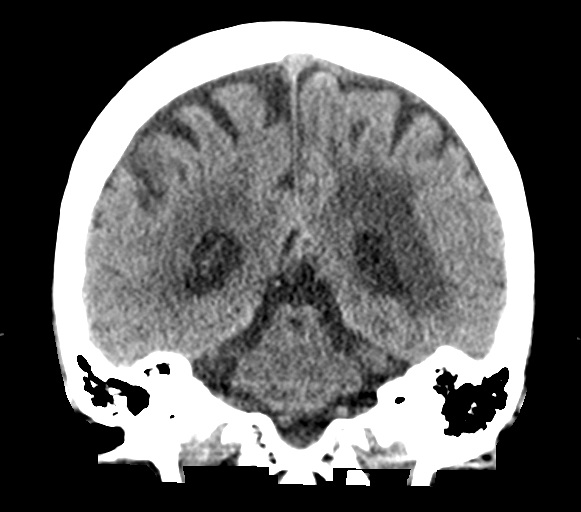

[Series 5: sagittal soft tissue · sagittal · 0.30mm/px · 1 of 50 slices shown]
[im 25/50  brain]
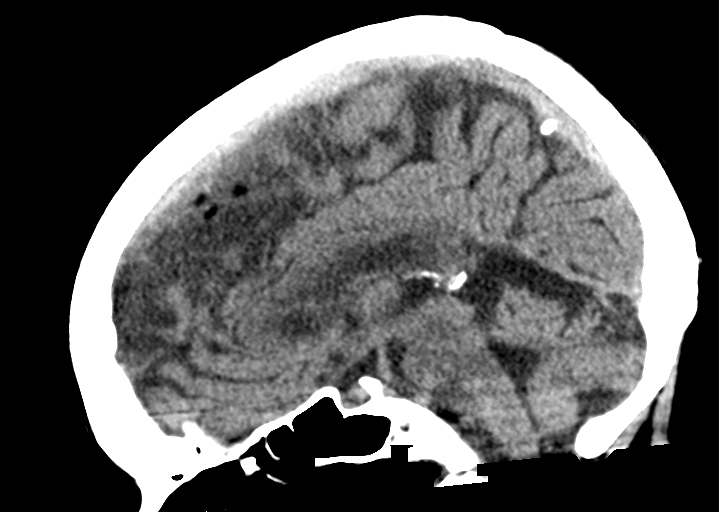

[Series 7: c spine soft · axial · 0.28mm/px · z∈[+190,+204]mm · 2 of 74 slices shown]
[im 8/74  brain]
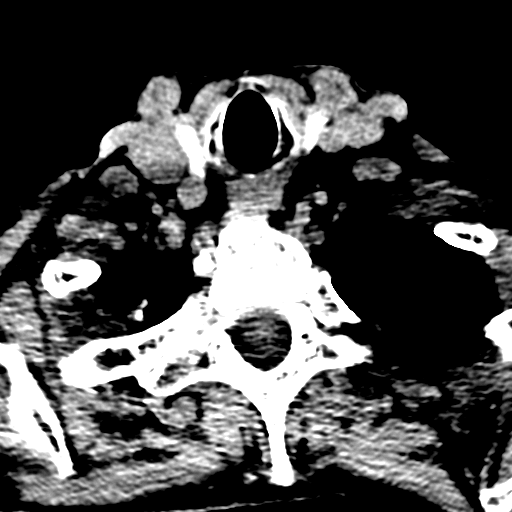
[im 15/74  brain]
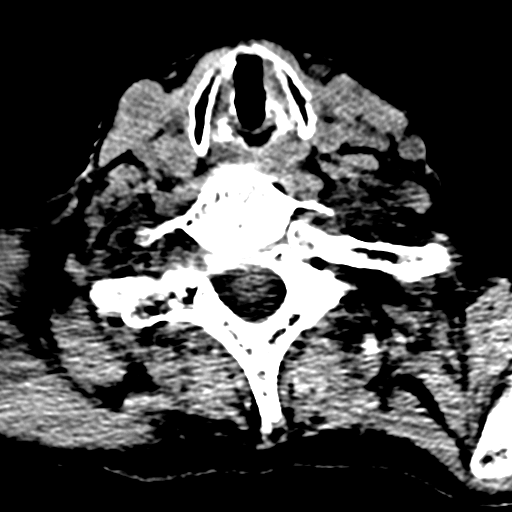

[Series 10: orthogonal bone · axial · 0.25mm/px · z∈[+151,+295]mm · 8 of 93 slices shown]
[im 8/93  bone]
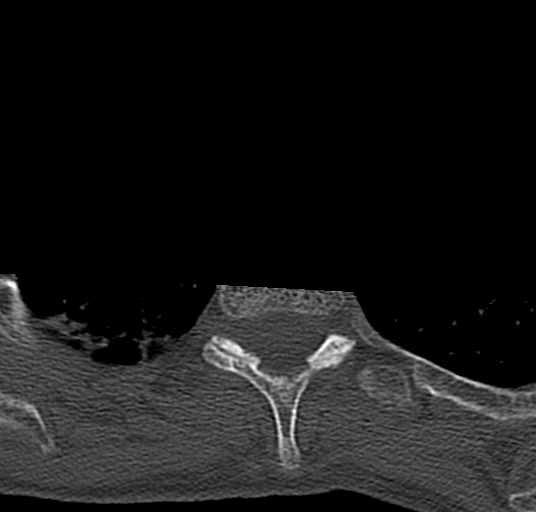
[im 22/93  bone]
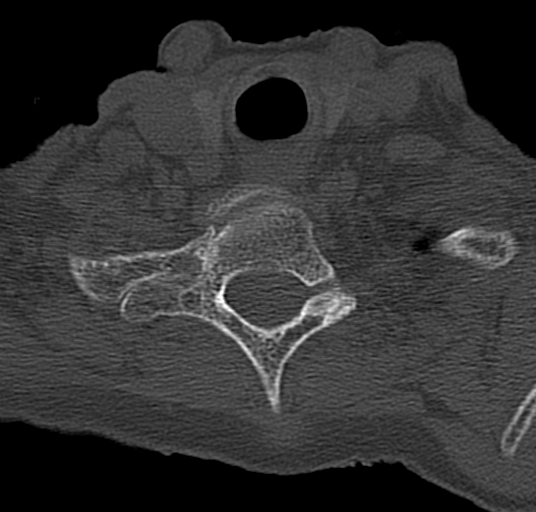
[im 29/93  bone]
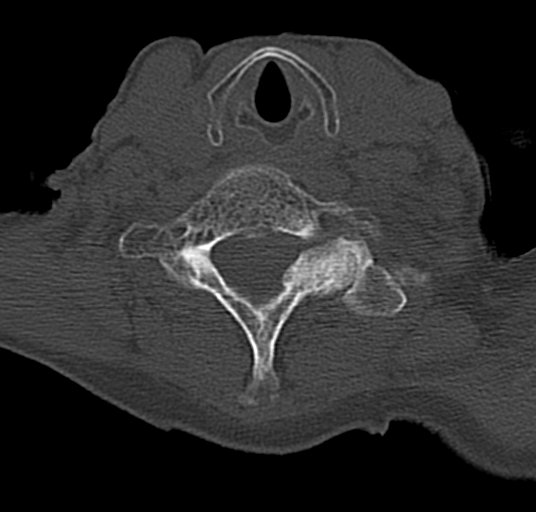
[im 43/93  bone]
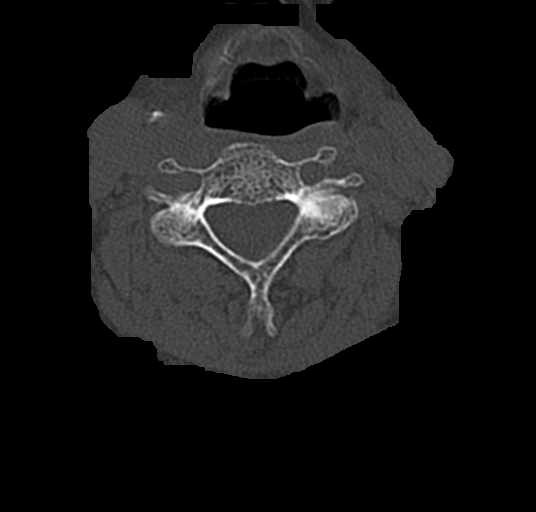
[im 50/93  bone]
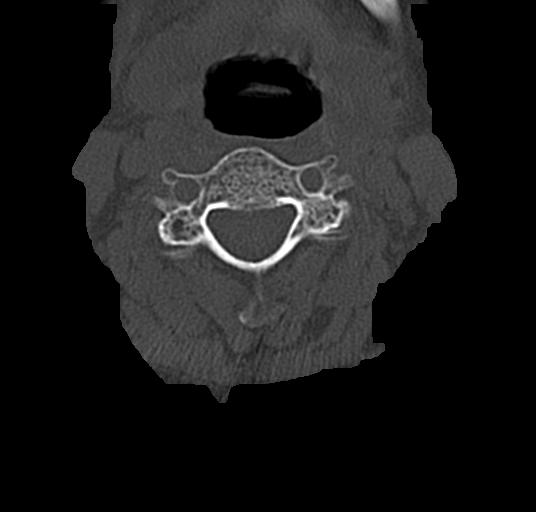
[im 64/93  bone]
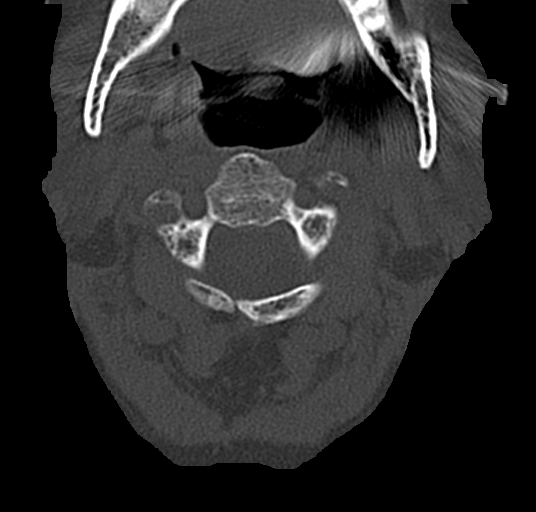
[im 71/93  bone]
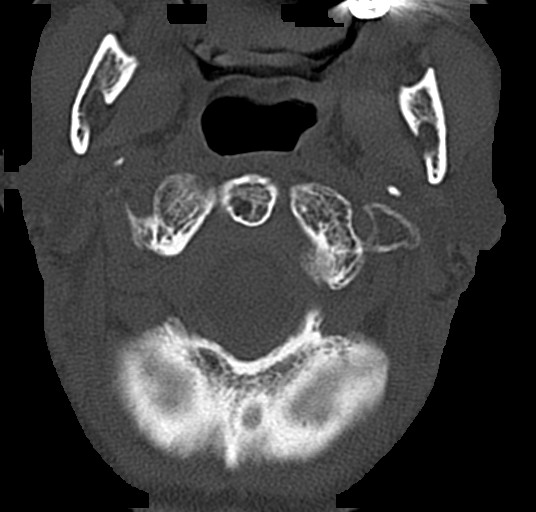
[im 85/93  bone]
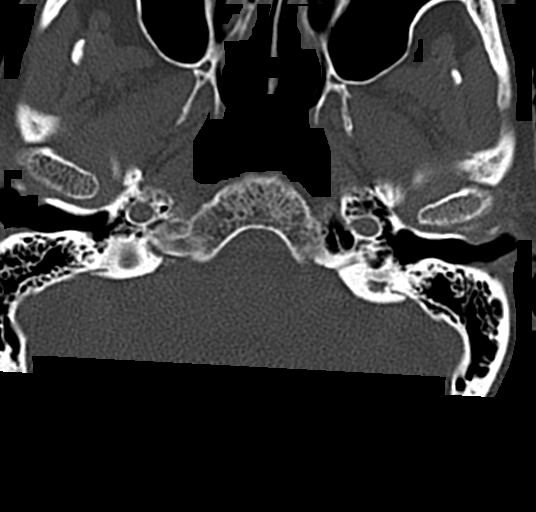

[16 of 47 positions shown; findings below may reference images not displayed]

FINDINGS: CT HEAD FINDINGS

Brain: There is no evidence of acute intracranial hemorrhage, mass
lesion, brain edema or extra-axial fluid collection. There is stable
mild atrophy with prominence of the ventricles and subarachnoid
spaces. Patchy and confluent low-density is present in the
periventricular and subcortical white matter, similar to previous
study. There is no CT evidence of acute cortical infarction.

Vascular: Prominent intracranial vascular calcifications. No
hyperdense vessel identified.

Skull: Negative for fracture or focal lesion.

Sinuses/Orbits: There is a stable calcified mass within the frontal
sinuses consistent with an osteoma. The paranasal sinuses are
otherwise clear without air-fluid levels. The mastoid air cells and
middle ears are clear. Previous bilateral lens surgery without acute
orbital findings.

Other: Right parietal scalp soft tissue injury.

CT CERVICAL SPINE FINDINGS

Alignment: Normal.

Skull base and vertebrae: No evidence of acute fracture or traumatic
subluxation. The posterior arch of C1 is incomplete.

Soft tissues and spinal canal: No prevertebral fluid or swelling. No
visible canal hematoma.

Disc levels: Mild multilevel spondylosis for age with mild uncinate
spurring and facet hypertrophy. No large disc herniation or
high-grade spinal stenosis identified.

Upper chest: Mild biapical scarring.

Other: None.
IMPRESSION: 1. Right parietal scalp soft tissue injury without evidence of
calvarial fracture.
2. No acute intracranial findings. Stable atrophy and chronic small
vessel ischemic changes.
3. No evidence of acute cervical spine fracture, traumatic
subluxation or static signs of instability. Mild cervical
spondylosis.

## 2021-03-09 ENCOUNTER — Encounter: Payer: Self-pay | Admitting: Podiatry

## 2021-03-09 ENCOUNTER — Other Ambulatory Visit: Payer: Self-pay

## 2021-03-09 ENCOUNTER — Ambulatory Visit (INDEPENDENT_AMBULATORY_CARE_PROVIDER_SITE_OTHER): Payer: Medicare Other | Admitting: Podiatry

## 2021-03-09 DIAGNOSIS — L600 Ingrowing nail: Secondary | ICD-10-CM

## 2021-03-09 NOTE — Progress Notes (Signed)
Subjective:  Patient ID: Rachel Martinez, female    DOB: 07/30/1927,  MRN: [id number]  No chief complaint on file.   88 y.o. female presents with the above complaint.  Patient presents with complaint left hallux lateral border ingrown.  Patient states painful to touch.  Patient would like to have it have removed.  She is not a diabetic.  She is a hurts with ambulation hurts with certain type of shoes.  She has tried soaking and taking a pressure off but none of which has helped.  There is no redness present.  She states pain scale is 5 out of 10 hurts with ambulation.   Review of Systems: Negative except as noted in the HPI. Denies N/V/F/Ch.  Past Medical History:  Diagnosis Date   Anemia    Chicken pox    CVA (cerebral vascular accident) ([place]) 8/06   Depression    GERD (gastroesophageal reflux disease)    Heart palpitations    Hypercholesteremia    Hypertension    Stroke [facility])     Current Outpatient Medications:    ASPIRIN ADULT LOW DOSE PO, Take 1 tablet by mouth daily., Disp: , Rfl:    calcium carbonate (TUMS - DOSED IN MG ELEMENTAL CALCIUM) 500 MG chewable tablet, Chew 1 tablet by mouth 3 (three) times daily as needed. , Disp: , Rfl:    donepezil (ARICEPT) 5 MG tablet, Take 1 tablet (5 mg total) by mouth at bedtime., Disp: 90 tablet, Rfl: 1  Social History   Tobacco Use  Smoking Status Former   Packs/day: 0.25   Years: 30.00   Pack years: 7.50   Types: Cigarettes   Quit date: 09/13/2003   Years since quitting: 17.4  Smokeless Tobacco Never    Allergies  Allergen Reactions   Amoxicillin-Pot Clavulanate     Other reaction(s): Unknown   Ciprofloxacin     Other reaction(s): Unknown   Codeine Sulfate    Imipenem     Other reaction(s): Unknown   Metronidazole     Other reaction(s): Unknown   Objective:  There were no vitals filed for this visit. There is no height or weight on file to calculate BMI. Constitutional Well developed. Well nourished.  Vascular  Dorsalis pedis pulses palpable bilaterally. Posterior tibial pulses palpable bilaterally. Capillary refill normal to all digits.  No cyanosis or clubbing noted. Pedal hair growth normal.  Neurologic Normal speech. Oriented to person, place, and time. Epicritic sensation to light touch grossly present bilaterally.  Dermatologic Painful ingrowing nail at lateral nail borders of the hallux nail left. No other open wounds. No skin lesions.  Orthopedic: Normal joint ROM without pain or crepitus bilaterally. No visible deformities. No bony tenderness.   Radiographs: None Assessment:   1. Ingrown left big toenail    Plan:  Patient was evaluated and treated and all questions answered.  Ingrown Nail, left -Patient elects to proceed with minor surgery to remove ingrown toenail removal today. Consent reviewed and signed by patient. -Ingrown nail excised. See procedure note. -Educated on post-procedure care including soaking. Written instructions provided and reviewed. -Patient to follow up in 2 weeks for nail check.  Procedure: Excision of Ingrown Toenail Location: Left 1st toe lateral nail borders. Anesthesia: Lidocaine 1% plain; 1.5 mL and Marcaine 0.5% plain; 1.5 mL, digital block. Skin Prep: Betadine. Dressing: Silvadene; telfa; dry, sterile, compression dressing. Technique: Following skin prep, the toe was exsanguinated and a tourniquet was secured at the base of the toe. The affected nail border was  freed, split with a nail splitter, and excised. Chemical matrixectomy was then performed with phenol and irrigated out with alcohol. The tourniquet was then removed and sterile dressing applied. Disposition: Patient tolerated procedure well. Patient to return in 2 weeks for follow-up.   No follow-ups on file.

## 2021-03-12 ENCOUNTER — Other Ambulatory Visit: Payer: Self-pay | Admitting: Internal Medicine

## 2021-03-16 ENCOUNTER — Ambulatory Visit: Payer: BLUE CROSS/BLUE SHIELD | Admitting: Podiatry

## 2021-03-16 NOTE — Progress Notes (Signed)
Cardiology Office Note    Date:  03/17/2021   ID:  Rachel Martinez, DOB 1927-07-13, MRN [id number]  PCP:  Einar Pheasant, MD  Cardiologist:  Ida Rogue, MD  Electrophysiologist:  None   Chief Complaint: Palpitations   History of Present Illness:   Rachel Martinez is a 88 y.o. female with history of palpitations, chest pain, dementia, CVA, RICA stenosis, HTN, HLD, anemia with prior IV iron infusions, depression, and fall risk who presents for evaluation of continued palpitations.  Carotid artery ultrasound in 03/2013 demonstrated RICA stenosis of 1-49% and no hemodynamically significant stenosis within the LICA.   She was admitted in 0623 with AMS of uncertain etiology and fall. Echo in 10/2016 demonstrated an EF of 606-5%, normal wall motion, Gr1DD, mild mitral regurgitation, calcified mitral annulus, moderate tricuspid regurgitation, and a PASP of 38 mmHg. EEG was unrevealing. MRI brain without acute finding.   She established care with Dr. Rockey Situ in 01/2021 for evaluation of palpitations. At that time, family reported the patient was frail and had been losing weight.  Her appetite was noted to be ok. She denied episodes of chest pain. EKG showed sinus rhythm with ectopic atrial beats, 74 bpm, and no significant st/t changes. She was not felt to be a good candidate for ischemic workup given advanced age, frail state, and underlying dementia. Zio patch was recommended, though this fell off and patient's son had concern regarding the patient's ability to wear this device.   She comes in accompanied by her son today who provides some of the history.  She continues to note unchanged palpitations that seem to occur about every other day and will last for approximately 1 hour.  She is uncertain if she has chest pressure with these episodes or if she is feeling palpitations.  She is otherwise without palpitations.  Her main concern at this time appears to be upper extremity tremors.  No  dizziness, presyncope, or syncope associated with her symptoms.  She is currently without chest pain or palpitations.  Her weight has been stable.  No lower extremity swelling or orthopnea.  Son is interested in conservative therapeutic measures.  Would prefer to avoid invasive testing.   Labs independently reviewed: 01/2021 - potassium 4.3, BUN 25, serum creatinine 1.09, albumin 4.2, AST/ALT normal, TSH normal, Hgb 12.9, PLT 185 10/2020 - TC 227, TG 114, HDL 74, LDL 130 10/2018 - A1c 5.4  Past Medical History:  Diagnosis Date   Anemia    Chicken pox    CVA (cerebral vascular accident) ([place]) 8/06   Depression    GERD (gastroesophageal reflux disease)    Heart palpitations    Hypercholesteremia    Hypertension    Stroke [facility])     Past Surgical History:  Procedure Laterality Date   APPENDECTOMY      Current Medications: Current Meds  Medication Sig   ASPIRIN ADULT LOW DOSE PO Take 1 tablet by mouth daily.   calcium carbonate (TUMS - DOSED IN MG ELEMENTAL CALCIUM) 500 MG chewable tablet Chew 1 tablet by mouth 3 (three) times daily as needed.    Cyanocobalamin (VITAMIN B 12 PO) Take 100 mcg by mouth daily.   donepezil (ARICEPT) 5 MG tablet TAKE ONE TABLET BY MOUTH AT BEDTIME   propranolol (INDERAL) 10 MG tablet Take 1 tablet (10 mg total) by mouth 2 (two) times daily as needed (As needed for palpitations).    Allergies:   Amoxicillin-pot clavulanate, Ciprofloxacin, Codeine sulfate, Imipenem, and Metronidazole  Social History   Socioeconomic History   Marital status: Married    Spouse name: Not on file   Number of children: Not on file   Years of education: Not on file   Highest education level: Not on file  Occupational History   Not on file  Tobacco Use   Smoking status: Former    Packs/day: 0.25    Years: 30.00    Pack years: 7.50    Types: Cigarettes    Quit date: 09/13/2003    Years since quitting: 17.5   Smokeless tobacco: Never  Vaping Use   Vaping Use:  Never used  Substance and Sexual Activity   Alcohol use: No    Alcohol/week: 0.0 standard drinks   Drug use: No   Sexual activity: Never  Other Topics Concern   Not on file  Social History Narrative   Not on file   Social Determinants of Health   Financial Resource Strain: Low Risk    Difficulty of Paying Living Expenses: Not hard at all  Food Insecurity: No Food Insecurity   Worried About Charity fundraiser in the Last Year: Never true   [place] in the Last Year: Never true  Transportation Needs: No Transportation Needs   Lack of Transportation (Medical): No   Lack of Transportation (Non-Medical): No  Physical Activity: Not on file  Stress: No Stress Concern Present   Feeling of Stress : Not at all  Social Connections: Unknown   Frequency of Communication with Friends and Family: More than three times a week   Frequency of Social Gatherings with Friends and Family: Not on file   Attends Religious Services: Not on file   Active Member of Clubs or Organizations: Not on file   Attends Archivist Meetings: Not on file   Marital Status: Not on file     Family History:  The patient's family history includes Breast cancer in her paternal aunt; CVA in her mother; Cancer in her brother, sister, and sister; Diabetes in her father, sister, sister, and son; Heart disease in her father; Hypertension in her sister; Leukemia in her brother; Lung disease in her brother; Mental illness in her brother.  ROS:   Review of Systems  Constitutional:  Positive for malaise/fatigue. Negative for chills, diaphoresis, fever and weight loss.  HENT:  Negative for congestion.   Eyes:  Negative for discharge and redness.  Respiratory:  Negative for cough, sputum production, shortness of breath and wheezing.   Cardiovascular:  Positive for palpitations. Negative for chest pain, orthopnea, claudication, leg swelling and PND.  Gastrointestinal:  Negative for abdominal pain, heartburn,  nausea and vomiting.  Musculoskeletal:  Negative for falls and myalgias.  Skin:  Negative for rash.  Neurological:  Positive for tremors and weakness. Negative for dizziness, tingling, sensory change, speech change, focal weakness and loss of consciousness.  Endo/Heme/Allergies:  Does not bruise/bleed easily.  Psychiatric/Behavioral:  Negative for substance abuse. The patient is not nervous/anxious.   All other systems reviewed and are negative.   EKGs/Labs/Other Studies Reviewed:    Studies reviewed were summarized above. The additional studies were reviewed today:  2D echo 10/24/2016: - Left ventricle: The cavity size was normal. There was mild    concentric hypertrophy. Systolic function was normal. The    estimated ejection fraction was in the range of 60% to 65%. Wall    motion was normal; there were no regional wall motion    abnormalities. Doppler parameters are consistent  with abnormal    left ventricular relaxation (grade 1 diastolic dysfunction).  - Mitral valve: Calcified annulus. There was mild regurgitation.  - Tricuspid valve: There was moderate regurgitation.  - Pulmonary arteries: Systolic pressure was mildly increased. PA    peak pressure: 38 mm Hg (S). __________  Elwyn Reach patch 01/2021: Unable to be completed   EKG:  EKG is ordered today.  The EKG ordered today demonstrates sinus rhythm with sinus arrhythmia, 74 bpm, baseline artifact, no acute ST-T changes  Recent Labs: 01/28/2021: ALT 9; BUN 25; Creatinine, Ser 1.09; Hemoglobin 12.9; Platelets 185.0; Potassium 4.3; Sodium 140; TSH 1.94  Recent Lipid Panel    Component Value Date/Time   CHOL 227 (H) 11/06/2020 0956   TRIG 114.0 11/06/2020 0956   HDL 74.30 11/06/2020 0956   CHOLHDL 3 11/06/2020 0956   VLDL 22.8 11/06/2020 0956   LDLCALC 130 (H) 11/06/2020 0956    PHYSICAL EXAM:    VS:  BP 120/60 (BP Location: Left Arm, Patient Position: Sitting, Cuff Size: Normal)    Pulse 74    Ht 4\' 11"  (1.499 m)    Wt 96 lb  (43.5 kg)    SpO2 98%    BMI 19.39 kg/m   BMI: Body mass index is 19.39 kg/m.  Physical Exam Vitals reviewed.  Constitutional:      Comments: Elderly appearing  Cardiovascular:     Pulses:          Posterior tibial pulses are 2+ on the right side and 2+ on the left side.  Musculoskeletal:     Right lower leg: No edema.     Left lower leg: No edema.    Wt Readings from Last 3 Encounters:  03/17/21 96 lb (43.5 kg)  02/03/21 95 lb 6 oz (43.3 kg)  01/28/21 95 lb (43.1 kg)     ASSESSMENT & PLAN:   Palpitations: Unchanged over the past couple of months. She was unable to tolerate Zio patch.  Discussed acquiring a [facility] device.  The patient's son will look into this.  He is interested in undergoing a trial of low-dose propanolol 10 mg twice daily as needed palpitations.  With regards to symptoms of possible chest pain associated with palpitations, he would prefer to avoid stress testing, therefore we will proceed with a trial of low-dose propanolol as outlined above.  If symptoms progress, this decision can be reevaluated.  Recent labs unrevealing.  HTN: Blood pressure is well controlled in the office today.  Not currently requiring antihypertensive therapy.  Dementia: On Aricept.    Disposition: F/u with Dr. Rockey Situ in 4 months.   Medication Adjustments/Labs and Tests Ordered: Current medicines are reviewed at length with the patient today.  Concerns regarding medicines are outlined above. Medication changes, Labs and Tests ordered today are summarized above and listed in the Patient Instructions accessible in Encounters.   Signed, Christell Faith, PA-C 03/17/2021 3:39 PM     [facility] [address] [place], [place] [address] [phone]

## 2021-03-17 ENCOUNTER — Other Ambulatory Visit: Payer: Self-pay

## 2021-03-17 ENCOUNTER — Encounter: Payer: Self-pay | Admitting: Physician Assistant

## 2021-03-17 ENCOUNTER — Ambulatory Visit (INDEPENDENT_AMBULATORY_CARE_PROVIDER_SITE_OTHER): Payer: Medicare Other | Admitting: Physician Assistant

## 2021-03-17 VITALS — BP 120/60 | HR 74 | Ht 59.0 in | Wt 96.0 lb

## 2021-03-17 DIAGNOSIS — R002 Palpitations: Secondary | ICD-10-CM | POA: Diagnosis not present

## 2021-03-17 DIAGNOSIS — I10 Essential (primary) hypertension: Secondary | ICD-10-CM | POA: Diagnosis not present

## 2021-03-17 DIAGNOSIS — F0390 Unspecified dementia without behavioral disturbance: Secondary | ICD-10-CM

## 2021-03-17 MED ORDER — PROPRANOLOL HCL 10 MG PO TABS: 10.0000 mg | ORAL_TABLET | Freq: Two times a day (BID) | ORAL | 3 refills | Status: DC | PRN

## 2021-03-17 NOTE — Patient Instructions (Signed)
Medication Instructions:  Your physician has recommended you make the following change in your medication:   START Propranolol 10 mg twice a day as needed for palpitations.   *If you need a refill on your cardiac medications before your next appointment, please call your pharmacy*   Lab Work: None  If you have labs (blood work) drawn today and your tests are completely normal, you will receive your results only by: [facility] (if you have MyChart) OR A paper copy in the mail If you have any lab test that is abnormal or we need to change your treatment, we will call you to review the results.   Testing/Procedures: None   Follow-Up: At [facility], you and your health needs are our priority.  As part of our continuing mission to provide you with exceptional heart care, we have created designated Provider Care Teams.  These Care Teams include your primary Cardiologist (physician) and Advanced Practice Providers (APPs -  Physician Assistants and Nurse Practitioners) who all work together to provide you with the care you need, when you need it.  We recommend signing up for the patient portal called "MyChart".  Sign up information is provided on this After Visit Summary.  MyChart is used to connect with patients for Virtual Visits (Telemedicine).  Patients are able to view lab/test results, encounter notes, upcoming appointments, etc.  Non-urgent messages can be sent to your provider as well.   To learn more about what you can do with MyChart, go to [email or web address].    Your next appointment:   4 month(s)  The format for your next appointment:   In Person  Provider:   Ida Rogue, MD

## 2021-03-18 ENCOUNTER — Telehealth: Payer: Self-pay | Admitting: Physician Assistant

## 2021-03-18 ENCOUNTER — Other Ambulatory Visit: Payer: Self-pay | Admitting: Internal Medicine

## 2021-03-22 NOTE — Telephone Encounter (Signed)
°*  STAT* If patient is at the pharmacy, call can be transferred to refill team.   1. Which medications need to be refilled? (please list name of each medication and dose if known) propranolol 10 mg po BID prn  2. Which pharmacy/location (including street and city if local pharmacy) is medication to be sent to? [facility]   3. Do they need a 30 day or 90 day supply? [place] closed

## 2021-03-23 MED ORDER — PROPRANOLOL HCL 10 MG PO TABS: 10.0000 mg | ORAL_TABLET | Freq: Two times a day (BID) | ORAL | 0 refills | Status: DC | PRN

## 2021-03-23 NOTE — Telephone Encounter (Signed)
Requested Prescriptions   Signed Prescriptions Disp Refills   propranolol (INDERAL) 10 MG tablet 180 tablet 0    Sig: Take 1 tablet (10 mg total) by mouth 2 (two) times daily as needed (As needed for palpitations).    Authorizing Provider: END, CHRISTOPHER    Ordering User: Britt Bottom   Refused Prescriptions Disp Refills   propranolol (INDERAL) 10 MG tablet [Pharmacy Med Name: PROPRANOLOL 10MG  TABLETS] 90 tablet 3    Sig: DO NOT FILL. THIS PRESCRIPTION WAS ON HOLD AT THE PREVIOUS PHARMACY, PLEASE CONTACT THE PRESCRIBER TO OBTAIN A NEW PRESCRIPTION.    Refused By: Anselm Pancoast    Reason for Refusal: Refill not appropriate

## 2021-03-23 NOTE — Addendum Note (Signed)
Addended by: Britt Bottom on: 03/23/2021 07:38 AM   Modules accepted: Orders

## 2021-04-08 ENCOUNTER — Telehealth: Payer: Self-pay | Admitting: Internal Medicine

## 2021-04-08 NOTE — Telephone Encounter (Signed)
Patient is staying agitated more than normal, worrying more, staying more anxious for the last month or more. Offered telephone visit to discuss. Son says that is fine and telephone would actually be more convenient but unable to do 7 am. They do not get up until 9:00-9:30. Could we add them on tomorrow at 4:30? ? ?

## 2021-04-08 NOTE — Telephone Encounter (Signed)
Pt son called in stating he would like to get some medication for her because pt nerves are bad and pt is already on medication for alzheimer. Pt son request to be called ?

## 2021-04-09 NOTE — Telephone Encounter (Signed)
I am unable to do Friday at 4:30, but can work in next week.  Can see if can do Monday at 4:30 and put a hold on the 7:00 am slot on Monday.  ?

## 2021-04-09 NOTE — Telephone Encounter (Signed)
Pt scheduled for Monday PM. Blocked 7 am slot.  ?

## 2021-04-11 ENCOUNTER — Other Ambulatory Visit: Payer: Self-pay

## 2021-04-11 ENCOUNTER — Emergency Department
Admission: EM | Admit: 2021-04-11 | Discharge: 2021-04-11 | Disposition: A | Discharge status: Home / Self Care | Payer: Medicare Other | Attending: Emergency Medicine | Admitting: Emergency Medicine

## 2021-04-11 ENCOUNTER — Emergency Department: Payer: Medicare Other

## 2021-04-11 DIAGNOSIS — R8289 Other abnormal findings on cytological and histological examination of urine: Secondary | ICD-10-CM | POA: Diagnosis not present

## 2021-04-11 DIAGNOSIS — R7989 Other specified abnormal findings of blood chemistry: Secondary | ICD-10-CM | POA: Insufficient documentation

## 2021-04-11 DIAGNOSIS — R002 Palpitations: Secondary | ICD-10-CM | POA: Insufficient documentation

## 2021-04-11 DIAGNOSIS — R079 Chest pain, unspecified: Secondary | ICD-10-CM | POA: Diagnosis not present

## 2021-04-11 DIAGNOSIS — K219 Gastro-esophageal reflux disease without esophagitis: Secondary | ICD-10-CM | POA: Diagnosis not present

## 2021-04-11 DIAGNOSIS — R0789 Other chest pain: Secondary | ICD-10-CM | POA: Diagnosis not present

## 2021-04-11 DIAGNOSIS — I10 Essential (primary) hypertension: Secondary | ICD-10-CM | POA: Diagnosis not present

## 2021-04-11 LAB — BASIC METABOLIC PANEL
Anion gap: 7 (ref 5–15)
BUN: 36 mg/dL — ABNORMAL HIGH (ref 8–23)
CO2: 31 mmol/L (ref 22–32)
Calcium: 9 mg/dL (ref 8.9–10.3)
Chloride: 103 mmol/L (ref 98–111)
Creatinine, Ser: 1.04 mg/dL — ABNORMAL HIGH (ref 0.44–1.00)
GFR, Estimated: 50 mL/min — ABNORMAL LOW (ref >60)
Glucose, Bld: 82 mg/dL (ref 70–99)
Potassium: 4.2 mmol/L (ref 3.5–5.1)
Sodium: 141 mmol/L (ref 135–145)

## 2021-04-11 LAB — URINALYSIS, ROUTINE W REFLEX MICROSCOPIC
Bacteria, UA: NONE SEEN
Bilirubin Urine: NEGATIVE
Glucose, UA: NEGATIVE mg/dL
Hgb urine dipstick: NEGATIVE
Ketones, ur: NEGATIVE mg/dL
Nitrite: NEGATIVE
Protein, ur: NEGATIVE mg/dL
Specific Gravity, Urine: 1.025 (ref 1.005–1.030)
pH: 5 (ref 5.0–8.0)

## 2021-04-11 LAB — TROPONIN I (HIGH SENSITIVITY)
Troponin I (High Sensitivity): 11 ng/L (ref <18)
Troponin I (High Sensitivity): 13 ng/L (ref <18)

## 2021-04-11 LAB — CBC
HCT: 36.4 % (ref 36.0–46.0)
Hemoglobin: 11.6 g/dL — ABNORMAL LOW (ref 12.0–15.0)
MCH: 30.5 pg (ref 26.0–34.0)
MCHC: 31.9 g/dL (ref 30.0–36.0)
MCV: 95.8 fL (ref 80.0–100.0)
Platelets: 207 10*3/uL (ref 150–400)
RBC: 3.8 MIL/uL — ABNORMAL LOW (ref 3.87–5.11)
RDW: 13.4 % (ref 11.5–15.5)
WBC: 5.9 10*3/uL (ref 4.0–10.5)
nRBC: 0 % (ref 0.0–0.2)

## 2021-04-11 NOTE — ED Triage Notes (Signed)
Per family member, pt has been complaining of chest pain off and on for several months, and as of last night started having N/V. Pt denies CP at present but states chest feels funny. Pt alert to self, unsure of date and situation. Pt in NAD at this time.  ?

## 2021-04-11 NOTE — ED Notes (Signed)
See triage note. Pt denies any pain at this time. Granddaughter states that pt was complaining about abdominal and chest pain about 3 hours ago and that she was "getting hot" which is unusual for her. ? ?Pt in NAD at this time. ?

## 2021-04-11 NOTE — Discharge Instructions (Addendum)
-  Review the educational material provided.  Take omeprazole (prilosec) and Pepcid as needed.  Follow-up with your primary care provider if your symptoms persist. ?-Follow-up with your cardiologist in regards to your palpitations, as discussed. ?-Return to the emergency department anytime if you begin to experience any new or worsening symptoms. ?

## 2021-04-11 NOTE — ED Provider Notes (Addendum)
? ?[facility] ?Provider Note ? ? ? Event Date/Time  ? First MD Initiated Contact with Patient 04/11/21 1457   ?  (approximate) ? ? ?History  ? ?Chief Complaint ?Chest Pain ? ? ?HPI ?Rachel Martinez is a 88 y.o. female, history of CVA, hypertension, hypercholesterolemia, renal insufficiency, depression, GERD, anemia, presents to the emergency department for evaluation of chest pain.  Patient states that the pain has been going on for the past few months, described as a burning sensation in the center of her chest that is worse when lying down.  She states that the pain is worse at night.  Additionally, patient endorses intermittent episodes of palpitations that been going on for the past several months as well, describes as a " funny feeling" in her chest.  Denies fever/chills, shortness of breath, abdominal pain, nausea/vomiting, headache, leg swelling, rashes, weakness, urinary symptoms, or lightheadedness/dizziness. ? ?History Limitations: No limitations. ? ?  ? ? ?Physical Exam  ?Triage Vital Signs: ?ED Triage Vitals  ?Enc Vitals Group  ?   BP 04/11/21 1318 135/65  ?   Pulse Rate 04/11/21 1318 75  ?   Resp 04/11/21 1318 18  ?   Temp 04/11/21 1318 98.6 ?F (37 ?C)  ?   Temp Source 04/11/21 1318 Oral  ?   SpO2 04/11/21 1318 97 %  ?   Weight 04/11/21 1319 97 lb (44 kg)  ?   Height 04/11/21 1319 5' (1.524 m)  ?   Head Circumference --   ?   Peak Flow --   ?   Pain Score 04/11/21 1318 0  ?   Pain Loc --   ?   Pain Edu? --   ?   Excl. in [place]? --   ? ? ?Most recent vital signs: ?Vitals:  ? 04/11/21 1500 04/11/21 1630  ?BP: (!) 161/50   ?Pulse: (!) 51 70  ?Resp: 14 13  ?Temp:    ?SpO2: 100% 100%  ? ? ?General: Awake, NAD.  ?Skin: Warm, dry.  ?CV: Good peripheral perfusion.  S1 and S2 present.  No murmurs rubs or gallops. ?Resp: Normal effort.  Lung sounds clear bilaterally in the apices and bases. ?Abd: Soft, non-tender. No distention.  ?Neuro: At baseline. No gross neurological deficits.  ?Other:  Not applicable. ? ?Physical Exam ? ? ? ?ED Results / Procedures / Treatments  ?Labs ?(all labs ordered are listed, but only abnormal results are displayed) ?Labs Reviewed  ?BASIC METABOLIC PANEL - Abnormal; Notable for the following components:  ?    Result Value  ? BUN 36 (*)   ? Creatinine, Ser 1.04 (*)   ? GFR, Estimated 50 (*)   ? All other components within normal limits  ?CBC - Abnormal; Notable for the following components:  ? RBC 3.80 (*)   ? Hemoglobin 11.6 (*)   ? All other components within normal limits  ?URINALYSIS, ROUTINE W REFLEX MICROSCOPIC - Abnormal; Notable for the following components:  ? Color, Urine YELLOW (*)   ? APPearance HAZY (*)   ? Leukocytes,Ua TRACE (*)   ? All other components within normal limits  ?TROPONIN I (HIGH SENSITIVITY)  ?TROPONIN I (HIGH SENSITIVITY)  ? ? ? ?EKG ?Sinus rhythm, rate of 62, no ST segment changes, no AV blocks, no axis deviations. ? ? ?RADIOLOGY ? ?ED Provider Interpretation: I personally reviewed and interpreted this x-ray, no active cardiopulmonary disease. ? ?DG Chest 2 View ? ?Result Date: 04/11/2021 ?CLINICAL DATA:  Chest pain.  EXAM: CHEST - 2 VIEW COMPARISON:  10/22/2016 FINDINGS: Lungs are hyperexpanded. The lungs are clear without focal pneumonia, edema, pneumothorax or pleural effusion. The cardiopericardial silhouette is within normal limits for size. Bones are diffusely demineralized. IMPRESSION: No active cardiopulmonary disease. Electronically Signed   By: Misty Stanley M.D.   On: 04/11/2021 13:54   ? ?PROCEDURES: ? ?Critical Care performed: None. ? ?Procedures ? ? ? ?MEDICATIONS ORDERED IN ED: ?Medications - No data to display ? ? ?IMPRESSION / MDM / ASSESSMENT AND PLAN / ED COURSE  ?I reviewed the triage vital signs and the nursing notes. ?             ?               ? ?Differential diagnosis includes, but is not limited to, ACS, GERD, myocarditis/peritonitis, PE, pneumonia, cardiac tamponade, anxiety, esophagitis. ? ?ED Course ?Patient appears  well.  Vital signs within normal limits.  NAD. ? ?Patient was placed on cardiac monitor for evaluation of arrhythmias. ? ?CBC shows no leukocytosis or anemia.  BMP shows no electrolyte abnormalities.  Creatinine 1.04, consistent with previous numbers ? ?Initial troponin 11.  Second troponin 13.  EKG unremarkable.  Unlikely ACS or myocarditis/pericarditis. ? ?Urinalysis shows trace leukocytes, otherwise no evidence of urinary tract infection. ? ?Assessment/Plan ?Presentation consistent with GERD, particular given that she describes sensation as burning and that the pain is worse when laying down and at night.  Low suspicion for ACS given unremarkable lab and unremarkable findings on EKG.  The duration of the symptoms have been over the past several months, unlikely any acute pathology going on now.  She is currently asymptomatic.  Advised her to treat over-the-counter with omeprazole and Pepcid as needed.  Advised patient that his symptoms continue despite conservative management, to follow-up with her primary care provider.  Patient expressed understanding and agreed with the plan. ? ?In regards to her endorsement of palpitations, she is currently being followed by cardiology for this.  Patient has history of sinus arrhythmia with ectopic atrial beats.  She was last evaluated by cardiology on 02/03/2021.  They discussed the possibility of placing a Holter monitor for further evaluation.  Advised patient to follow-up with her cardiologist.  Her daughter was in the room, who expressed understanding and agreement with the plan. ? ?Patient was provided with anticipatory guidance, return precautions, and educational material. Encouraged the patient to return to the emergency department at any time if they begin to experience any new or worsening symptoms.  ? ?  ? ? ?FINAL CLINICAL IMPRESSION(S) / ED DIAGNOSES  ? ?Final diagnoses:  ?Chest pain, unspecified type  ?Gastroesophageal reflux disease without esophagitis   ? ? ? ?Rx / DC Orders  ? ?ED Discharge Orders   ? ? None  ? ?  ? ? ? ?Note:  This document was prepared using Dragon voice recognition software and may include unintentional dictation errors. ?  ?Teodoro Spray, [place] ?04/11/21 1707 ? ?  ?Teodoro Spray, [place] ?04/11/21 1710 ? ?  ?Rada Hay, MD ?04/11/21 1810 ? ?

## 2021-04-11 NOTE — ED Notes (Signed)
Pt placed on card monitor; granddaughter at bedside. ?

## 2021-04-11 NOTE — ED Notes (Signed)
Pt with hx of UTIs per family. Pt denies burning with urination.  ?

## 2021-04-12 ENCOUNTER — Ambulatory Visit (INDEPENDENT_AMBULATORY_CARE_PROVIDER_SITE_OTHER): Payer: Medicare Other | Admitting: Internal Medicine

## 2021-04-12 DIAGNOSIS — G319 Degenerative disease of nervous system, unspecified: Secondary | ICD-10-CM | POA: Diagnosis not present

## 2021-04-12 DIAGNOSIS — F419 Anxiety disorder, unspecified: Secondary | ICD-10-CM | POA: Diagnosis not present

## 2021-04-12 DIAGNOSIS — R413 Other amnesia: Secondary | ICD-10-CM | POA: Diagnosis not present

## 2021-04-12 DIAGNOSIS — R079 Chest pain, unspecified: Secondary | ICD-10-CM

## 2021-04-12 MED ORDER — SERTRALINE HCL 25 MG PO TABS: 25.0000 mg | ORAL_TABLET | Freq: Every day | ORAL | 1 refills | Status: DC

## 2021-04-12 NOTE — Progress Notes (Signed)
Patient ID: Rachel Martinez, female   DOB: 11/19/1927, 88 y.o.   MRN: [id number] ? ? ?Virtual Visit via telephone Note ? ?This visit type was conducted due to national recommendations for restrictions regarding the COVID-19 pandemic (e.g. social distancing).  This format is felt to be most appropriate for this patient at this time.  All issues noted in this document were discussed and addressed.  No physical exam was performed (except for noted visual exam findings with Video Visits).  ? ?I connected with Rachel Martinez by telephone and verified that I am speaking with the correct person using two identifiers. ?Location patient: home ?Location provider: work  ?Persons participating in the virtual visit: patient, provider and pts son Rachel Martinez) ? ?The limitations, risks, security and privacy concerns of performing an evaluation and management service by telephone and the availability of in person appointments have been discussed.  It has also been discussed with the patient that there may be a patient responsible charge related to this service. The patient expressed understanding and agreed to proceed. ? ? ?Reason for visit: work in appt.  ? ?HPI: ?Work in to discuss some increased anxiety and agitation.  Son reports that [name] gets upset easier.  States things that occur that are out of her normal routine get her upset and more agitated.  States when the grandchildren come to visit - increased stress/anxiety.  Dogs visit - same thing.  No significant depression.  More anxiety. Was on zoloft previously.  Tolerated. Discussed restarting.  She is eating.  No nausea or vomiting. States has a good appetite.  Was evaluated in ER 04/11/21 for burning in her chest. W/up unrevealing.  Was instructed to start pepcid/prilosec.  Has started.  Denies any problems now.   ? ? ?ROS: See pertinent positives and negatives per HPI. ? ?Past Medical History:  ?Diagnosis Date  ? Anemia   ? Chicken pox   ? CVA (cerebral vascular accident)  ([place]) 8/06  ? Depression   ? GERD (gastroesophageal reflux disease)   ? Heart palpitations   ? Hypercholesteremia   ? Hypertension   ? Stroke [facility])   ? ? ?Past Surgical History:  ?Procedure Laterality Date  ? APPENDECTOMY    ? ? ?Family History  ?Problem Relation Age of Onset  ? CVA Mother   ? Diabetes Father   ? Heart disease Father   ? Cancer Sister   ?     breast   ? Diabetes Sister   ? Cancer Brother   ?     stomach  ? Leukemia Brother   ? Lung disease Brother   ? Hypertension Sister   ? Cancer Sister   ?     stomach  ? Diabetes Sister   ? Mental illness Brother   ?     suicide  ? Breast cancer Paternal Aunt   ? Diabetes Son   ? ? ?SOCIAL HX: reviewed.  ? ? ?Current Outpatient Medications:  ?  sertraline (ZOLOFT) 25 MG tablet, Take 1 tablet (25 mg total) by mouth daily., Disp: 30 tablet, Rfl: 1 ?  ASPIRIN ADULT LOW DOSE PO, Take 1 tablet by mouth daily., Disp: , Rfl:  ?  calcium carbonate (TUMS - DOSED IN MG ELEMENTAL CALCIUM) 500 MG chewable tablet, Chew 1 tablet by mouth 3 (three) times daily as needed. , Disp: , Rfl:  ?  Cyanocobalamin (VITAMIN B 12 PO), Take 100 mcg by mouth daily., Disp: , Rfl:  ?  donepezil (ARICEPT) 5  MG tablet, DO NOT FILL. THIS PRESCRIPTION WAS ON HOLD AT THE PREVIOUS PHARMACY, PLEASE CONTACT THE PRESCRIBER TO OBTAIN A NEW PRESCRIPTION., Disp: 90 tablet, Rfl: 1 ?  propranolol (INDERAL) 10 MG tablet, Take 1 tablet (10 mg total) by mouth 2 (two) times daily as needed (As needed for palpitations)., Disp: 180 tablet, Rfl: 0 ? ?EXAM: ? ?GENERAL: alert. Sounds to be in no acute distress.  ? ?PSYCH/NEURO: pleasant and cooperative, no obvious depression or anxiety, speech and thought processing grossly intact ? ?ASSESSMENT AND PLAN: ? ?Discussed the following assessment and plan: ? ?Problem List Items Addressed This Visit   ? ? Anxiety  ?  Increased stress/anxiety as outlined.  Discussed.  Also discussed treatment.  Will start zoloft.  Follow.  Schedule soon f/u to reassess.  Call with update.   ?  ?  ? Relevant Medications  ? sertraline (ZOLOFT) 25 MG tablet  ? Cerebral atrophy ([place])  ?  Noted on previous CT scan.  On aricept.  Follow.  ?  ?  ? Chest pain in adult  ?  Recently evaluated in ER for burning sensation in her chest.  W/up unrevealing.  Felt to be related to GI origin.  Recommended starting pepcid/prilosec.  Has started.  Continue pepcid '20mg'$  before breakfast.  Follow. Saw cardiology recently.  zio monitor - unable to complete.  Per cardiology, with regards to symptoms of possible chest pain associated with palpitations, he would prefer to avoid stress testing  It was decided to proceed with a trial of low-dose propanolol.  ?  ?  ? Memory change  ?  Continue aricept.  ?  ?  ? ? ?Return in about 6 weeks (around 05/24/2021) for 6-7 weeks f/u (8mn). ?  ?I discussed the assessment and treatment plan with the patient. The patient was provided an opportunity to ask questions and all were answered. The patient agreed with the plan and demonstrated an understanding of the instructions. ?  ?The patient was advised to call back or seek an in-person evaluation if the symptoms worsen or if the condition fails to improve as anticipated. ? ?I provided 23 minutes of non-face-to-face time during this encounter. ? ? ?CEinar Pheasant MD   ?

## 2021-04-13 ENCOUNTER — Encounter: Payer: Self-pay | Admitting: Emergency Medicine

## 2021-04-13 ENCOUNTER — Other Ambulatory Visit: Payer: Self-pay

## 2021-04-13 ENCOUNTER — Emergency Department: Payer: Medicare Other

## 2021-04-13 ENCOUNTER — Emergency Department
Admission: EM | Admit: 2021-04-13 | Discharge: 2021-04-13 | Disposition: A | Discharge status: Home / Self Care | Payer: Medicare Other | Attending: Emergency Medicine | Admitting: Emergency Medicine

## 2021-04-13 ENCOUNTER — Encounter: Payer: Self-pay | Admitting: Internal Medicine

## 2021-04-13 DIAGNOSIS — F0390 Unspecified dementia without behavioral disturbance: Secondary | ICD-10-CM | POA: Insufficient documentation

## 2021-04-13 DIAGNOSIS — I2510 Atherosclerotic heart disease of native coronary artery without angina pectoris: Secondary | ICD-10-CM | POA: Diagnosis not present

## 2021-04-13 DIAGNOSIS — I10 Essential (primary) hypertension: Secondary | ICD-10-CM | POA: Diagnosis not present

## 2021-04-13 DIAGNOSIS — R0789 Other chest pain: Secondary | ICD-10-CM | POA: Diagnosis not present

## 2021-04-13 DIAGNOSIS — F419 Anxiety disorder, unspecified: Secondary | ICD-10-CM | POA: Insufficient documentation

## 2021-04-13 DIAGNOSIS — R079 Chest pain, unspecified: Secondary | ICD-10-CM | POA: Diagnosis not present

## 2021-04-13 DIAGNOSIS — K219 Gastro-esophageal reflux disease without esophagitis: Secondary | ICD-10-CM | POA: Diagnosis not present

## 2021-04-13 LAB — COMPREHENSIVE METABOLIC PANEL
ALT: 12 U/L (ref 0–44)
AST: 25 U/L (ref 15–41)
Albumin: 3.7 g/dL (ref 3.5–5.0)
Alkaline Phosphatase: 92 U/L (ref 38–126)
Anion gap: 5 (ref 5–15)
BUN: 29 mg/dL — ABNORMAL HIGH (ref 8–23)
CO2: 30 mmol/L (ref 22–32)
Calcium: 9.6 mg/dL (ref 8.9–10.3)
Chloride: 104 mmol/L (ref 98–111)
Creatinine, Ser: 1.13 mg/dL — ABNORMAL HIGH (ref 0.44–1.00)
GFR, Estimated: 46 mL/min — ABNORMAL LOW (ref >60)
Glucose, Bld: 100 mg/dL — ABNORMAL HIGH (ref 70–99)
Potassium: 4.3 mmol/L (ref 3.5–5.1)
Sodium: 139 mmol/L (ref 135–145)
Total Bilirubin: 0.6 mg/dL (ref 0.3–1.2)
Total Protein: 6.8 g/dL (ref 6.5–8.1)

## 2021-04-13 LAB — CBC WITH DIFFERENTIAL/PLATELET
Abs Immature Granulocytes: 0.01 10*3/uL (ref 0.00–0.07)
Basophils Absolute: 0.1 10*3/uL (ref 0.0–0.1)
Basophils Relative: 1 %
Eosinophils Absolute: 0.2 10*3/uL (ref 0.0–0.5)
Eosinophils Relative: 2 %
HCT: 38.1 % (ref 36.0–46.0)
Hemoglobin: 12 g/dL (ref 12.0–15.0)
Immature Granulocytes: 0 %
Lymphocytes Relative: 26 %
Lymphs Abs: 1.7 10*3/uL (ref 0.7–4.0)
MCH: 30.5 pg (ref 26.0–34.0)
MCHC: 31.5 g/dL (ref 30.0–36.0)
MCV: 96.9 fL (ref 80.0–100.0)
Monocytes Absolute: 0.5 10*3/uL (ref 0.1–1.0)
Monocytes Relative: 8 %
Neutro Abs: 4.1 10*3/uL (ref 1.7–7.7)
Neutrophils Relative %: 63 %
Platelets: 180 10*3/uL (ref 150–400)
RBC: 3.93 MIL/uL (ref 3.87–5.11)
RDW: 13.3 % (ref 11.5–15.5)
WBC: 6.5 10*3/uL (ref 4.0–10.5)
nRBC: 0 % (ref 0.0–0.2)

## 2021-04-13 LAB — BRAIN NATRIURETIC PEPTIDE: B Natriuretic Peptide: 119.4 pg/mL — ABNORMAL HIGH (ref 0.0–100.0)

## 2021-04-13 LAB — TROPONIN I (HIGH SENSITIVITY): Troponin I (High Sensitivity): 13 ng/L (ref <18)

## 2021-04-13 MED ORDER — AMLODIPINE BESYLATE 5 MG PO TABS
5.0000 mg | ORAL_TABLET | Freq: Once | ORAL | Status: AC
  Administered 2021-04-13: 5 mg via ORAL
  Filled 2021-04-13: qty 1

## 2021-04-13 MED ORDER — AMLODIPINE BESYLATE 5 MG PO TABS: 5.0000 mg | ORAL_TABLET | Freq: Every day | ORAL | 0 refills | Status: DC

## 2021-04-13 NOTE — Assessment & Plan Note (Signed)
Noted on previous CT scan.  On aricept.  Follow.  

## 2021-04-13 NOTE — Assessment & Plan Note (Signed)
Increased stress/anxiety as outlined.  Discussed.  Also discussed treatment.  Will start zoloft.  Follow.  Schedule soon f/u to reassess.  Call with update.  ?

## 2021-04-13 NOTE — Assessment & Plan Note (Signed)
Recently evaluated in ER for burning sensation in her chest.  W/up unrevealing.  Felt to be related to GI origin.  Recommended starting pepcid/prilosec.  Has started.  Continue pepcid '20mg'$  before breakfast.  Follow. Saw cardiology recently.  zio monitor - unable to complete.  Per cardiology, with regards to symptoms of possible chest pain associated with palpitations, he would prefer to avoid stress testing  It was decided to proceed with a trial of low-dose propanolol.  ?

## 2021-04-13 NOTE — ED Provider Notes (Signed)
? ?[facility] ?Provider Note ? ? Event Date/Time  ? First MD Initiated Contact with Patient 04/13/21 2138   ?  (approximate) ?History  ?Chest Pain ? ?HPI ?Rachel Martinez is a 88 y.o. female who presents via EMS for chest pain and hypertension.  Patient was seen 2 days prior to this for similar symptoms and discharged after negative work-up however patient was not started on any blood pressure medicines at that time.  Patient's chest pain has resolved during this evaluation.  Patient does have history of dementia and history obtained from family. ?Physical Exam  ?Triage Vital Signs: ?ED Triage Vitals  ?Enc Vitals Group  ?   BP 04/13/21 2151 (!) 193/73  ?   Pulse Rate 04/13/21 2151 (!) 58  ?   Resp 04/13/21 2151 17  ?   Temp 04/13/21 2151 97.9 ?F (36.6 ?C)  ?   Temp Source 04/13/21 2151 Oral  ?   SpO2 04/13/21 2151 99 %  ?   Weight 04/13/21 2145 103 lb 6.4 oz (46.9 kg)  ?   Height 04/13/21 2145 5' (1.524 m)  ?   Head Circumference --   ?   Peak Flow --   ?   Pain Score --   ?   Pain Loc --   ?   Pain Edu? --   ?   Excl. in [place]? --   ? ?Most recent vital signs: ?Vitals:  ? 04/13/21 2151 04/13/21 2230  ?BP: (!) 193/73 (!) 198/66  ?Pulse: (!) 58 (!) 53  ?Resp: 17 15  ?Temp: 97.9 ?F (36.6 ?C)   ?SpO2: 99% 100%  ? ?General: Awake, oriented x0. ?CV:  Good peripheral perfusion.  ?Resp:  Normal effort.  ?Abd:  No distention.  ?Other:  Elderly Caucasian female laying in bed in no distress ?ED Results / Procedures / Treatments  ?Labs ?(all labs ordered are listed, but only abnormal results are displayed) ?Labs Reviewed  ?COMPREHENSIVE METABOLIC PANEL - Abnormal; Notable for the following components:  ?    Result Value  ? Glucose, Bld 100 (*)   ? BUN 29 (*)   ? Creatinine, Ser 1.13 (*)   ? GFR, Estimated 46 (*)   ? All other components within normal limits  ?BRAIN NATRIURETIC PEPTIDE - Abnormal; Notable for the following components:  ? B Natriuretic Peptide 119.4 (*)   ? All other components within normal  limits  ?CBC WITH DIFFERENTIAL/PLATELET  ?TROPONIN I (HIGH SENSITIVITY)  ? ?EKG ?ED ECG REPORT ?I, Naaman Plummer, the attending physician, personally viewed and interpreted this ECG. ?Date: 04/13/2021 ?EKG Time: 2151 ?Rate: 53 ?Rhythm: Bradycardic sinus rhythm ?QRS Axis: normal ?Intervals: normal ?ST/T Wave abnormalities: normal ?Narrative Interpretation: Bradycardic sinus rhythm.  No evidence of acute ischemia ?RADIOLOGY ?ED MD interpretation: One-view portable chest x-ray interpreted by me shows no evidence of acute abnormalities including no pneumonia, pneumothorax, or widened mediastinum ?-Agree with radiology assessment ?Official radiology report(s): ?DG Chest Port 1 View ? ?Result Date: 04/13/2021 ?CLINICAL DATA:  Chest pain EXAM: PORTABLE CHEST 1 VIEW COMPARISON:  04/11/2021 FINDINGS: Lungs are clear.  No pleural effusion or pneumothorax. The heart is normal in size.  Thoracic aortic atherosclerosis. IMPRESSION: No evidence of acute cardiopulmonary disease. Electronically Signed   By: Julian Hy M.D.   On: 04/13/2021 22:02   ?PROCEDURES: ?Critical Care performed: No ?.1-3 Lead EKG Interpretation ?Performed by: Naaman Plummer, MD ?Authorized by: Naaman Plummer, MD  ? ?  Interpretation: normal   ?  ECG rate:  55 ?  ECG rate assessment: normal   ?  Rhythm: sinus rhythm   ?  Ectopy: none   ?  Conduction: normal   ?MEDICATIONS ORDERED IN ED: ?Medications  ?amLODipine (NORVASC) tablet 5 mg (5 mg Oral Given 04/13/21 2257)  ? ?IMPRESSION / MDM / ASSESSMENT AND PLAN / ED COURSE  ?I reviewed the triage vital signs and the nursing notes. ?             ?               ?The patient is on the cardiac monitor to evaluate for evidence of arrhythmia and/or significant heart rate changes. ?Presents to the emergency department complaining of high blood pressure. ?Patient is otherwise asymptomatic without confusion, chest pain, hematuria, or SOB. ?DDx: CV, AMI, heart failure, renal infarction or failure or other end  organ damage. ?[place] radiologic evaluation shows no red flag symptomatology at this time including negative troponin, BNP 119, nonischemic EKG, and clear chest x-ray ?Disposition: Discussed with patient their elevated blood pressure and need for close outpatient management of their hypertension. Will provide a prescription for amlodipine '5mg'$  PO daily and arrange for the patient to follow up in a primary care clinic ? ?  ?FINAL CLINICAL IMPRESSION(S) / ED DIAGNOSES  ? ?Final diagnoses:  ?Primary hypertension  ? ?Rx / DC Orders  ? ?ED Discharge Orders   ? ?      Ordered  ?  amLODipine (NORVASC) 5 MG tablet  Daily       ? 04/13/21 2259  ? ?  ?  ? ?  ? ?Note:  This document was prepared using Dragon voice recognition software and may include unintentional dictation errors. ?  ?Naaman Plummer, MD ?04/13/21 2312 ? ?

## 2021-04-13 NOTE — ED Triage Notes (Signed)
Pt arrived via [facility] from home with reports of chest pain, pt was seen 2 days ago for CP, per EMS pt reports the CP has resolved.  Pt has hx of dementia. EMS gave '324mg'$  ASA enroute. ? ? ?Per EMS family reports the pt stood up and started to c/o chest pain. ? ?Per EMS family reports pt has been having increased anxiety and BP has been elevated with no hx of HTN. ? ?Pt recently started on Zoloft. ? ?Pt lives with son and daughter in law. ?

## 2021-04-13 NOTE — Assessment & Plan Note (Signed)
Continue aricept 

## 2021-04-13 NOTE — ED Notes (Signed)
Pt had chest pain on the initial call out for EMS from home. When arriving to the ER the pt stated she had no chest pain at this time. Pt A&Ox2 hx dementia. Pt family member in room with pt.  ?

## 2021-04-13 NOTE — ED Notes (Signed)
Pt urine sample sent down ?

## 2021-04-15 ENCOUNTER — Telehealth: Payer: Self-pay | Admitting: Internal Medicine

## 2021-04-15 NOTE — Telephone Encounter (Signed)
Pt daughter n law called stating pt mental status has change over the last two weeks but it took a sharp change this week ?

## 2021-04-15 NOTE — Telephone Encounter (Signed)
Per discussion with CMA - offered earlier appt. Does not feel needs since doing better.  Will call if problems.  ?

## 2021-04-15 NOTE — Telephone Encounter (Signed)
S/w pt son -  ?Stated over the last few weeks, pt has become increasingly confused, and b/p has been creeping up. ? ?Yesterday, pt was complaining of heaviness and pain in chest.  ?Blood pressure was 200. ?Pt was brought to ER. ? ?No signs of heart attack or stroke. ?Was put on b/p med. ?Today's b/p was 135/60. ?Patient still having some confusion, but pt son states seems to be a little better. ? ?Scheduled pt for 3/28 at 12pm for hospital f/u and mental status discussion. ? ?Pt son advised if sx worsen, please call immediatley. ?

## 2021-04-15 NOTE — Telephone Encounter (Signed)
Pt son called in stating pt has gotten worse over the last couple of days since her BP was over 200 at hospital. Pt is aching all over, has a headache and is more confused each day. Pt has a history of stroke and pt also has dementia. Pt son is concerned because he may have think she had a stroke. Sent to access nurse ?

## 2021-04-15 NOTE — Telephone Encounter (Signed)
Access nurse called back stating pt son said that pt has been to the ER twice and no one checked her for a stroke but they checked her urine and she does not have an infection. Pt son said her vitals are fine and he did not know about oxygen and no lung problems. Pt son said her dementia is worse and she does not know who she is and is calling her son her husband. Pt does live with family and it might be a new stage of alzheimer. Pt has appt on wednesday ? ? ? ? ?  ?

## 2021-04-16 DIAGNOSIS — Z20822 Contact with and (suspected) exposure to covid-19: Secondary | ICD-10-CM | POA: Diagnosis not present

## 2021-04-16 NOTE — Telephone Encounter (Signed)
Called son he called last night and access nurse called office this morning want to to know if patient could be seen sooner appointment since she is having so much confusion at night , son says she thinks he is his dad. I did advise PCP has nothing today he really wants follow up with PCP will wait to 04/20/21 if he has too . I advised any changes in symptoms to return to ED, patient son did report all vitals good was not sure on 02 cannot check pulse and BP good.  ?

## 2021-04-16 NOTE — Telephone Encounter (Signed)
Agree with evaluation if worsening problems.   ?

## 2021-04-21 ENCOUNTER — Other Ambulatory Visit: Payer: Self-pay

## 2021-04-21 ENCOUNTER — Ambulatory Visit (INDEPENDENT_AMBULATORY_CARE_PROVIDER_SITE_OTHER): Payer: Medicare Other | Admitting: Internal Medicine

## 2021-04-21 DIAGNOSIS — E7800 Pure hypercholesterolemia, unspecified: Secondary | ICD-10-CM | POA: Diagnosis not present

## 2021-04-21 DIAGNOSIS — R079 Chest pain, unspecified: Secondary | ICD-10-CM | POA: Diagnosis not present

## 2021-04-21 DIAGNOSIS — R413 Other amnesia: Secondary | ICD-10-CM | POA: Diagnosis not present

## 2021-04-21 DIAGNOSIS — D509 Iron deficiency anemia, unspecified: Secondary | ICD-10-CM | POA: Diagnosis not present

## 2021-04-21 DIAGNOSIS — F439 Reaction to severe stress, unspecified: Secondary | ICD-10-CM

## 2021-04-21 DIAGNOSIS — G319 Degenerative disease of nervous system, unspecified: Secondary | ICD-10-CM | POA: Diagnosis not present

## 2021-04-21 DIAGNOSIS — I10 Essential (primary) hypertension: Secondary | ICD-10-CM | POA: Diagnosis not present

## 2021-04-21 DIAGNOSIS — Z20822 Contact with and (suspected) exposure to covid-19: Secondary | ICD-10-CM | POA: Diagnosis not present

## 2021-04-21 DIAGNOSIS — Z8673 Personal history of transient ischemic attack (TIA), and cerebral infarction without residual deficits: Secondary | ICD-10-CM | POA: Diagnosis not present

## 2021-04-21 MED ORDER — SERTRALINE HCL 50 MG PO TABS: 50.0000 mg | ORAL_TABLET | Freq: Every day | ORAL | 2 refills | Status: DC

## 2021-04-21 MED ORDER — AMLODIPINE BESYLATE 5 MG PO TABS: 5.0000 mg | ORAL_TABLET | Freq: Every day | ORAL | 2 refills | Status: DC

## 2021-04-21 NOTE — Progress Notes (Signed)
Patient ID: Rachel Martinez, female   DOB: 1927-12-25, 88 y.o.   MRN: [id number] ? ? ?Subjective:  ? ? Patient ID: Rachel Martinez, female    DOB: Feb 20, 1927, 88 y.o.   MRN: [id number] ? ?This visit occurred during the SARS-CoV-2 public health emergency.  Safety protocols were in place, including screening questions prior to the visit, additional usage of staff PPE, and extensive cleaning of exam room while observing appropriate contact time as indicated for disinfecting solutions.  ? ?Patient here for ER follow up.  ? ?Chief Complaint  ?Patient presents with  ? Altered Mental Status  ? Burning in chest  ? .  ? ?HPI ?Was evaluated 04/13/21 in ER for chest pain and hypertension.  Negative troponin, BNP 119, nonischemic EKG and clear cxr. Was started on amlodipine.  Comes in today with her son.  History obtained from both of them.  No further chest pain.  No headache or dizziness reported.  No abdominal pain or bowel change.  Some increased confusion at times.  Repeats self.  Discussed formal neurological w/up.  Seeing cardiology.  Note reviewed.  Holding on further cardiac w/up.   ? ? ?Past Medical History:  ?Diagnosis Date  ? Anemia   ? Chicken pox   ? CVA (cerebral vascular accident) ([place]) 8/06  ? Depression   ? GERD (gastroesophageal reflux disease)   ? Heart palpitations   ? Hypercholesteremia   ? Hypertension   ? Stroke [facility])   ? ?Past Surgical History:  ?Procedure Laterality Date  ? APPENDECTOMY    ? ?Family History  ?Problem Relation Age of Onset  ? CVA Mother   ? Diabetes Father   ? Heart disease Father   ? Cancer Sister   ?     breast   ? Diabetes Sister   ? Cancer Brother   ?     stomach  ? Leukemia Brother   ? Lung disease Brother   ? Hypertension Sister   ? Cancer Sister   ?     stomach  ? Diabetes Sister   ? Mental illness Brother   ?     suicide  ? Breast cancer Paternal Aunt   ? Diabetes Son   ? ?Social History  ? ?Socioeconomic History  ? Marital status: Widowed  ?  Spouse name: Not on file  ? Number of  children: Not on file  ? Years of education: Not on file  ? Highest education level: Not on file  ?Occupational History  ? Not on file  ?Tobacco Use  ? Smoking status: Former  ?  Packs/day: 0.25  ?  Years: 30.00  ?  Pack years: 7.50  ?  Types: Cigarettes  ?  Quit date: 09/13/2003  ?  Years since quitting: 17.6  ? Smokeless tobacco: Never  ?Vaping Use  ? Vaping Use: Never used  ?Substance and Sexual Activity  ? Alcohol use: No  ?  Alcohol/week: 0.0 standard drinks  ? Drug use: No  ? Sexual activity: Never  ?Other Topics Concern  ? Not on file  ?Social History Narrative  ? Not on file  ? ?Social Determinants of Health  ? ?Financial Resource Strain: Low Risk   ? Difficulty of Paying Living Expenses: Not hard at all  ?Food Insecurity: No Food Insecurity  ? Worried About Charity fundraiser in the Last Year: Never true  ? Ran Out of Food in the Last Year: Never true  ?Transportation Needs: No Transportation Needs  ?  Lack of Transportation (Medical): No  ? Lack of Transportation (Non-Medical): No  ?Physical Activity: Not on file  ?Stress: No Stress Concern Present  ? Feeling of Stress : Not at all  ?Social Connections: Unknown  ? Frequency of Communication with Friends and Family: More than three times a week  ? Frequency of Social Gatherings with Friends and Family: Not on file  ? Attends Religious Services: Not on file  ? Active Member of Clubs or Organizations: Not on file  ? Attends Archivist Meetings: Not on file  ? Marital Status: Not on file  ? ? ? ?Review of Systems  ?Constitutional:  Negative for appetite change and unexpected weight change.  ?HENT:  Negative for congestion and sinus pressure.   ?Respiratory:  Negative for cough, chest tightness and shortness of breath.   ?Cardiovascular:  Negative for chest pain, palpitations and leg swelling.  ?Gastrointestinal:  Negative for abdominal pain, diarrhea, nausea and vomiting.  ?Genitourinary:  Negative for difficulty urinating and dysuria.   ?Musculoskeletal:  Negative for joint swelling and myalgias.  ?Skin:  Negative for color change and rash.  ?Neurological:  Negative for dizziness, light-headedness and headaches.  ?Psychiatric/Behavioral:  Negative for agitation and dysphoric mood.   ? ?   ?Objective:  ?  ? ?BP 134/76   Pulse 70   Temp 97.9 ?F (36.6 ?C)   Resp 16   Ht 5' (1.524 m)   Wt 96 lb 6.4 oz (43.7 kg)   SpO2 97%   BMI 18.83 kg/m?  ?Wt Readings from Last 3 Encounters:  ?04/21/21 96 lb 6.4 oz (43.7 kg)  ?04/13/21 103 lb 6.4 oz (46.9 kg)  ?04/11/21 97 lb (44 kg)  ? ? ?Physical Exam ?Vitals reviewed.  ?Constitutional:   ?   General: She is not in acute distress. ?   Appearance: Normal appearance.  ?HENT:  ?   Head: Normocephalic and atraumatic.  ?   Right Ear: External ear normal.  ?   Left Ear: External ear normal.  ?Eyes:  ?   General: No scleral icterus.    ?   Right eye: No discharge.     ?   Left eye: No discharge.  ?   Conjunctiva/sclera: Conjunctivae normal.  ?Neck:  ?   Thyroid: No thyromegaly.  ?Cardiovascular:  ?   Rate and Rhythm: Normal rate and regular rhythm.  ?Pulmonary:  ?   Effort: No respiratory distress.  ?   Breath sounds: Normal breath sounds. No wheezing.  ?Abdominal:  ?   General: Bowel sounds are normal.  ?   Palpations: Abdomen is soft.  ?   Tenderness: There is no abdominal tenderness.  ?Musculoskeletal:     ?   General: No swelling or tenderness.  ?   Cervical back: Neck supple. No tenderness.  ?Lymphadenopathy:  ?   Cervical: No cervical adenopathy.  ?Skin: ?   Findings: No erythema or rash.  ?Neurological:  ?   Mental Status: She is alert.  ?Psychiatric:     ?   Mood and Affect: Mood normal.     ?   Behavior: Behavior normal.  ? ? ? ?Outpatient Encounter Medications as of 04/21/2021  ?Medication Sig  ? sertraline (ZOLOFT) 50 MG tablet Take 1 tablet (50 mg total) by mouth daily.  ? amLODipine (NORVASC) 5 MG tablet Take 1 tablet (5 mg total) by mouth daily.  ? ASPIRIN ADULT LOW DOSE PO Take 1 tablet by mouth  daily.  ? calcium carbonate (TUMS -  DOSED IN MG ELEMENTAL CALCIUM) 500 MG chewable tablet Chew 1 tablet by mouth 3 (three) times daily as needed.   ? Cyanocobalamin (VITAMIN B 12 PO) Take 100 mcg by mouth daily.  ? donepezil (ARICEPT) 5 MG tablet DO NOT FILL. THIS PRESCRIPTION WAS ON HOLD AT THE PREVIOUS PHARMACY, PLEASE CONTACT THE PRESCRIBER TO OBTAIN A NEW PRESCRIPTION.  ? propranolol (INDERAL) 10 MG tablet Take 1 tablet (10 mg total) by mouth 2 (two) times daily as needed (As needed for palpitations).  ? [DISCONTINUED] amLODipine (NORVASC) 5 MG tablet Take 1 tablet (5 mg total) by mouth daily.  ? [DISCONTINUED] sertraline (ZOLOFT) 25 MG tablet Take 1 tablet (25 mg total) by mouth daily.  ? ?No facility-administered encounter medications on file as of 04/21/2021.  ?  ? ?Lab Results  ?Component Value Date  ? WBC 6.5 04/13/2021  ? HGB 12.0 04/13/2021  ? HCT 38.1 04/13/2021  ? PLT 180 04/13/2021  ? GLUCOSE 100 (H) 04/13/2021  ? CHOL 227 (H) 11/06/2020  ? TRIG 114.0 11/06/2020  ? HDL 74.30 11/06/2020  ? LDLCALC 130 (H) 11/06/2020  ? ALT 12 04/13/2021  ? AST 25 04/13/2021  ? NA 139 04/13/2021  ? K 4.3 04/13/2021  ? CL 104 04/13/2021  ? CREATININE 1.13 (H) 04/13/2021  ? BUN 29 (H) 04/13/2021  ? CO2 30 04/13/2021  ? TSH 1.94 01/28/2021  ? HGBA1C 5.4 10/29/2018  ? ? ?DG Chest Port 1 View ? ?Result Date: 04/13/2021 ?CLINICAL DATA:  Chest pain EXAM: PORTABLE CHEST 1 VIEW COMPARISON:  04/11/2021 FINDINGS: Lungs are clear.  No pleural effusion or pneumothorax. The heart is normal in size.  Thoracic aortic atherosclerosis. IMPRESSION: No evidence of acute cardiopulmonary disease. Electronically Signed   By: Julian Hy M.D.   On: 04/13/2021 22:02  ? ? ?   ?Assessment & Plan:  ? ?Problem List Items Addressed This Visit   ? ? Cerebral atrophy ([place])  ?  Noted on previous CT scan.  On aricept.  Follow.  ?  ?  ? Chest pain in adult  ?  Recently evaluated in ER for burning sensation in her chest.  W/up unrevealing.  Felt to be  related to GI origin.  Recommended starting pepcid. Follow. Saw cardiology recently.  zio monitor - unable to complete.  Per cardiology, with regards to symptoms of possible chest pain associated with palpitations, he would pref

## 2021-05-02 ENCOUNTER — Encounter: Payer: Self-pay | Admitting: Internal Medicine

## 2021-05-02 NOTE — Assessment & Plan Note (Signed)
Good family support.  Continue zoloft.  ?

## 2021-05-02 NOTE — Assessment & Plan Note (Signed)
On aricept.  Discussed formal neurological evaluation.  ?

## 2021-05-02 NOTE — Assessment & Plan Note (Signed)
Off plavix.  ECASA '81mg'$  q day.  Follow.   ?

## 2021-05-02 NOTE — Assessment & Plan Note (Signed)
Noted on previous CT scan.  On aricept.  Follow.  

## 2021-05-02 NOTE — Assessment & Plan Note (Signed)
Recently evaluated in ER for burning sensation in her chest.  W/up unrevealing.  Felt to be related to GI origin.  Recommended starting pepcid. Follow. Saw cardiology recently.  zio monitor - unable to complete.  Per cardiology, with regards to symptoms of possible chest pain associated with palpitations, he would prefer to avoid stress testing  It was decided to proceed with a trial of low-dose propanolol.  ?

## 2021-05-02 NOTE — Assessment & Plan Note (Addendum)
Has seen hematology previously - required iron infusion.  Check cbc and iron studies. hgb wnl on recent check.  

## 2021-05-02 NOTE — Assessment & Plan Note (Signed)
On amlodipine.  Blood pressure as outlined.  Follow pressures.  Follow metabolic panel.  Doing better.  ?

## 2021-05-02 NOTE — Assessment & Plan Note (Signed)
Off pravastatin.  Age 88.  Hold on starting new medication at this time.   

## 2021-05-05 DIAGNOSIS — R059 Cough, unspecified: Secondary | ICD-10-CM | POA: Diagnosis not present

## 2021-05-05 DIAGNOSIS — Z20822 Contact with and (suspected) exposure to covid-19: Secondary | ICD-10-CM | POA: Diagnosis not present

## 2021-05-05 DIAGNOSIS — R051 Acute cough: Secondary | ICD-10-CM | POA: Diagnosis not present

## 2021-05-20 DIAGNOSIS — Z20822 Contact with and (suspected) exposure to covid-19: Secondary | ICD-10-CM | POA: Diagnosis not present

## 2021-05-25 ENCOUNTER — Other Ambulatory Visit: Payer: Self-pay | Admitting: Internal Medicine

## 2021-05-25 DIAGNOSIS — Z20822 Contact with and (suspected) exposure to covid-19: Secondary | ICD-10-CM | POA: Diagnosis not present

## 2021-05-26 ENCOUNTER — Telehealth: Payer: Self-pay | Admitting: Cardiovascular Disease

## 2021-05-26 NOTE — Telephone Encounter (Signed)
Patient c/o Palpitations:  High priority if patient c/o lightheadedness, shortness of breath, or chest pain ? ?How long have you had palpitations/irregular HR/ Afib? Has been irregular heart rate for a couple of weeks.  Are you having the symptoms now? no ? ?Are you currently experiencing lightheadedness, SOB or CP? Has some chest pain with it, and sometimes feels dizzy ? ?Do you have a history of afib (atrial fibrillation) or irregular heart rhythm? Has history of irregular heart rhythm ? ?Have you checked your BP or HR? (document readings if available): 127/70 he doesn't have HR ? ?Are you experiencing any other symptoms? no  ?

## 2021-05-26 NOTE — Telephone Encounter (Signed)
Called patients son Legrand Como back and left a VM informing him that I was able to schedule his mom with Dr. Rockey Situ this Friday at 10:20 and requested that he call us back to confirm if this will work for them. Will reach out to him again this afternoon.  ?

## 2021-05-26 NOTE — Telephone Encounter (Signed)
Attempted to reach Legrand Como got VM again. ?

## 2021-05-26 NOTE — Telephone Encounter (Signed)
Son returned call, he accepted the appt for this Friday 5/5 at 10:20am.  ?

## 2021-05-26 NOTE — Progress Notes (Signed)
Cardiology Office Note ? ?Date:  05/28/2021  ? ?ID:  Rachel Martinez, DOB 12-06-1927, MRN [id number] ? ?PCP:  Einar Pheasant, MD  ? ?Chief Complaint  ?Patient presents with  ? Palpitations  ?  Patient c/o dizziness, palpitations and chest pain. Medications reviewed by the patient verbally.   ? ? ?HPI:  ?Rachel Martinez is a 88 year old woman with past medical history of ?Dementia ?Palpitations, chest pain ?Protein calorie malnutrition ?Who presents for follow-up of her chest pain, arrhythmia/palpitations ? ?Last seen by myself in clinic February 03, 2021 ?Presents today with her son  ?Lives with son and daughter-in-law ? ?Seen in the emergency room March 19 and April 13, 2021 for elevated blood pressure, hospital records reviewed ?Son feels blood pressure was elevated from chest pain and stress ?Pressure recorded 190/70 ?Was started on amlodipine 5 mg daily ? ?On review of records March 19 reported having funny feeling in her chest, palpitations ?Zio monitor was previously ordered January 2023 but not completed ?Pulled zio off on the first night, "dementia" ? ?BP running low at home ?"Stress running the BP up in the ER" ?Son feels she is doing better on SSRI started by primary care ? ?Takes propranolol for palpitations ? ?Weight down 3 pounds since jan 2023 ?Drinking Ensure ? ?EKG personally reviewed by myself on todays visit ?NSR rate 56 bpm no ST or T wave changes ? ?Other past medical history reviewed ?Previously referred to cardiology October 2020, loss of consciousness, bradycardia, no showed the appointment ? ?Refer to neurology October 2018, found down, altered mental status ? ?Echocardiogram October 2018 normal ejection fraction ? ?She presents today with her son ?Currently living with son, daughter-in-law ?He reports that she is frail, appetite is okay but she has been losing weight ?No falls but does have unsteady gait at times ?Periodically will report palpitations, funny feeling in her chest ?On discussion  of whether she is having chest pain, reports she is able to ambulate fine with no chest pain ?She will go for car rides, does not seem to venture into the stores or go shopping much ? ? ? ?PMH:   has a past medical history of Anemia, Chicken pox, CVA (cerebral vascular accident) (Rachel Martinez) (8/06), Depression, GERD (gastroesophageal reflux disease), Heart palpitations, Hypercholesteremia, Hypertension, and Stroke (Rachel Martinez). ? ?PSH:    ?Past Surgical History:  ?Procedure Laterality Date  ? APPENDECTOMY    ? ? ?Current Outpatient Medications  ?Medication Sig Dispense Refill  ? amLODipine (NORVASC) 5 MG tablet Take 1 tablet (5 mg total) by mouth daily. 30 tablet 2  ? ASPIRIN ADULT LOW DOSE PO Take 1 tablet by mouth daily.    ? calcium carbonate (TUMS - DOSED IN MG ELEMENTAL CALCIUM) 500 MG chewable tablet Chew 1 tablet by mouth 3 (three) times daily as needed.     ? Cyanocobalamin (VITAMIN B 12 PO) Take 100 mcg by mouth daily.    ? donepezil (ARICEPT) 5 MG tablet DO NOT FILL. THIS PRESCRIPTION WAS ON HOLD AT THE PREVIOUS PHARMACY, PLEASE CONTACT THE PRESCRIBER TO OBTAIN A NEW PRESCRIPTION. 90 tablet 1  ? propranolol (INDERAL) 10 MG tablet Take 1 tablet (10 mg total) by mouth 2 (two) times daily as needed (As needed for palpitations). 180 tablet 0  ? sertraline (ZOLOFT) 50 MG tablet TAKE 1 TABLET BY MOUTH EVERY DAY 90 tablet 1  ? ?No current facility-administered medications for this visit.  ? ? ?Allergies:   Amoxicillin-pot clavulanate, Ciprofloxacin, Codeine sulfate, Imipenem, and Metronidazole  ? ?  Social History:  The patient  reports that she quit smoking about 17 years ago. Her smoking use included cigarettes. She has a 7.50 pack-year smoking history. She has never used smokeless tobacco. She reports that she does not drink alcohol and does not use drugs.  ? ?Family History:   family history includes Breast cancer in her paternal aunt; CVA in her mother; Cancer in her brother, sister, and sister; Diabetes in her father,  sister, sister, and son; Heart disease in her father; Hypertension in her sister; Leukemia in her brother; Lung disease in her brother; Mental illness in her brother.  ? ? ?Review of Systems: ?Review of Systems  ?Constitutional: Negative.   ?HENT: Negative.    ?Respiratory: Negative.    ?Cardiovascular: Negative.   ?Gastrointestinal: Negative.   ?Musculoskeletal: Negative.   ?Neurological: Negative.   ?Psychiatric/Behavioral: Negative.    ?All other systems reviewed and are negative. ? ? ?PHYSICAL EXAM: ?VS:  BP 108/64 (BP Location: Left Arm, Patient Position: Sitting, Cuff Size: Normal)   Pulse (!) 56   Ht '5\' 3"'$  (1.6 m)   Wt 92 lb 8 oz (42 kg)   SpO2 94%   BMI 16.39 kg/m?  , BMI Body mass index is 16.39 kg/m?Marland Kitchen ?Constitutional:  oriented to person, place, and time. No distress.  ?HENT:  ?Head: Grossly normal ?Eyes:  no discharge. No scleral icterus.  ?Neck: No JVD, no carotid bruits  ?Cardiovascular: Regular rate and rhythm, no murmurs appreciated ?Pulmonary/Chest: Clear to auscultation bilaterally, no wheezes or rails ?Abdominal: Soft.  no distension.  no tenderness.  ?Musculoskeletal: Normal range of motion ?Neurological:  normal muscle tone. Coordination normal. No atrophy ?Skin: Skin warm and dry ?Psychiatric: normal affect, pleasant ? ? ?Recent Labs: ?01/28/2021: TSH 1.94 ?04/13/2021: ALT 12; B Natriuretic Peptide 119.4; BUN 29; Creatinine, Ser 1.13; Hemoglobin 12.0; Platelets 180; Potassium 4.3; Sodium 139  ? ? ?Lipid Panel ?Lab Results  ?Component Value Date  ? CHOL 227 (H) 11/06/2020  ? HDL 74.30 11/06/2020  ? LDLCALC 130 (H) 11/06/2020  ? TRIG 114.0 11/06/2020  ? ?  ? ?Wt Readings from Last 3 Encounters:  ?05/28/21 92 lb 8 oz (42 kg)  ?04/21/21 96 lb 6.4 oz (43.7 kg)  ?04/13/21 103 lb 6.4 oz (46.9 kg)  ?  ? ?ASSESSMENT AND PLAN: ? ?Problem List Items Addressed This Visit   ?None ?Visit Diagnoses   ? ? Paroxysmal tachycardia ([place])    -  Primary  ? Relevant Orders  ? EKG 12-Lead  ? Essential hypertension       ? Relevant Orders  ? EKG 12-Lead  ? Other ventricular tachycardia ([place])      ? Relevant Orders  ? EKG 12-Lead  ? Dementia, unspecified dementia severity, unspecified dementia type, unspecified whether behavioral, psychotic, or mood disturbance or anxiety ([place])      ? Moderate dementia without behavioral disturbance, psychotic disturbance, mood disturbance, or anxiety, unspecified dementia type (Rachel Martinez)      ? Protein-calorie malnutrition, moderate (Rachel Martinez)      ? ?  ?Palpitations ?Prior attempted Zio monitor unsuccessful,  ?Son reports given her dementia, she pulls the monitor off. ?They have bought a cardia mobile device and will monitor heart rate that way at home when she has symptoms ?Continue to take propranolol as needed for breakthrough tachypalpitations ? ?Chest pain ?Not a good candidate for ischemic work-up given dementia ?Given prescription for nitro to take as needed ?Recommended to son that she lay down before she takes nitro and only  take for significant chest pain ? ?Protein calorie malnutrition ?Stressed the importance that she increase her calorie intake ?Weight down 3 pounds since January ? ?Dementia ?Son reports memory loss is moderate ?On Aricept ?Lives with son and daughter-in-law ? ?Hypertension ?Orthostatic today with standing systolic pressures down to 80/50, reports lightheadedness ?We will stop the amlodipine and only use amlodipine for systolic pressure over 301, take as needed ?Son is in agreement as they report they are getting low blood pressures at home ?Recommended she hydrate better ? ? Total encounter time more than 30 minutes ? Greater than 50% was spent in counseling and coordination of care with the patient ? ? ? ?Signed, ?Esmond Plants, M.D., Ph.D. ?[facility] [place], [place] ?[phone] ?

## 2021-05-27 NOTE — Telephone Encounter (Signed)
Noted     Closing encounter

## 2021-05-28 ENCOUNTER — Ambulatory Visit (INDEPENDENT_AMBULATORY_CARE_PROVIDER_SITE_OTHER): Payer: Medicare Other | Admitting: Cardiovascular Disease

## 2021-05-28 ENCOUNTER — Encounter: Payer: Self-pay | Admitting: Cardiovascular Disease

## 2021-05-28 VITALS — BP 108/64 | HR 56 | Ht 63.0 in | Wt 92.5 lb

## 2021-05-28 DIAGNOSIS — I10 Essential (primary) hypertension: Secondary | ICD-10-CM

## 2021-05-28 DIAGNOSIS — F0390 Unspecified dementia without behavioral disturbance: Secondary | ICD-10-CM

## 2021-05-28 DIAGNOSIS — F03B0 Unspecified dementia, moderate, without behavioral disturbance, psychotic disturbance, mood disturbance, and anxiety: Secondary | ICD-10-CM

## 2021-05-28 DIAGNOSIS — I4729 Other ventricular tachycardia: Secondary | ICD-10-CM | POA: Diagnosis not present

## 2021-05-28 DIAGNOSIS — I479 Paroxysmal tachycardia, unspecified: Secondary | ICD-10-CM

## 2021-05-28 DIAGNOSIS — E440 Moderate protein-calorie malnutrition: Secondary | ICD-10-CM | POA: Diagnosis not present

## 2021-05-28 MED ORDER — AMLODIPINE BESYLATE 5 MG PO TABS: 5.0000 mg | ORAL_TABLET | Freq: Every day | ORAL | 2 refills | Status: DC

## 2021-05-28 MED ORDER — NITROGLYCERIN 0.4 MG SL SUBL: 0.4000 mg | SUBLINGUAL_TABLET | SUBLINGUAL | 3 refills | Status: AC | PRN

## 2021-05-28 NOTE — Patient Instructions (Addendum)
Medication Instructions:  ?Please stop amlodipine ?Ok to take for high blood pressure >160 ? ?NTG SL as needed for chest pain ? ?If you need a refill on your cardiac medications before your next appointment, please call your pharmacy.  ? ?Lab work: ?No new labs needed ? ?Testing/Procedures: ?No new testing needed ? ?Follow-Up: ?At [facility], you and your health needs are our priority.  As part of our continuing mission to provide you with exceptional heart care, we have created designated Provider Care Teams.  These Care Teams include your primary Cardiologist (physician) and Advanced Practice Providers (APPs -  Physician Assistants and Nurse Practitioners) who all work together to provide you with the care you need, when you need it. ? ?You will need a follow up appointment in 6 months, APP or Matea Stanard ok ? ?Providers on your designated Care Team:   ?Murray Hodgkins, NP ?Christell Faith, PA-C ?Cadence Kathlen Mody, PA-C ? ?COVID-19 Vaccine Information can be found at: [email or web address] For questions related to vaccine distribution or appointments, please email [email or web address] or call [phone].  ? ?

## 2021-05-30 DIAGNOSIS — Z20822 Contact with and (suspected) exposure to covid-19: Secondary | ICD-10-CM | POA: Diagnosis not present

## 2021-06-01 DIAGNOSIS — Z20822 Contact with and (suspected) exposure to covid-19: Secondary | ICD-10-CM | POA: Diagnosis not present

## 2021-06-03 DIAGNOSIS — Z20822 Contact with and (suspected) exposure to covid-19: Secondary | ICD-10-CM | POA: Diagnosis not present

## 2021-06-07 ENCOUNTER — Telehealth: Payer: Self-pay

## 2021-06-07 ENCOUNTER — Ambulatory Visit (INDEPENDENT_AMBULATORY_CARE_PROVIDER_SITE_OTHER): Payer: Medicare Other

## 2021-06-07 VITALS — Ht 63.0 in | Wt 92.0 lb

## 2021-06-07 DIAGNOSIS — Z0000 Encounter for general adult medical examination without abnormal findings: Secondary | ICD-10-CM

## 2021-06-07 NOTE — Progress Notes (Signed)
Subjective:   Rachel Martinez is a 88 y.o. female who presents for Medicare Annual (Subsequent) preventive examination.  Review of Systems    No ROS.  Medicare Wellness Virtual Visit.  Visual/audio telehealth visit, UTA vital signs.   See social history for additional risk factors.   Cardiac Risk Factors include: advanced age (>75men, >7 women)     Objective:    Today's Vitals   06/07/21 1234  Weight: 92 lb (41.7 kg)  Height: 5\' 3"  (1.6 m)   Body mass index is 16.3 kg/m.     06/07/2021   12:37 PM 04/13/2021    9:47 PM 04/11/2021    1:21 PM 06/01/2020    1:31 PM 05/30/2019    1:52 PM 07/18/2018    7:55 PM 05/29/2018    4:43 PM  Advanced Directives  Does Patient Have a Medical Advance Directive? No Unable to assess, patient is non-responsive or altered mental status No No No No Yes  Type of Advance Directive       Healthcare Power of Attorney  Does patient want to make changes to medical advance directive?       No - Patient declined  Copy of Healthcare Power of Attorney in Chart?       No - copy requested  Would patient like information on creating a medical advance directive? No - Patient declined  No - Patient declined No - Patient declined No - Patient declined No - Patient declined     Current Medications (verified) Outpatient Encounter Medications as of 06/07/2021  Medication Sig   amLODipine (NORVASC) 5 MG tablet Take 1 tablet (5 mg total) by mouth daily. Take for SBP greater than 160   ASPIRIN ADULT LOW DOSE PO Take 1 tablet by mouth daily.   calcium carbonate (TUMS - DOSED IN MG ELEMENTAL CALCIUM) 500 MG chewable tablet Chew 1 tablet by mouth 3 (three) times daily as needed.    Cyanocobalamin (VITAMIN B 12 PO) Take 100 mcg by mouth daily.   donepezil (ARICEPT) 5 MG tablet DO NOT FILL. THIS PRESCRIPTION WAS ON HOLD AT THE PREVIOUS PHARMACY, PLEASE CONTACT THE PRESCRIBER TO OBTAIN A NEW PRESCRIPTION.   nitroGLYCERIN (NITROSTAT) 0.4 MG SL tablet Place 1 tablet (0.4 mg  total) under the tongue every 5 (five) minutes as needed for chest pain.   propranolol (INDERAL) 10 MG tablet Take 1 tablet (10 mg total) by mouth 2 (two) times daily as needed (As needed for palpitations).   sertraline (ZOLOFT) 50 MG tablet TAKE 1 TABLET BY MOUTH EVERY DAY   No facility-administered encounter medications on file as of 06/07/2021.    Allergies (verified) Amoxicillin-pot clavulanate, Ciprofloxacin, Codeine sulfate, Imipenem, and Metronidazole   History: Past Medical History:  Diagnosis Date   Anemia    Chicken pox    CVA (cerebral vascular accident) (HCC) 8/06   Depression    GERD (gastroesophageal reflux disease)    Heart palpitations    Hypercholesteremia    Hypertension    Stroke [facility])    Past Surgical History:  Procedure Laterality Date   APPENDECTOMY     Family History  Problem Relation Age of Onset   CVA Mother    Diabetes Father    Heart disease Father    Cancer Sister        breast    Diabetes Sister    Cancer Brother        stomach   Leukemia Brother    Lung disease Brother  Hypertension Sister    Cancer Sister        stomach   Diabetes Sister    Mental illness Brother        suicide   Breast cancer Paternal Aunt    Diabetes Son    Social History   Socioeconomic History   Marital status: Widowed    Spouse name: Not on file   Number of children: Not on file   Years of education: Not on file   Highest education level: Not on file  Occupational History   Not on file  Tobacco Use   Smoking status: Former    Packs/day: 0.25    Years: 30.00    Pack years: 7.50    Types: Cigarettes    Quit date: 09/13/2003    Years since quitting: 17.7   Smokeless tobacco: Never  Vaping Use   Vaping Use: Never used  Substance and Sexual Activity   Alcohol use: No    Alcohol/week: 0.0 standard drinks   Drug use: No   Sexual activity: Never  Other Topics Concern   Not on file  Social History Narrative   Not on file   Social Determinants of  Health   Financial Resource Strain: Low Risk    Difficulty of Paying Living Expenses: Not hard at all  Food Insecurity: No Food Insecurity   Worried About Programme researcher, broadcasting/film/video in the Last Year: Never true   Ran Out of Food in the Last Year: Never true  Transportation Needs: No Transportation Needs   Lack of Transportation (Medical): No   Lack of Transportation (Non-Medical): No  Physical Activity: Not on file  Stress: No Stress Concern Present   Feeling of Stress : Not at all  Social Connections: Unknown   Frequency of Communication with Friends and Family: More than three times a week   Frequency of Social Gatherings with Friends and Family: Not on file   Attends Religious Services: Not on file   Active Member of Clubs or Organizations: Not on file   Attends Banker Meetings: Not on file   Marital Status: Not on file    Tobacco Counseling Counseling given: Not Answered   Clinical Intake:  Pre-visit preparation completed: Yes        Diabetes: No  How often do you need to have someone help you when you read instructions, pamphlets, or other written materials from your doctor or pharmacy?: 5 - Always   Interpreter Needed?: No    Activities of Daily Living    06/07/2021   12:40 PM  In your present state of health, do you have any difficulty performing the following activities:  Hearing? 1  Comment Hearing aids worn at times  Vision? 0  Difficulty concentrating or making decisions? 1  Comment Memory change. Taking Aricept as directed.  Walking or climbing stairs? 1  Comment Family assist  Dressing or bathing? 1  Comment Family assist  Doing errands, shopping? 1  Comment Family Ship broker and eating ? Y  Comment Family assist. Self feeds.  Using the Toilet? N  In the past six months, have you accidently leaked urine? Y  Comment Managed with daily brief  Do you have problems with loss of bowel control? N  Managing your Medications? Y   Comment Family assist  Managing your Finances? Y  Comment Family assist  Housekeeping or managing your Housekeeping? Y  Comment Family assist    Patient Care Team: Dale Rosemont, MD as  PCP - General (Internal Medicine) Mariah Milling Tollie Pizza, MD as PCP - Cardiology (Cardiology)  Indicate any recent Medical Services you may have received from other than [facility] providers in the past year (date may be approximate).     Assessment:   This is a routine wellness examination for Rachel Martinez.  Virtual Visit via Telephone Note  I connected with  Rachel Martinez on 06/07/21 at 12:30 PM EDT by telephone and verified that I am speaking with the correct person using two identifiers.  Persons participating in the virtual visit: patient/son/Nurse Health Advisor   I discussed the limitations of service by telehealth. The patient expressed understanding and agreed to proceed. We continued and completed visit with audio only. Some vital signs may be absent or patient reported.   Hearing/Vision screen Hearing Screening - Comments:: Hearing aid, bilateral  Vision Screening - Comments:: Followed by [facility] Wears corrective lenses  Cataract extraction, L eye She tries to see her ophthalmologist every 12 months.  Dietary issues and exercise activities discussed:   Healthy diet   Goals Addressed               This Visit's Progress     Patient Stated     Healthy Lifestyle  (pt-stated)   On track     Drink plenty of fluids Eat 2 good meals daily Stay active       Depression Screen    06/07/2021   12:39 PM 11/06/2020    9:05 AM 06/01/2020    1:25 PM 05/30/2019    1:55 PM 10/15/2018   10:03 AM 05/29/2018    4:45 PM 05/25/2017    1:30 PM  PHQ 2/9 Scores  PHQ - 2 Score 0 0 0 0 0 0 0    Fall Risk    11/06/2020    9:05 AM 06/01/2020    1:28 PM 05/30/2019    1:53 PM 10/15/2018   10:02 AM 05/29/2018    4:45 PM  Fall Risk   Falls in the past year? 1 0 1 1 0  Number falls in past yr: 0 0  0 0   Injury with Fall? 0 0 0 1   Comment   Foot was caught on item while walking, fell on knee, denies being hurt. Discussed keeping walkways clear. Declines follow up for this fall 2 weeks ago.    Risk for fall due to : Impaired balance/gait;Mental status change      Follow up Falls evaluation completed Falls evaluation completed Falls evaluation completed Falls evaluation completed     FALL RISK PREVENTION PERTAINING TO THE HOME:  Home free of loose throw rugs in walkways, pet beds, electrical cords, etc? Yes  Adequate lighting in your home to reduce risk of falls? Yes   ASSISTIVE DEVICES UTILIZED TO PREVENT FALLS: Use of a cane, walker or w/c? Yes , at times  TIMED UP AND GO: Was the test performed? No .   Cognitive Function: Patient is alert.  Taking Aricept as directed.     06/19/2020    2:56 PM  MMSE - Mini Mental State Exam  Orientation to time 0  Orientation to Place 5  Registration 0  Attention/ Calculation 0  Recall 0  Language- name 2 objects 2  Language- repeat 0  Language- follow 3 step command 2  Language- read & follow direction 0  Write a sentence 0  Copy design 0  Total score 9        05/30/2019  1:55 PM 05/29/2018    4:42 PM 05/25/2017    1:39 PM 05/24/2016    1:46 PM  6CIT Screen  What Year? 0 points 0 points 0 points 0 points  What month? 3 points 0 points 0 points 0 points  What time? 0 points 0 points 0 points 0 points  Count back from 20 0 points 0 points 0 points 0 points  Months in reverse  0 points 0 points 0 points  Repeat phrase  0 points 0 points 0 points  Total Score  0 points 0 points 0 points    Immunizations Immunization History  Administered Date(s) Administered   Fluad Quad(high Dose 65+) 10/26/2018, 11/06/2020   Influenza Split 11/23/2011, 11/12/2012   Influenza, High Dose Seasonal PF 10/29/2015, 10/23/2016, 12/08/2017   Influenza,inj,Quad PF,6+ Mos 11/20/2013   PFIZER(Purple Top)SARS-COV-2 Vaccination 11/20/2019, 12/13/2019    Pneumococcal Polysaccharide-23 10/23/2016   Screening Tests Health Maintenance  Topic Date Due   Zoster Vaccines- Shingrix (1 of 2) 06/19/2021 (Originally 03/11/1977)   COVID-19 Vaccine (3 - Booster for Pfizer series) 06/23/2021 (Originally 02/07/2020)   Pneumonia Vaccine 11+ Years old (2 - PCV) 04/13/2022 (Originally 10/23/2017)   DEXA SCAN  06/08/2022 (Originally 03/10/1994)   INFLUENZA VACCINE  08/24/2021   HPV VACCINES  Aged Out   MAMMOGRAM  Discontinued   TETANUS/TDAP  Discontinued   Health Maintenance There are no preventive care reminders to display for this patient.  Lung Cancer Screening: (Low Dose CT Chest recommended if Age 85-80 years, 30 pack-year currently smoking OR have quit w/in 15years.) qualify.   Vision Screening: Recommended annual ophthalmology exams for early detection of glaucoma and other disorders of the eye.  Dental Screening: Recommended annual dental exams for proper oral hygiene  Community Resource Referral / Chronic Care Management: CRR required this visit?  No   CCM required this visit?  No      Plan:   Keep all routine maintenance appointments.   I have personally reviewed and noted the following in the patient's chart:   Medical and social history Use of alcohol, tobacco or illicit drugs  Current medications and supplements including opioid prescriptions.  Functional ability and status Nutritional status Physical activity Advanced directives List of other physicians Hospitalizations, surgeries, and ER visits in previous 12 months Vitals Screenings to include cognitive, depression, and falls Referrals and appointments  In addition, I have reviewed and discussed with patient certain preventive protocols, quality metrics, and best practice recommendations. A written personalized care plan for preventive services as well as general preventive health recommendations were provided to patient.     Ashok Pall, LPN   1/61/0960

## 2021-06-07 NOTE — Patient Instructions (Addendum)
?  [name] , ?Thank you for taking time to come for your Medicare Wellness Visit. I appreciate your ongoing commitment to your health goals. Please review the following plan we discussed and let me know if I can assist you in the future.  ? ?These are the goals we discussed: ? Goals   ? ?  ? Patient Stated  ?   Healthy Lifestyle  (pt-stated)   ?   Drink plenty of fluids ?Eat 2 good meals daily ?Stay active ?  ? ?  ?  ?This is a list of the screening recommended for you and due dates:  ?Health Maintenance  ?Topic Date Due  ? Zoster (Shingles) Vaccine (1 of 2) 06/19/2021*  ? COVID-19 Vaccine (3 - Booster for Pfizer series) 06/23/2021*  ? Pneumonia Vaccine (2 - PCV) 04/13/2022*  ? DEXA scan (bone density measurement)  06/08/2022*  ? Flu Shot  08/24/2021  ? HPV Vaccine  Aged Out  ? Mammogram  Discontinued  ? Tetanus Vaccine  Discontinued  ?*Topic was postponed. The date shown is not the original due date.  ? ? ?

## 2021-06-07 NOTE — Telephone Encounter (Signed)
Patient's son notes patient has been waking at night for about a month now and requests medication or suggestion to aid in sleep. Next scheduled office visit is 5/31 and is hoping to have assistance before appointment. Please advise.  ?

## 2021-06-08 ENCOUNTER — Telehealth: Payer: Self-pay | Admitting: Internal Medicine

## 2021-06-08 ENCOUNTER — Other Ambulatory Visit: Payer: Self-pay | Admitting: Internal Medicine

## 2021-06-08 DIAGNOSIS — R413 Other amnesia: Secondary | ICD-10-CM

## 2021-06-08 NOTE — Progress Notes (Signed)
Order placed for neurology referral.   

## 2021-06-08 NOTE — Telephone Encounter (Signed)
Per last message, he is going to try melatonin '6mg'$  and see if helps. Let us know if persistent problem.  Also, we had discussed her memory issue.  Make sure he is ok with her having a formal neurology evaluation.  Order has been placed for referral.  Can cancel if he desires not to pursue.  Let me know if any problems.   ?

## 2021-06-08 NOTE — Telephone Encounter (Signed)
Has she tried melatonin.  Can start with '3mg'$  melatonin an hour before bed.   ?

## 2021-06-08 NOTE — Telephone Encounter (Signed)
S/w pt son - stated they were using '3mg'$  Melatonin.  ?Pt son advised can use '6mg'$ . ?Try , and if still not helping , call to schedule eval  ?

## 2021-06-08 NOTE — Telephone Encounter (Signed)
Lm for pt son to cb ?

## 2021-06-08 NOTE — Telephone Encounter (Signed)
Not sure what dose they are using, but she can increase to '6mg'$  if not already taking an increased dose.  If already taking an increased dose, than can see about scheduling an earlier appt to discuss - so can determine what medication would be best for her.  Is she eating?   ?

## 2021-06-11 ENCOUNTER — Telehealth: Payer: Self-pay | Admitting: Cardiovascular Disease

## 2021-06-11 NOTE — Telephone Encounter (Signed)
Returned the call to the pt son. Lmtcb.

## 2021-06-11 NOTE — Telephone Encounter (Signed)
Patient c/o Palpitations:  High priority if patient c/o lightheadedness, shortness of breath, or chest pain  How long have you had palpitations/irregular HR/ Afib? 3 to 4 days Are you having the symptoms now? Yes   Are you currently experiencing lightheadedness, SOB or CP? Having some chest pain. States her chest feels funny.   Do you have a history of afib (atrial fibrillation) or irregular heart rhythm? Not until recently son states.   Have you checked your BP or HR? (document readings if available): 127/72  Are you experiencing any other symptoms? No

## 2021-06-13 ENCOUNTER — Other Ambulatory Visit: Payer: Self-pay | Admitting: Internal Medicine

## 2021-06-14 NOTE — Telephone Encounter (Signed)
Called pt's son. No answer. Lmtcb.

## 2021-06-15 ENCOUNTER — Encounter: Payer: Self-pay | Admitting: Podiatry

## 2021-06-15 ENCOUNTER — Ambulatory Visit (INDEPENDENT_AMBULATORY_CARE_PROVIDER_SITE_OTHER): Payer: Medicare Other | Admitting: Podiatry

## 2021-06-15 DIAGNOSIS — M79674 Pain in right toe(s): Secondary | ICD-10-CM | POA: Diagnosis not present

## 2021-06-15 DIAGNOSIS — B351 Tinea unguium: Secondary | ICD-10-CM

## 2021-06-15 DIAGNOSIS — M79675 Pain in left toe(s): Secondary | ICD-10-CM | POA: Diagnosis not present

## 2021-06-15 NOTE — Progress Notes (Signed)
  Subjective:  Patient ID: Rachel Martinez, female    DOB: September 03, 1927,  MRN: [id number]  No chief complaint on file.  88 y.o. female returns for the above complaint.  Patient presents with thickened elongated dystrophic toenails x10.  Mild pain on palpation.  Patient would like to have them debrided down.  She states can start to cause her ingrown.  She denies any other acute complaints.  She has not seen anyone else prior to seeing me.  Objective:  There were no vitals filed for this visit. Podiatric Exam: Vascular: dorsalis pedis and posterior tibial pulses are palpable bilateral. Capillary return is immediate. Temperature gradient is WNL. Skin turgor WNL  Sensorium: Normal Semmes Weinstein monofilament test. Normal tactile sensation bilaterally. Nail Exam: Pt has thick disfigured discolored nails with subungual debris noted bilateral entire nail hallux through fifth toenails.  Pain on palpation to the nails. Ulcer Exam: There is no evidence of ulcer or pre-ulcerative changes or infection. Orthopedic Exam: Muscle tone and strength are WNL. No limitations in general ROM. No crepitus or effusions noted.  Skin: No Porokeratosis. No infection or ulcers    Assessment & Plan:   1. Pain due to onychomycosis of toenails of both feet      Patient was evaluated and treated and all questions answered.  Onychomycosis with pain  -Nails palliatively debrided as below. -Educated on self-care  Procedure: Nail Debridement Rationale: pain  Type of Debridement: manual, sharp debridement. Instrumentation: Nail nipper, rotary burr. Number of Nails: 10  Procedures and Treatment: Consent by patient was obtained for treatment procedures. The patient understood the discussion of treatment and procedures well. All questions were answered thoroughly reviewed. Debridement of mycotic and hypertrophic toenails, 1 through 5 bilateral and clearing of subungual debris. No ulceration, no infection noted.   Return Visit-Office Procedure: Patient instructed to return to the office for a follow up visit 3 months for continued evaluation and treatment.  Boneta Lucks, DPM    No follow-ups on file.

## 2021-06-23 ENCOUNTER — Ambulatory Visit (INDEPENDENT_AMBULATORY_CARE_PROVIDER_SITE_OTHER): Payer: Medicare Other | Admitting: Internal Medicine

## 2021-06-23 ENCOUNTER — Encounter: Payer: Self-pay | Admitting: Internal Medicine

## 2021-06-23 DIAGNOSIS — G319 Degenerative disease of nervous system, unspecified: Secondary | ICD-10-CM | POA: Diagnosis not present

## 2021-06-23 DIAGNOSIS — F419 Anxiety disorder, unspecified: Secondary | ICD-10-CM

## 2021-06-23 DIAGNOSIS — E7800 Pure hypercholesterolemia, unspecified: Secondary | ICD-10-CM

## 2021-06-23 DIAGNOSIS — D509 Iron deficiency anemia, unspecified: Secondary | ICD-10-CM

## 2021-06-23 DIAGNOSIS — I10 Essential (primary) hypertension: Secondary | ICD-10-CM | POA: Diagnosis not present

## 2021-06-23 DIAGNOSIS — R002 Palpitations: Secondary | ICD-10-CM

## 2021-06-23 DIAGNOSIS — Z8673 Personal history of transient ischemic attack (TIA), and cerebral infarction without residual deficits: Secondary | ICD-10-CM | POA: Diagnosis not present

## 2021-06-23 DIAGNOSIS — R634 Abnormal weight loss: Secondary | ICD-10-CM | POA: Diagnosis not present

## 2021-06-23 NOTE — Progress Notes (Signed)
Patient ID: Rachel Martinez, female   DOB: 1927-11-15, 88 y.o.   MRN: [id number]   Subjective:    Patient ID: Rachel Martinez, female    DOB: [birth year], 88 y.o.   MRN: [id number]   Patient here for a scheduled follow up.   Chief Complaint  Patient presents with   Follow-up   Hypertension   Hyperlipidemia   Anxiety   Weight Loss   .   HPI She is accompanied by her son.  History obtained from both of them.  Reports she is eating.  Lives with her son now.  Will report intermittent "funny sensation" in her chest.  States when she reports this, his wife checks cardio mobile and reports afib.  Breathing appears to be stable.  Does not do a lot of activity around the house.  No abdominal pain.  Bowels moving.  Reports pain - left toe.  Sleeps a lot.     Past Medical History:  Diagnosis Date   Anemia    Chicken pox    CVA (cerebral vascular accident) ([place]) 8/06   Depression    GERD (gastroesophageal reflux disease)    Heart palpitations    Hypercholesteremia    Hypertension    Stroke [facility])    Past Surgical History:  Procedure Laterality Date   APPENDECTOMY     Family History  Problem Relation Age of Onset   CVA Mother    Diabetes Father    Heart disease Father    Cancer Sister        breast    Diabetes Sister    Cancer Brother        stomach   Leukemia Brother    Lung disease Brother    Hypertension Sister    Cancer Sister        stomach   Diabetes Sister    Mental illness Brother        suicide   Breast cancer Paternal Aunt    Diabetes Son    Social History   Socioeconomic History   Marital status: Widowed    Spouse name: Not on file   Number of children: Not on file   Years of education: Not on file   Highest education level: Not on file  Occupational History   Not on file  Tobacco Use   Smoking status: Former    Packs/day: 0.25    Years: 30.00    Pack years: 7.50    Types: Cigarettes    Quit date: 09/13/2003    Years since quitting: 17.8    Smokeless tobacco: Never  Vaping Use   Vaping Use: Never used  Substance and Sexual Activity   Alcohol use: No    Alcohol/week: 0.0 standard drinks   Drug use: No   Sexual activity: Never  Other Topics Concern   Not on file  Social History Narrative   Not on file   Social Determinants of Health   Financial Resource Strain: Low Risk    Difficulty of Paying Living Expenses: Not hard at all  Food Insecurity: No Food Insecurity   Worried About Charity fundraiser in the Last Year: Never true   [place]ila in the Last Year: Never true  Transportation Needs: No Transportation Needs   Lack of Transportation (Medical): No   Lack of Transportation (Non-Medical): No  Physical Activity: Not on file  Stress: No Stress Concern Present   Feeling of Stress : Not at all  Social Connections: Unknown  Frequency of Communication with Friends and Family: More than three times a week   Frequency of Social Gatherings with Friends and Family: Not on file   Attends Religious Services: Not on file   Active Member of Clubs or Organizations: Not on file   Attends Archivist Meetings: Not on file   Marital Status: Not on file     Review of Systems  Constitutional:  Positive for fatigue.       Reports is eating.   HENT:  Negative for congestion and sinus pressure.   Respiratory:  Negative for cough and chest tightness.        Breathing stable.   Cardiovascular:  Positive for palpitations. Negative for leg swelling.  Gastrointestinal:  Negative for abdominal pain, diarrhea and vomiting.       Occasional nausea.   Genitourinary:  Negative for difficulty urinating and dysuria.  Musculoskeletal:  Negative for joint swelling and myalgias.  Skin:  Negative for color change and rash.  Neurological:  Negative for dizziness and headaches.  Psychiatric/Behavioral:  Negative for agitation and dysphoric mood.       Objective:     BP (!) 96/57 (BP Location: Left Arm, Patient Position:  Sitting, Cuff Size: Small)   Pulse (!) 50   Temp (!) 97.5 F (36.4 C) (Temporal)   Resp 14   Ht '5\' 2"'$  (1.575 m)   Wt 95 lb 12.8 oz (43.5 kg)   SpO2 97%   BMI 17.52 kg/m  Wt Readings from Last 3 Encounters:  06/23/21 95 lb 12.8 oz (43.5 kg)  06/07/21 92 lb (41.7 kg)  05/28/21 92 lb 8 oz (42 kg)    Physical Exam Vitals reviewed.  Constitutional:      General: She is not in acute distress.    Appearance: Normal appearance.  HENT:     Head: Normocephalic and atraumatic.     Right Ear: External ear normal.     Left Ear: External ear normal.  Eyes:     General: No scleral icterus.       Right eye: No discharge.        Left eye: No discharge.     Conjunctiva/sclera: Conjunctivae normal.  Neck:     Thyroid: No thyromegaly.  Cardiovascular:     Rate and Rhythm: Normal rate and regular rhythm.  Pulmonary:     Effort: No respiratory distress.     Breath sounds: Normal breath sounds. No wheezing.  Abdominal:     General: Bowel sounds are normal.     Palpations: Abdomen is soft.     Tenderness: There is no abdominal tenderness.  Musculoskeletal:        General: No swelling or tenderness.     Cervical back: Neck supple. No tenderness.  Lymphadenopathy:     Cervical: No cervical adenopathy.  Skin:    Findings: No erythema or rash.  Neurological:     Mental Status: She is alert.  Psychiatric:        Mood and Affect: Mood normal.        Behavior: Behavior normal.     Outpatient Encounter Medications as of 06/23/2021  Medication Sig   amLODipine (NORVASC) 5 MG tablet Take 1 tablet (5 mg total) by mouth daily. Take for SBP greater than 160   ASPIRIN ADULT LOW DOSE PO Take 1 tablet by mouth daily.   calcium carbonate (TUMS - DOSED IN MG ELEMENTAL CALCIUM) 500 MG chewable tablet Chew 1 tablet by mouth 3 (three) times  daily as needed.    Cyanocobalamin (VITAMIN B 12 PO) Take 100 mcg by mouth daily.   donepezil (ARICEPT) 5 MG tablet DO NOT FILL. THIS PRESCRIPTION WAS ON HOLD AT  THE PREVIOUS PHARMACY, PLEASE CONTACT THE PRESCRIBER TO OBTAIN A NEW PRESCRIPTION.   nitroGLYCERIN (NITROSTAT) 0.4 MG SL tablet Place 1 tablet (0.4 mg total) under the tongue every 5 (five) minutes as needed for chest pain.   propranolol (INDERAL) 10 MG tablet TAKE 1 TABLET (10 MG TOTAL) BY MOUTH 2 (TWO) TIMES DAILY AS NEEDED (AS NEEDED FOR PALPITATIONS).   sertraline (ZOLOFT) 50 MG tablet TAKE 1 TABLET BY MOUTH EVERY DAY   No facility-administered encounter medications on file as of 06/23/2021.     Lab Results  Component Value Date   WBC 6.5 04/13/2021   HGB 12.0 04/13/2021   HCT 38.1 04/13/2021   PLT 180 04/13/2021   GLUCOSE 100 (H) 04/13/2021   CHOL 227 (H) 11/06/2020   TRIG 114.0 11/06/2020   HDL 74.30 11/06/2020   LDLCALC 130 (H) 11/06/2020   ALT 12 04/13/2021   AST 25 04/13/2021   NA 139 04/13/2021   K 4.3 04/13/2021   CL 104 04/13/2021   CREATININE 1.13 (H) 04/13/2021   BUN 29 (H) 04/13/2021   CO2 30 04/13/2021   TSH 1.94 01/28/2021   HGBA1C 5.4 10/29/2018    DG Chest Port 1 View  Result Date: 04/13/2021 CLINICAL DATA:  Chest pain EXAM: PORTABLE CHEST 1 VIEW COMPARISON:  04/11/2021 FINDINGS: Lungs are clear.  No pleural effusion or pneumothorax. The heart is normal in size.  Thoracic aortic atherosclerosis. IMPRESSION: No evidence of acute cardiopulmonary disease. Electronically Signed   By: Julian Hy M.D.   On: 04/13/2021 22:02       Assessment & Plan:   Problem List Items Addressed This Visit     Anxiety    On zoloft.  Follow.         Cerebral atrophy ([place])    Noted on previous CT scan.  On aricept.  Follow.        History of CVA (cerebrovascular accident)    Off plavix.  ECASA '81mg'$  q day.  Follow.         Hypercholesterolemia    Off pravastatin.  Age 88.  Hold on starting new medication at this time.         Hypertension    Has been on amlodipine.  Blood pressure as outlined.  Follow pressures.  Follow metabolic panel. Per cardiology -  taking prn.        Iron deficiency anemia    Has seen hematology previously - required iron infusion.  Check cbc and iron studies. hgb wnl on recent check.        Palpitations    When notices palpitations, cardio mobile - reads afib.  Has seen cardiology and discussed palpitations.  zio monitor unsuccessful. She pulled it off.  Has propranolol to take prn.  D/w cardiology regarding f/u.         Weight loss    Weight increased a few pounds from recent check.  Follow.          Einar Pheasant, MD

## 2021-06-27 ENCOUNTER — Encounter: Payer: Self-pay | Admitting: Internal Medicine

## 2021-06-27 DIAGNOSIS — R002 Palpitations: Secondary | ICD-10-CM | POA: Insufficient documentation

## 2021-06-27 NOTE — Assessment & Plan Note (Signed)
When notices palpitations, cardio mobile - reads afib.  Has seen cardiology and discussed palpitations.  zio monitor unsuccessful. She pulled it off.  Has propranolol to take prn.  D/w cardiology regarding f/u.

## 2021-06-27 NOTE — Assessment & Plan Note (Signed)
On zoloft.  Follow.  

## 2021-06-27 NOTE — Assessment & Plan Note (Signed)
Weight increased a few pounds from recent check.  Follow.

## 2021-06-27 NOTE — Assessment & Plan Note (Signed)
Noted on previous CT scan.  On aricept.  Follow.  

## 2021-06-27 NOTE — Assessment & Plan Note (Addendum)
Has been on amlodipine.  Blood pressure as outlined.  Follow pressures.  Follow metabolic panel. Per cardiology - taking prn.

## 2021-06-27 NOTE — Assessment & Plan Note (Signed)
Off plavix.  ECASA '81mg'$  q day.  Follow.

## 2021-06-27 NOTE — Assessment & Plan Note (Signed)
Off pravastatin.  Age 88.  Hold on starting new medication at this time.   

## 2021-06-27 NOTE — Assessment & Plan Note (Signed)
Has seen hematology previously - required iron infusion.  Check cbc and iron studies. hgb wnl on recent check.  

## 2021-06-28 ENCOUNTER — Telehealth: Payer: Self-pay | Admitting: Internal Medicine

## 2021-06-28 NOTE — Telephone Encounter (Signed)
Patients son called, he needs another handicapp renewal form completed. He messed the other form up and DM[employer] would not take it.When complete call him at [phone].

## 2021-06-30 NOTE — Telephone Encounter (Signed)
Legrand Como informed- will p/u

## 2021-07-16 ENCOUNTER — Ambulatory Visit: Payer: Medicare Other | Admitting: Nurse Practitioner

## 2021-07-23 ENCOUNTER — Other Ambulatory Visit: Payer: Self-pay

## 2021-07-23 ENCOUNTER — Telehealth: Payer: Self-pay

## 2021-07-23 MED ORDER — DONEPEZIL HCL 5 MG PO TABS: ORAL_TABLET | ORAL | 1 refills | Status: DC

## 2021-07-23 NOTE — Telephone Encounter (Signed)
Patient's son, Ambria Mayfield, states patient's prescription for  donepezil (ARICEPT) 5 MG tablet was sent to [facility] on [address] in [place], but it needs to go to [facility] on [address] in [place].

## 2021-07-23 NOTE — Telephone Encounter (Signed)
sent 

## 2021-07-24 MED ORDER — DONEPEZIL HCL 5 MG PO TABS
5.0000 mg | ORAL_TABLET | Freq: Every day | ORAL | 1 refills | Status: DC
Start: 2021-07-24 — End: 2021-11-04

## 2021-07-24 NOTE — Addendum Note (Signed)
Addended by: Alisa Graff on: 07/24/2021 08:05 AM   Modules accepted: Orders

## 2021-07-24 NOTE — Telephone Encounter (Signed)
Rx ok'd for aricept #90 with one refill.

## 2021-08-13 ENCOUNTER — Ambulatory Visit: Payer: Medicare Other | Admitting: Internal Medicine

## 2021-08-17 ENCOUNTER — Ambulatory Visit: Payer: Medicare Other | Admitting: Internal Medicine

## 2021-08-17 ENCOUNTER — Ambulatory Visit (INDEPENDENT_AMBULATORY_CARE_PROVIDER_SITE_OTHER): Payer: Medicare Other | Admitting: Family Medicine

## 2021-08-17 ENCOUNTER — Ambulatory Visit (INDEPENDENT_AMBULATORY_CARE_PROVIDER_SITE_OTHER): Payer: Medicare Other

## 2021-08-17 ENCOUNTER — Encounter: Payer: Self-pay | Admitting: Family Medicine

## 2021-08-17 VITALS — BP 126/78 | HR 82 | Temp 97.8°F | Ht 62.0 in | Wt 112.5 lb

## 2021-08-17 DIAGNOSIS — S81812A Laceration without foreign body, left lower leg, initial encounter: Secondary | ICD-10-CM

## 2021-08-17 DIAGNOSIS — R350 Frequency of micturition: Secondary | ICD-10-CM | POA: Diagnosis not present

## 2021-08-17 DIAGNOSIS — W19XXXA Unspecified fall, initial encounter: Secondary | ICD-10-CM

## 2021-08-17 DIAGNOSIS — Z111 Encounter for screening for respiratory tuberculosis: Secondary | ICD-10-CM | POA: Diagnosis not present

## 2021-08-17 DIAGNOSIS — R079 Chest pain, unspecified: Secondary | ICD-10-CM | POA: Diagnosis not present

## 2021-08-17 DIAGNOSIS — R296 Repeated falls: Secondary | ICD-10-CM | POA: Diagnosis not present

## 2021-08-17 LAB — POCT URINALYSIS DIPSTICK
Glucose, UA: NEGATIVE
Ketones, UA: NEGATIVE
Nitrite, UA: NEGATIVE
Odor: NEGATIVE
Protein, UA: NEGATIVE
Spec Grav, UA: 1.025 (ref 1.010–1.025)
Urobilinogen, UA: 1 E.U./dL
pH, UA: 5.5 (ref 5.0–8.0)

## 2021-08-17 MED ORDER — CEPHALEXIN 250 MG PO CAPS
250.0000 mg | ORAL_CAPSULE | Freq: Four times a day (QID) | ORAL | 0 refills | Status: AC
Start: 2021-08-17 — End: 2021-08-22

## 2021-08-17 NOTE — Patient Instructions (Addendum)
It was a pleasure meeting you today. Thank you for allowing me to take part in your health care.  Our goals for today as we discussed include:  For your urinary frequency Start Keflex 250 mg 4 times a day.  Take with breakfast, lunch, dinner and before bedtime.  When I receive the urine culture if I need to make any changes I will call you.  Please monitor any changes in mental status.  Should you notice any please go to the emergency department as soon as possible.  I will call you with the results of your chest image if it is abnormal.  For pain you can take Tylenol 500 mg 3 times a day.  If you have any shortness of breath or chest pain please go to the emergency department.  Follow-up in 48 hours to have your TB skin test read.  With PCP in 1 week  If you have any questions or concerns, please do not hesitate to call the office at [phone].  I look forward to our next visit and until then take care and stay safe.  Regards,   Carollee Leitz, MD   [facility]

## 2021-08-17 NOTE — Progress Notes (Signed)
    SUBJECTIVE:   CHIEF COMPLAINT / HPI: Urinary frequency, unwitnessed fall  Patient presents to clinic with daughter-in-law for urinary frequency.  Daughter-in-law reports noticed that Ms. Behrle was going to the bathroom more often.  Denies any fevers, painful urination, hematuria or abdominal pain.  Falls Daughter-in-law reports Ms. Zobrist fell last week.  No LOC at time of injury.  Hit her right side.  Has large hematoma to right chest area but seems to be resolving.  Denies any chest pain, difficulty breathing, hemoptysis, shortness of breath, weakness, dizziness.  Reports fall last week was witnessed by patient's son.  No head injury at the time.  Daughter-in-law reports that today Ms. Vidaurri had an unwitnessed fall and has been complaining of pain in her left knee.  Unknown if any head injury.  Currently on ASA only.  TB testing Requesting PPD testing for adult daycare centre and form completion.  PERTINENT  PMH / PSH:  Cognitive impairment History of falls   OBJECTIVE:   BP 126/78 (BP Location: Left Arm, Patient Position: Sitting, Cuff Size: Small)   Pulse 82   Temp 97.8 F (36.6 C) (Oral)   Ht '5\' 2"'$  (1.575 m)   Wt 112 lb 8 oz (51 kg)   SpO2 98%   BMI 20.58 kg/m    General: Alert, no acute distress Head: Normocephalic, no lacerations, ecchymosis or edema appreciated. Cardio: Normal S1 and S2, RRR, no r/m/g Pulm: CTAB, normal work of breathing, no flail chest, tracheal deviation.  Healing ecchymosis noted to right fifth through seventh rib area. Bilateral knee exam: No redness or swelling noted. Good passive/active range of motion of knee without pain.  No tenderness at medial or lateral joint line.  Unable to appreciate any joint effusion.  Anterior/posterior drawer tests negative Derm:Skin tear to anteriolateral lower left extremity. Neuro: Mentation at baseline per daughter-in-law   ASSESSMENT/PLAN:   Urinary frequency Urine positive for trace leukocytes.  Given  mild cognitive impairment will treat with antibiotics today. Send urine for culture Keflex 250 mg 4 times daily x5 days if culture negative can discontinue antibiotics. Follow-up with PCP as needed.    Fall History of multiple falls resulting in hematoma right thorax.  Most recent fall unwitnessed.  Cannot confirm if no head injury or LOC of incident.  Daughter-in-law reports mentation at baseline.  Patient currently on ASA.  Given decreased cognition recommend imaging today.  Discussed with family need for stat CT head, daughter-in-law politely declined.  Advised that if any worsening of mentation from baseline would recommend ED evaluation. Chest x-ray rule out rib fracture Follow-up with PCP in 1 week, could consider palliative consult at that time  Skin tear of lower leg without complication, left, initial encounter Unwitnessed fall this morning.  Had been out running errands earlier this morning.  Not currently bleeding or having drainage.  No signs of infection. Recommend nonadherent dressing Tylenol for discomfort. Follow-up with PCP 1 week, if no improvement recommend wound care consult. Strict return precautions provided.      Carollee Leitz, MD [place]

## 2021-08-18 LAB — URINE CULTURE
MICRO NUMBER:: 13691529
Result:: NO GROWTH
SPECIMEN QUALITY:: ADEQUATE

## 2021-08-19 ENCOUNTER — Encounter: Payer: Self-pay | Admitting: Internal Medicine

## 2021-08-20 ENCOUNTER — Ambulatory Visit: Payer: Medicare Other

## 2021-08-20 LAB — TB SKIN TEST
Induration: 0 mm
TB Skin Test: NEGATIVE

## 2021-08-20 NOTE — Progress Notes (Signed)
Patient presented for PPD read . Results were negative. Patient was aware

## 2021-08-22 ENCOUNTER — Encounter: Payer: Self-pay | Admitting: Family Medicine

## 2021-08-23 ENCOUNTER — Ambulatory Visit (INDEPENDENT_AMBULATORY_CARE_PROVIDER_SITE_OTHER): Payer: Medicare Other | Admitting: Internal Medicine

## 2021-08-23 ENCOUNTER — Telehealth: Payer: Self-pay

## 2021-08-23 ENCOUNTER — Encounter: Payer: Self-pay | Admitting: Internal Medicine

## 2021-08-23 ENCOUNTER — Encounter: Payer: Self-pay | Admitting: Family Medicine

## 2021-08-23 DIAGNOSIS — R413 Other amnesia: Secondary | ICD-10-CM | POA: Diagnosis not present

## 2021-08-23 DIAGNOSIS — W19XXXD Unspecified fall, subsequent encounter: Secondary | ICD-10-CM

## 2021-08-23 DIAGNOSIS — Z8673 Personal history of transient ischemic attack (TIA), and cerebral infarction without residual deficits: Secondary | ICD-10-CM | POA: Diagnosis not present

## 2021-08-23 DIAGNOSIS — E7800 Pure hypercholesterolemia, unspecified: Secondary | ICD-10-CM | POA: Diagnosis not present

## 2021-08-23 DIAGNOSIS — D509 Iron deficiency anemia, unspecified: Secondary | ICD-10-CM

## 2021-08-23 DIAGNOSIS — R634 Abnormal weight loss: Secondary | ICD-10-CM

## 2021-08-23 DIAGNOSIS — S81812A Laceration without foreign body, left lower leg, initial encounter: Secondary | ICD-10-CM | POA: Insufficient documentation

## 2021-08-23 DIAGNOSIS — G319 Degenerative disease of nervous system, unspecified: Secondary | ICD-10-CM

## 2021-08-23 DIAGNOSIS — I10 Essential (primary) hypertension: Secondary | ICD-10-CM | POA: Diagnosis not present

## 2021-08-23 DIAGNOSIS — R350 Frequency of micturition: Secondary | ICD-10-CM | POA: Insufficient documentation

## 2021-08-23 NOTE — Assessment & Plan Note (Addendum)
Urine positive for trace leukocytes.  Given mild cognitive impairment will treat with antibiotics today. Send urine for culture Keflex 250 mg 4 times daily x5 days if culture negative can discontinue antibiotics. Follow-up with PCP as needed.

## 2021-08-23 NOTE — Telephone Encounter (Signed)
-----   Message from Carollee Leitz, MD sent at 08/23/2021 12:25 PM EDT ----- Please let patient know that chest xray was normal and did not show any rib fractures.  Thank you Carollee Leitz, MD

## 2021-08-23 NOTE — Telephone Encounter (Signed)
Patient's son returned office phone call. Message was read. Patient has an appointment with Dr Nicki Reaper today.

## 2021-08-23 NOTE — Progress Notes (Unsigned)
LMTCB for chest xray results

## 2021-08-23 NOTE — Assessment & Plan Note (Signed)
Unwitnessed fall this morning.  Had been out running errands earlier this morning.  Not currently bleeding or having drainage.  No signs of infection. Recommend nonadherent dressing Tylenol for discomfort. Follow-up with PCP 1 week, if no improvement recommend wound care consult. Strict return precautions provided.

## 2021-08-23 NOTE — Telephone Encounter (Signed)
LMTCB for chest xray results

## 2021-08-23 NOTE — Assessment & Plan Note (Addendum)
History of multiple falls resulting in hematoma right thorax.  Most recent fall unwitnessed.  Cannot confirm if no head injury or LOC of incident.  Daughter-in-law reports mentation at baseline.  Patient currently on ASA.  Given decreased cognition recommend imaging today.  Discussed with family need for stat CT head, daughter-in-law politely declined.  Advised that if any worsening of mentation from baseline would recommend ED evaluation. Chest x-ray rule out rib fracture Follow-up with PCP in 1 week, could consider palliative consult at that time

## 2021-08-23 NOTE — Progress Notes (Signed)
Patient ID: Rachel Martinez, female   DOB: 20-Aug-1927, 88 y.o.   MRN: [id number]   Subjective:    Patient ID: Rachel Martinez, female    DOB: 07-15-1927, 88 y.o.   MRN: [id number]   Patient here for scheduled follow up .   HPI Here to follow up regarding recent acute visit for fall and confusion.  She is accompanied by her son.  History obtained from both of them.  Recently evaluated - increased urinary frequency.  Also two recent falls.  No LOC.  Right chest hematoma - resolving.  Still with some pain, but has improved.  No pain with deep breathing.  Second fall was an unwitnessed fall.  Unsure if head injury.  Declined head scan.  Denies headache.  No dizziness reported.  No chest pain.  Breathing stable.  Eating.  No vomiting.  Interested in her attending an adult daycare for increased social stimulation.  Form has been completed.  Still with memory issues, but overall things are fairly stable.     Past Medical History:  Diagnosis Date   Anemia    Chicken pox    CVA (cerebral vascular accident) ([place]) 8/06   Depression    GERD (gastroesophageal reflux disease)    Heart palpitations    Hypercholesteremia    Hypertension    Stroke [facility])    Past Surgical History:  Procedure Laterality Date   APPENDECTOMY     Family History  Problem Relation Age of Onset   CVA Mother    Diabetes Father    Heart disease Father    Cancer Sister        breast    Diabetes Sister    Cancer Brother        stomach   Leukemia Brother    Lung disease Brother    Hypertension Sister    Cancer Sister        stomach   Diabetes Sister    Mental illness Brother        suicide   Breast cancer Paternal Aunt    Diabetes Son    Social History   Socioeconomic History   Marital status: Widowed    Spouse name: Not on file   Number of children: Not on file   Years of education: Not on file   Highest education level: Not on file  Occupational History   Not on file  Tobacco Use   Smoking status: Former     Packs/day: 0.25    Years: 30.00    Total pack years: 7.50    Types: Cigarettes    Quit date: 09/13/2003    Years since quitting: 17.9   Smokeless tobacco: Never  Vaping Use   Vaping Use: Never used  Substance and Sexual Activity   Alcohol use: No    Alcohol/week: 0.0 standard drinks of alcohol   Drug use: No   Sexual activity: Never  Other Topics Concern   Not on file  Social History Narrative   Not on file   Social Determinants of Health   Financial Resource Strain: Low Risk  (06/07/2021)   Overall Financial Resource Strain (CARDIA)    Difficulty of Paying Living Expenses: Not hard at all  Food Insecurity: No Food Insecurity (06/07/2021)   Hunger Vital Sign    Worried About Running Out of Food in the Last Year: Never true    [place] in the Last Year: Never true  Transportation Needs: No Transportation Needs (06/07/2021)  PRAPARE - Hydrologist (Medical): No    Lack of Transportation (Non-Medical): No  Physical Activity: Insufficiently Active (05/29/2018)   Exercise Vital Sign    Days of Exercise per Week: 2 days    Minutes of Exercise per Session: 10 min  Stress: No Stress Concern Present (06/07/2021)   [place]    Feeling of Stress : Not at all  Social Connections: Unknown (06/07/2021)   Social Connection and Isolation Panel [NHANES]    Frequency of Communication with Friends and Family: More than three times a week    Frequency of Social Gatherings with Friends and Family: Not on file    Attends Religious Services: Not on file    Active Member of Clubs or Organizations: Not on file    Attends Archivist Meetings: Not on file    Marital Status: Not on file     Review of Systems  Constitutional:  Negative for appetite change and unexpected weight change.  HENT:  Negative for congestion and sinus pressure.   Respiratory:  Negative for cough, chest  tightness and shortness of breath.   Cardiovascular:  Negative for chest pain, palpitations and leg swelling.  Gastrointestinal:  Negative for abdominal pain, diarrhea, nausea and vomiting.  Genitourinary:  Negative for difficulty urinating and dysuria.  Musculoskeletal:  Negative for joint swelling and myalgias.  Skin:  Negative for color change and rash.  Neurological:  Negative for dizziness, light-headedness and headaches.  Psychiatric/Behavioral:  Negative for agitation and dysphoric mood.        Objective:     BP 101/68 (BP Location: Left Arm, Patient Position: Sitting, Cuff Size: Small)   Pulse 62   Temp 98.4 F (36.9 C) (Temporal)   Resp 17   Ht '5\' 2"'$  (1.575 m)   Wt 111 lb 11.2 oz (50.7 kg)   SpO2 98%   BMI 20.43 kg/m  Wt Readings from Last 3 Encounters:  08/23/21 111 lb 11.2 oz (50.7 kg)  08/17/21 112 lb 8 oz (51 kg)  06/23/21 95 lb 12.8 oz (43.5 kg)    Physical Exam Vitals reviewed.  Constitutional:      General: She is not in acute distress.    Appearance: Normal appearance.  HENT:     Head: Normocephalic and atraumatic.     Right Ear: External ear normal.     Left Ear: External ear normal.  Eyes:     General: No scleral icterus.       Right eye: No discharge.        Left eye: No discharge.     Conjunctiva/sclera: Conjunctivae normal.  Neck:     Thyroid: No thyromegaly.  Cardiovascular:     Rate and Rhythm: Normal rate and regular rhythm.  Pulmonary:     Effort: No respiratory distress.     Breath sounds: Normal breath sounds. No wheezing.  Abdominal:     General: Bowel sounds are normal.     Palpations: Abdomen is soft.     Tenderness: There is no abdominal tenderness.  Musculoskeletal:        General: No swelling or tenderness.     Cervical back: Neck supple. No tenderness.  Lymphadenopathy:     Cervical: No cervical adenopathy.  Skin:    Findings: No erythema or rash.  Neurological:     Mental Status: She is alert.  Psychiatric:         Mood and Affect:  Mood normal.        Behavior: Behavior normal.      Outpatient Encounter Medications as of 08/23/2021  Medication Sig   amLODipine (NORVASC) 5 MG tablet Take 1 tablet (5 mg total) by mouth daily. Take for SBP greater than 160   ASPIRIN ADULT LOW DOSE PO Take 1 tablet by mouth daily.   calcium carbonate (TUMS - DOSED IN MG ELEMENTAL CALCIUM) 500 MG chewable tablet Chew 1 tablet by mouth 3 (three) times daily as needed.    Cyanocobalamin (VITAMIN B 12 PO) Take 100 mcg by mouth daily.   donepezil (ARICEPT) 5 MG tablet Take 1 tablet (5 mg total) by mouth at bedtime.   nitroGLYCERIN (NITROSTAT) 0.4 MG SL tablet Place 1 tablet (0.4 mg total) under the tongue every 5 (five) minutes as needed for chest pain.   propranolol (INDERAL) 10 MG tablet TAKE 1 TABLET (10 MG TOTAL) BY MOUTH 2 (TWO) TIMES DAILY AS NEEDED (AS NEEDED FOR PALPITATIONS).   sertraline (ZOLOFT) 50 MG tablet TAKE 1 TABLET BY MOUTH EVERY DAY   No facility-administered encounter medications on file as of 08/23/2021.     Lab Results  Component Value Date   WBC 6.5 04/13/2021   HGB 12.0 04/13/2021   HCT 38.1 04/13/2021   PLT 180 04/13/2021   GLUCOSE 100 (H) 04/13/2021   CHOL 227 (H) 11/06/2020   TRIG 114.0 11/06/2020   HDL 74.30 11/06/2020   LDLCALC 130 (H) 11/06/2020   ALT 12 04/13/2021   AST 25 04/13/2021   NA 139 04/13/2021   K 4.3 04/13/2021   CL 104 04/13/2021   CREATININE 1.13 (H) 04/13/2021   BUN 29 (H) 04/13/2021   CO2 30 04/13/2021   TSH 1.94 01/28/2021   HGBA1C 5.4 10/29/2018    DG Chest Port 1 View  Result Date: 04/13/2021 CLINICAL DATA:  Chest pain EXAM: PORTABLE CHEST 1 VIEW COMPARISON:  04/11/2021 FINDINGS: Lungs are clear.  No pleural effusion or pneumothorax. The heart is normal in size.  Thoracic aortic atherosclerosis. IMPRESSION: No evidence of acute cardiopulmonary disease. Electronically Signed   By: Julian Hy M.D.   On: 04/13/2021 22:02       Assessment & Plan:    Problem List Items Addressed This Visit     Cerebral atrophy [facility])    Noted on previous CT scan.  On aricept.  Follow.       Fall    Recent falls as outlined.  Chest hematoma (right side chest wall) - resolving.  No pain with deep breathing.  Continue deep breaths.  cxr - ok.  Discussed f/u rib films.  Wants to monitor.  No headache.  No dizziness.  Discussed therapy to help with balance , gait.  Notify if agreeable.        History of CVA (cerebrovascular accident)    ECASA '81mg'$  q day.  Follow.  Off plavix.       Hypercholesterolemia    Off pravastatin.  Age 8.  Hold on starting new medication at this time.        Hypertension    Blood pressure on check by me:  118/78.  Continue amlodipine.  Follow pressures.  Follow metabolic panel.       Iron deficiency anemia    Has seen hematology previously - required iron infusion.  Check cbc and iron studies. hgb wnl on recent check.       Memory change    Continue aricept.  Follow  Skin tear of lower leg without complication, left, initial encounter    Healing.  Follow.  No evidence of infection.        Weight loss    Weight has improved from May check.  Son reports eating better.  Follow.         Einar Pheasant, MD

## 2021-08-23 NOTE — Telephone Encounter (Signed)
LMTCB to let the Patient know that the chest xray was normal and did not show any fractures.

## 2021-08-29 NOTE — Assessment & Plan Note (Signed)
Blood pressure on check by me:  118/78.  Continue amlodipine.  Follow pressures.  Follow metabolic panel.

## 2021-08-29 NOTE — Assessment & Plan Note (Signed)
Noted on previous CT scan.  On aricept.  Follow.

## 2021-08-29 NOTE — Assessment & Plan Note (Signed)
Healing.  Follow.  No evidence of infection.

## 2021-08-29 NOTE — Assessment & Plan Note (Signed)
Has seen hematology previously - required iron infusion.  Check cbc and iron studies. hgb wnl on recent check.

## 2021-08-29 NOTE — Assessment & Plan Note (Signed)
ECASA 81mg q day.  Follow.  Off plavix.  

## 2021-08-29 NOTE — Assessment & Plan Note (Signed)
Recent falls as outlined.  Chest hematoma (right side chest wall) - resolving.  No pain with deep breathing.  Continue deep breaths.  cxr - ok.  Discussed f/u rib films.  Wants to monitor.  No headache.  No dizziness.  Discussed therapy to help with balance , gait.  Notify if agreeable.

## 2021-08-29 NOTE — Assessment & Plan Note (Signed)
Continue aricept.  Follow

## 2021-08-29 NOTE — Assessment & Plan Note (Signed)
Off pravastatin.  Age 88.  Hold on starting new medication at this time.   

## 2021-08-29 NOTE — Assessment & Plan Note (Signed)
Weight has improved from May check.  Son reports eating better.  Follow.

## 2021-10-13 DIAGNOSIS — M25511 Pain in right shoulder: Secondary | ICD-10-CM | POA: Diagnosis not present

## 2021-10-13 DIAGNOSIS — G8929 Other chronic pain: Secondary | ICD-10-CM | POA: Diagnosis not present

## 2021-10-13 DIAGNOSIS — M7581 Other shoulder lesions, right shoulder: Secondary | ICD-10-CM | POA: Diagnosis not present

## 2021-10-15 ENCOUNTER — Other Ambulatory Visit: Payer: Self-pay | Admitting: Internal Medicine

## 2021-11-01 ENCOUNTER — Ambulatory Visit: Payer: Medicare Other | Admitting: Internal Medicine

## 2021-11-04 ENCOUNTER — Telehealth: Payer: Self-pay

## 2021-11-04 ENCOUNTER — Other Ambulatory Visit: Payer: Self-pay

## 2021-11-04 MED ORDER — PROPRANOLOL HCL 10 MG PO TABS: 10.0000 mg | ORAL_TABLET | Freq: Two times a day (BID) | ORAL | 2 refills | Status: DC | PRN

## 2021-11-04 MED ORDER — SERTRALINE HCL 50 MG PO TABS: 50.0000 mg | ORAL_TABLET | Freq: Every day | ORAL | 0 refills | Status: DC

## 2021-11-04 MED ORDER — AMLODIPINE BESYLATE 5 MG PO TABS: 5.0000 mg | ORAL_TABLET | Freq: Every day | ORAL | 2 refills | Status: DC

## 2021-11-04 MED ORDER — DONEPEZIL HCL 5 MG PO TABS: 5.0000 mg | ORAL_TABLET | Freq: Every day | ORAL | 1 refills | Status: DC

## 2021-11-04 NOTE — Telephone Encounter (Signed)
I called and spoke with patient's DIL Langley Gauss & explained that this was an issue that would really need tro be taken [facility] with [facility]. I did let her know that we could send to [facility] in [place] instead but they may have tro pay out of pocket. She was okay with this & asked if I would just send all script to [facility]. I have sent for patient.

## 2021-11-04 NOTE — Telephone Encounter (Signed)
Patient's daughter-in-law, Trystyn Sitts, called to state they went to pick up patient's sertraline (ZOLOFT) 50 MG tablet, and the pharmacy gave them nitroGLYCERIN (NITROSTAT) 0.4 MG SL tablet (Expired).  Langley Gauss states [facility] [place] and they stated that they had already given patient a 90-day supply of the  sertraline (ZOLOFT) 50 MG tablet.  Langley Gauss states they did not give patient 90-day supply of the  sertraline (ZOLOFT) 50 MG tablet and patient has now been two weeks without this medication.  Langley Gauss states patient is becoming irritable and anxious, even cursing.  Langley Gauss states she would like to change patient's pharmacy to [facility] in [place] would like to know if we can send a refill for the sertraline (ZOLOFT) 50 MG tablet to this pharmacy.  Langley Gauss states that Cinda Quest, patient's son, will be picking medication up from the pharmacy.

## 2021-11-06 ENCOUNTER — Other Ambulatory Visit: Payer: Self-pay | Admitting: Internal Medicine

## 2021-11-11 ENCOUNTER — Encounter: Payer: Self-pay | Admitting: Internal Medicine

## 2021-11-11 ENCOUNTER — Ambulatory Visit (INDEPENDENT_AMBULATORY_CARE_PROVIDER_SITE_OTHER): Payer: Medicare Other | Admitting: Internal Medicine

## 2021-11-11 VITALS — BP 122/62 | HR 67 | Temp 98.1°F | Ht 62.0 in | Wt 120.2 lb

## 2021-11-11 DIAGNOSIS — D509 Iron deficiency anemia, unspecified: Secondary | ICD-10-CM

## 2021-11-11 DIAGNOSIS — I10 Essential (primary) hypertension: Secondary | ICD-10-CM

## 2021-11-11 DIAGNOSIS — G319 Degenerative disease of nervous system, unspecified: Secondary | ICD-10-CM

## 2021-11-11 DIAGNOSIS — E7800 Pure hypercholesterolemia, unspecified: Secondary | ICD-10-CM | POA: Diagnosis not present

## 2021-11-11 DIAGNOSIS — R296 Repeated falls: Secondary | ICD-10-CM

## 2021-11-11 DIAGNOSIS — M25511 Pain in right shoulder: Secondary | ICD-10-CM

## 2021-11-11 DIAGNOSIS — F439 Reaction to severe stress, unspecified: Secondary | ICD-10-CM | POA: Diagnosis not present

## 2021-11-11 NOTE — Progress Notes (Signed)
Patient ID: Rachel Martinez, female   DOB: May 04, 1927, 88 y.o.   MRN: [id number]   Subjective:    Patient ID: Rachel Martinez, female    DOB: 1927-06-14, 88 y.o.   MRN: [id number]    Patient here for  Chief Complaint  Patient presents with   Follow-up    10 WEEK F/U   .   HPI Here to follow up regarding her blood pressure, cholesterol and memory.  Recently evaluated by ortho - shoulder pain - s/p injection.  She is accompanied by her son.  History obtained from both of them.  Recurring falls.  Last three days ago.  No residual injury. Denies pain.  No head injury.  Discussed home health for PT and aid in home.  Agreeable.  Eating.  Weight is up.  No chest pain reported.  Breathing stable.  Some increased agitation recently.  Was out of zoloft.  Back on now and doing better.    Past Medical History:  Diagnosis Date   Anemia    Chicken pox    CVA (cerebral vascular accident) ([place]) 8/06   Depression    GERD (gastroesophageal reflux disease)    Heart palpitations    Hypercholesteremia    Hypertension    Stroke [facility])    Past Surgical History:  Procedure Laterality Date   APPENDECTOMY     Family History  Problem Relation Age of Onset   CVA Mother    Diabetes Father    Heart disease Father    Cancer Sister        breast    Diabetes Sister    Cancer Brother        stomach   Leukemia Brother    Lung disease Brother    Hypertension Sister    Cancer Sister        stomach   Diabetes Sister    Mental illness Brother        suicide   Breast cancer Paternal Aunt    Diabetes Son    Social History   Socioeconomic History   Marital status: Widowed    Spouse name: Not on file   Number of children: Not on file   Years of education: Not on file   Highest education level: Not on file  Occupational History   Not on file  Tobacco Use   Smoking status: Former    Packs/day: 0.25    Years: 30.00    Total pack years: 7.50    Types: Cigarettes    Quit date: 09/13/2003    Years  since quitting: 18.1   Smokeless tobacco: Never  Vaping Use   Vaping Use: Never used  Substance and Sexual Activity   Alcohol use: No    Alcohol/week: 0.0 standard drinks of alcohol   Drug use: No   Sexual activity: Never  Other Topics Concern   Not on file  Social History Narrative   Not on file   Social Determinants of Health   Financial Resource Strain: Low Risk  (06/07/2021)   Overall Financial Resource Strain (CARDIA)    Difficulty of Paying Living Expenses: Not hard at all  Food Insecurity: No Food Insecurity (06/07/2021)   Hunger Vital Sign    Worried About Running Out of Food in the Last Year: Never true    [place] in the Last Year: Never true  Transportation Needs: No Transportation Needs (06/07/2021)   PRAPARE - Hydrologist (Medical): No  Lack of Transportation (Non-Medical): No  Physical Activity: Insufficiently Active (05/29/2018)   Exercise Vital Sign    Days of Exercise per Week: 2 days    Minutes of Exercise per Session: 10 min  Stress: No Stress Concern Present (06/07/2021)   [place]    Feeling of Stress : Not at all  Social Connections: Unknown (06/07/2021)   Social Connection and Isolation Panel [NHANES]    Frequency of Communication with Friends and Family: More than three times a week    Frequency of Social Gatherings with Friends and Family: Not on file    Attends Religious Services: Not on file    Active Member of Clubs or Organizations: Not on file    Attends Archivist Meetings: Not on file    Marital Status: Not on file     Review of Systems  Constitutional:  Negative for appetite change and unexpected weight change.  HENT:  Negative for congestion and sinus pressure.   Respiratory:  Negative for cough, chest tightness and shortness of breath.   Cardiovascular:  Negative for chest pain, palpitations and leg swelling.   Gastrointestinal:  Negative for abdominal pain, nausea and vomiting.  Genitourinary:  Negative for difficulty urinating and dysuria.  Musculoskeletal:  Negative for joint swelling and myalgias.  Skin:  Negative for color change and rash.  Neurological:  Negative for dizziness and headaches.  Psychiatric/Behavioral:  Negative for agitation and dysphoric mood.        Objective:     BP 122/62 (BP Location: Left Arm, Patient Position: Sitting, Cuff Size: Normal)   Pulse 67   Temp 98.1 F (36.7 C) (Oral)   Ht '5\' 2"'$  (1.575 m)   Wt 120 lb 3.2 oz (54.5 kg)   SpO2 97%   BMI 21.98 kg/m  Wt Readings from Last 3 Encounters:  11/11/21 120 lb 3.2 oz (54.5 kg)  08/23/21 111 lb 11.2 oz (50.7 kg)  08/17/21 112 lb 8 oz (51 kg)    Physical Exam Vitals reviewed.  Constitutional:      General: She is not in acute distress.    Appearance: Normal appearance.  HENT:     Head: Normocephalic and atraumatic.     Right Ear: External ear normal.     Left Ear: External ear normal.  Eyes:     General: No scleral icterus.       Right eye: No discharge.        Left eye: No discharge.     Conjunctiva/sclera: Conjunctivae normal.  Neck:     Thyroid: No thyromegaly.  Cardiovascular:     Rate and Rhythm: Normal rate and regular rhythm.  Pulmonary:     Effort: No respiratory distress.     Breath sounds: Normal breath sounds. No wheezing.  Abdominal:     General: Bowel sounds are normal.     Palpations: Abdomen is soft.     Tenderness: There is no abdominal tenderness.  Musculoskeletal:        General: No swelling or tenderness.     Cervical back: Neck supple. No tenderness.  Lymphadenopathy:     Cervical: No cervical adenopathy.  Skin:    Findings: No erythema or rash.  Neurological:     Mental Status: She is alert.  Psychiatric:        Mood and Affect: Mood normal.        Behavior: Behavior normal.      Outpatient Encounter Medications as  of 11/11/2021  Medication Sig   calcium  carbonate (TUMS - DOSED IN MG ELEMENTAL CALCIUM) 500 MG chewable tablet Chew 1 tablet by mouth 3 (three) times daily as needed.    Cyanocobalamin (VITAMIN B 12 PO) Take 100 mcg by mouth daily.   propranolol (INDERAL) 10 MG tablet Take 1 tablet (10 mg total) by mouth 2 (two) times daily as needed (As needed for palpitations).   sertraline (ZOLOFT) 50 MG tablet TAKE 1 TABLET(50 MG) BY MOUTH DAILY   nitroGLYCERIN (NITROSTAT) 0.4 MG SL tablet Place 1 tablet (0.4 mg total) under the tongue every 5 (five) minutes as needed for chest pain.   [DISCONTINUED] amLODipine (NORVASC) 5 MG tablet Take 1 tablet (5 mg total) by mouth daily. Take for SBP greater than 160 (Patient not taking: Reported on 11/11/2021)   [DISCONTINUED] ASPIRIN ADULT LOW DOSE PO Take 1 tablet by mouth daily. (Patient not taking: Reported on 11/11/2021)   [DISCONTINUED] donepezil (ARICEPT) 5 MG tablet Take 1 tablet (5 mg total) by mouth at bedtime. (Patient not taking: Reported on 11/11/2021)   No facility-administered encounter medications on file as of 11/11/2021.     Lab Results  Component Value Date   WBC 8.0 11/11/2021   HGB 12.3 11/11/2021   HCT 36.6 11/11/2021   PLT 216.0 11/11/2021   GLUCOSE 75 11/11/2021   CHOL 227 (H) 11/06/2020   TRIG 114.0 11/06/2020   HDL 74.30 11/06/2020   LDLCALC 130 (H) 11/06/2020   ALT 11 11/11/2021   AST 22 11/11/2021   NA 142 11/11/2021   K 4.4 11/11/2021   CL 104 11/11/2021   CREATININE 1.09 11/11/2021   BUN 33 (H) 11/11/2021   CO2 25 11/11/2021   TSH 2.08 11/11/2021   HGBA1C 5.4 10/29/2018    DG Chest Port 1 View  Result Date: 04/13/2021 CLINICAL DATA:  Chest pain EXAM: PORTABLE CHEST 1 VIEW COMPARISON:  04/11/2021 FINDINGS: Lungs are clear.  No pleural effusion or pneumothorax. The heart is normal in size.  Thoracic aortic atherosclerosis. IMPRESSION: No evidence of acute cardiopulmonary disease. Electronically Signed   By: Julian Hy M.D.   On: 04/13/2021 22:02        Assessment & Plan:   Problem List Items Addressed This Visit     Cerebral atrophy [facility])    Noted on previous CT scan.  Was on aricept.  Per report, not taking.  Confirm.  Follow.        Frequent falls    Discussed recent fall.  No residual injuries.  Discussed home health evaluation - PT and aid.  Agreeable.        Relevant Orders   Ambulatory referral to Home Health   Hypercholesterolemia - Primary    Off pravastatin.  Age 89.  Hold on starting new medication at this time.        Relevant Orders   Comprehensive metabolic panel (Completed)   TSH (Completed)   Hypertension    Per report, not taking amlodipine.  Follow pressures.  If elevated, add back.       Iron deficiency anemia    Has seen hematology previously - required iron infusion.  Check cbc and iron studies.       Relevant Orders   CBC with Differential/Platelet (Completed)   Ferritin (Completed)   Right shoulder pain    Just saw ortho.  S/p injection.       Stress    Good family support.  Back on zoloft.  Doing better.  Follow.         Einar Pheasant, MD

## 2021-11-12 LAB — CBC WITH DIFFERENTIAL/PLATELET
Basophils Absolute: 0.1 10*3/uL (ref 0.0–0.1)
Basophils Relative: 0.8 % (ref 0.0–3.0)
Eosinophils Absolute: 0.2 10*3/uL (ref 0.0–0.7)
Eosinophils Relative: 3.1 % (ref 0.0–5.0)
HCT: 36.6 % (ref 36.0–46.0)
Hemoglobin: 12.3 g/dL (ref 12.0–15.0)
Lymphocytes Relative: 19.2 % (ref 12.0–46.0)
Lymphs Abs: 1.5 10*3/uL (ref 0.7–4.0)
MCHC: 33.7 g/dL (ref 30.0–36.0)
MCV: 93.9 fl (ref 78.0–100.0)
Monocytes Absolute: 0.7 10*3/uL (ref 0.1–1.0)
Monocytes Relative: 9 % (ref 3.0–12.0)
Neutro Abs: 5.5 10*3/uL (ref 1.4–7.7)
Neutrophils Relative %: 67.9 % (ref 43.0–77.0)
Platelets: 216 10*3/uL (ref 150.0–400.0)
RBC: 3.9 Mil/uL (ref 3.87–5.11)
RDW: 14.8 % (ref 11.5–15.5)
WBC: 8 10*3/uL (ref 4.0–10.5)

## 2021-11-12 LAB — COMPREHENSIVE METABOLIC PANEL
ALT: 11 U/L (ref 0–35)
AST: 22 U/L (ref 0–37)
Albumin: 4 g/dL (ref 3.5–5.2)
Alkaline Phosphatase: 125 U/L — ABNORMAL HIGH (ref 39–117)
BUN: 33 mg/dL — ABNORMAL HIGH (ref 6–23)
CO2: 25 mEq/L (ref 19–32)
Calcium: 9.2 mg/dL (ref 8.4–10.5)
Chloride: 104 mEq/L (ref 96–112)
Creatinine, Ser: 1.09 mg/dL (ref 0.40–1.20)
GFR: 44.05 mL/min — ABNORMAL LOW (ref >60.00)
Glucose, Bld: 75 mg/dL (ref 70–99)
Potassium: 4.4 mEq/L (ref 3.5–5.1)
Sodium: 142 mEq/L (ref 135–145)
Total Bilirubin: 0.4 mg/dL (ref 0.2–1.2)
Total Protein: 6.3 g/dL (ref 6.0–8.3)

## 2021-11-12 LAB — TSH: TSH: 2.08 u[IU]/mL (ref 0.35–5.50)

## 2021-11-12 LAB — FERRITIN: Ferritin: 32.8 ng/mL (ref 10.0–291.0)

## 2021-11-15 ENCOUNTER — Encounter: Payer: Self-pay | Admitting: Internal Medicine

## 2021-11-15 NOTE — Assessment & Plan Note (Signed)
Discussed recent fall.  No residual injuries.  Discussed home health evaluation - PT and aid.  Agreeable.

## 2021-11-15 NOTE — Assessment & Plan Note (Signed)
Off pravastatin.  Age 88.  Hold on starting new medication at this time.   

## 2021-11-15 NOTE — Assessment & Plan Note (Signed)
Has seen hematology previously - required iron infusion.  Check cbc and iron studies.  

## 2021-11-15 NOTE — Assessment & Plan Note (Signed)
Noted on previous CT scan.  Was on aricept.  Per report, not taking.  Confirm.  Follow.

## 2021-11-15 NOTE — Assessment & Plan Note (Signed)
Per report, not taking amlodipine.  Follow pressures.  If elevated, add back.

## 2021-11-15 NOTE — Assessment & Plan Note (Signed)
Just saw ortho.  S/p injection.

## 2021-11-15 NOTE — Assessment & Plan Note (Signed)
Good family support.  Back on zoloft.  Doing better.  Follow.

## 2021-11-17 DIAGNOSIS — K219 Gastro-esophageal reflux disease without esophagitis: Secondary | ICD-10-CM | POA: Diagnosis not present

## 2021-11-17 DIAGNOSIS — R002 Palpitations: Secondary | ICD-10-CM | POA: Diagnosis not present

## 2021-11-17 DIAGNOSIS — I700 Atherosclerosis of aorta: Secondary | ICD-10-CM | POA: Diagnosis not present

## 2021-11-17 DIAGNOSIS — Z87891 Personal history of nicotine dependence: Secondary | ICD-10-CM | POA: Diagnosis not present

## 2021-11-17 DIAGNOSIS — F32A Depression, unspecified: Secondary | ICD-10-CM | POA: Diagnosis not present

## 2021-11-17 DIAGNOSIS — I10 Essential (primary) hypertension: Secondary | ICD-10-CM | POA: Diagnosis not present

## 2021-11-17 DIAGNOSIS — D509 Iron deficiency anemia, unspecified: Secondary | ICD-10-CM | POA: Diagnosis not present

## 2021-11-17 DIAGNOSIS — G311 Senile degeneration of brain, not elsewhere classified: Secondary | ICD-10-CM | POA: Diagnosis not present

## 2021-11-17 DIAGNOSIS — R296 Repeated falls: Secondary | ICD-10-CM | POA: Diagnosis not present

## 2021-11-17 DIAGNOSIS — E7800 Pure hypercholesterolemia, unspecified: Secondary | ICD-10-CM | POA: Diagnosis not present

## 2021-11-17 DIAGNOSIS — M25511 Pain in right shoulder: Secondary | ICD-10-CM | POA: Diagnosis not present

## 2021-11-17 DIAGNOSIS — R413 Other amnesia: Secondary | ICD-10-CM | POA: Diagnosis not present

## 2021-11-17 DIAGNOSIS — Z8673 Personal history of transient ischemic attack (TIA), and cerebral infarction without residual deficits: Secondary | ICD-10-CM | POA: Diagnosis not present

## 2021-11-30 ENCOUNTER — Ambulatory Visit: Payer: Medicare Other | Admitting: Cardiovascular Disease

## 2021-12-01 ENCOUNTER — Telehealth: Payer: Self-pay

## 2021-12-01 DIAGNOSIS — R634 Abnormal weight loss: Secondary | ICD-10-CM | POA: Diagnosis not present

## 2021-12-01 DIAGNOSIS — F03B18 Unspecified dementia, moderate, with other behavioral disturbance: Secondary | ICD-10-CM | POA: Diagnosis not present

## 2021-12-01 DIAGNOSIS — Z79899 Other long term (current) drug therapy: Secondary | ICD-10-CM | POA: Diagnosis not present

## 2021-12-01 NOTE — Telephone Encounter (Signed)
Demetria from [facility] home [facility] called in regards to pt.   They need a code for their chart.   They see Dr. Darl Householder dx was cerebral atrophy. They need to know is it senile cerebral atrophy or a more specific reason for pts falls.   A CB number for Demetria : [phone]

## 2021-12-03 ENCOUNTER — Other Ambulatory Visit: Payer: Self-pay | Admitting: Neurology

## 2021-12-03 DIAGNOSIS — I10 Essential (primary) hypertension: Secondary | ICD-10-CM | POA: Diagnosis not present

## 2021-12-03 DIAGNOSIS — F03B18 Unspecified dementia, moderate, with other behavioral disturbance: Secondary | ICD-10-CM

## 2021-12-03 DIAGNOSIS — M25511 Pain in right shoulder: Secondary | ICD-10-CM | POA: Diagnosis not present

## 2021-12-03 DIAGNOSIS — E7800 Pure hypercholesterolemia, unspecified: Secondary | ICD-10-CM | POA: Diagnosis not present

## 2021-12-03 DIAGNOSIS — G311 Senile degeneration of brain, not elsewhere classified: Secondary | ICD-10-CM | POA: Diagnosis not present

## 2021-12-03 DIAGNOSIS — R413 Other amnesia: Secondary | ICD-10-CM | POA: Diagnosis not present

## 2021-12-03 DIAGNOSIS — R296 Repeated falls: Secondary | ICD-10-CM | POA: Diagnosis not present

## 2021-12-03 NOTE — Telephone Encounter (Signed)
She does have senile cerebral atrophy, dementia and weakness with frequent falls.

## 2021-12-03 NOTE — Telephone Encounter (Signed)
S/w Demetria - information provded Nothing further needed at this time

## 2021-12-07 DIAGNOSIS — R296 Repeated falls: Secondary | ICD-10-CM | POA: Diagnosis not present

## 2021-12-07 DIAGNOSIS — R002 Palpitations: Secondary | ICD-10-CM | POA: Diagnosis not present

## 2021-12-07 DIAGNOSIS — F32A Depression, unspecified: Secondary | ICD-10-CM | POA: Diagnosis not present

## 2021-12-07 DIAGNOSIS — G311 Senile degeneration of brain, not elsewhere classified: Secondary | ICD-10-CM | POA: Diagnosis not present

## 2021-12-07 DIAGNOSIS — Z8673 Personal history of transient ischemic attack (TIA), and cerebral infarction without residual deficits: Secondary | ICD-10-CM

## 2021-12-07 DIAGNOSIS — R413 Other amnesia: Secondary | ICD-10-CM | POA: Diagnosis not present

## 2021-12-07 DIAGNOSIS — I700 Atherosclerosis of aorta: Secondary | ICD-10-CM | POA: Diagnosis not present

## 2021-12-07 DIAGNOSIS — K219 Gastro-esophageal reflux disease without esophagitis: Secondary | ICD-10-CM | POA: Diagnosis not present

## 2021-12-07 DIAGNOSIS — M25511 Pain in right shoulder: Secondary | ICD-10-CM | POA: Diagnosis not present

## 2021-12-07 DIAGNOSIS — D509 Iron deficiency anemia, unspecified: Secondary | ICD-10-CM | POA: Diagnosis not present

## 2021-12-07 DIAGNOSIS — Z87891 Personal history of nicotine dependence: Secondary | ICD-10-CM | POA: Diagnosis not present

## 2021-12-07 DIAGNOSIS — I10 Essential (primary) hypertension: Secondary | ICD-10-CM | POA: Diagnosis not present

## 2021-12-07 DIAGNOSIS — E7800 Pure hypercholesterolemia, unspecified: Secondary | ICD-10-CM | POA: Diagnosis not present

## 2021-12-09 DIAGNOSIS — I10 Essential (primary) hypertension: Secondary | ICD-10-CM | POA: Diagnosis not present

## 2021-12-09 DIAGNOSIS — E7800 Pure hypercholesterolemia, unspecified: Secondary | ICD-10-CM | POA: Diagnosis not present

## 2021-12-09 DIAGNOSIS — G311 Senile degeneration of brain, not elsewhere classified: Secondary | ICD-10-CM | POA: Diagnosis not present

## 2021-12-09 DIAGNOSIS — M25511 Pain in right shoulder: Secondary | ICD-10-CM | POA: Diagnosis not present

## 2021-12-09 DIAGNOSIS — R413 Other amnesia: Secondary | ICD-10-CM | POA: Diagnosis not present

## 2021-12-09 DIAGNOSIS — R296 Repeated falls: Secondary | ICD-10-CM | POA: Diagnosis not present

## 2021-12-10 ENCOUNTER — Telehealth: Payer: Self-pay | Admitting: Internal Medicine

## 2021-12-10 NOTE — Telephone Encounter (Signed)
Called and notified Rachel Martinez - I could not find information regarding program through [employer].

## 2021-12-14 DIAGNOSIS — R296 Repeated falls: Secondary | ICD-10-CM | POA: Diagnosis not present

## 2021-12-14 DIAGNOSIS — G311 Senile degeneration of brain, not elsewhere classified: Secondary | ICD-10-CM | POA: Diagnosis not present

## 2021-12-14 DIAGNOSIS — E7800 Pure hypercholesterolemia, unspecified: Secondary | ICD-10-CM | POA: Diagnosis not present

## 2021-12-14 DIAGNOSIS — I10 Essential (primary) hypertension: Secondary | ICD-10-CM | POA: Diagnosis not present

## 2021-12-14 DIAGNOSIS — M25511 Pain in right shoulder: Secondary | ICD-10-CM | POA: Diagnosis not present

## 2021-12-14 DIAGNOSIS — R413 Other amnesia: Secondary | ICD-10-CM | POA: Diagnosis not present

## 2021-12-17 DIAGNOSIS — M25511 Pain in right shoulder: Secondary | ICD-10-CM | POA: Diagnosis not present

## 2021-12-17 DIAGNOSIS — R413 Other amnesia: Secondary | ICD-10-CM | POA: Diagnosis not present

## 2021-12-17 DIAGNOSIS — E7800 Pure hypercholesterolemia, unspecified: Secondary | ICD-10-CM | POA: Diagnosis not present

## 2021-12-17 DIAGNOSIS — I700 Atherosclerosis of aorta: Secondary | ICD-10-CM | POA: Diagnosis not present

## 2021-12-17 DIAGNOSIS — Z87891 Personal history of nicotine dependence: Secondary | ICD-10-CM | POA: Diagnosis not present

## 2021-12-17 DIAGNOSIS — D509 Iron deficiency anemia, unspecified: Secondary | ICD-10-CM | POA: Diagnosis not present

## 2021-12-17 DIAGNOSIS — Z8673 Personal history of transient ischemic attack (TIA), and cerebral infarction without residual deficits: Secondary | ICD-10-CM | POA: Diagnosis not present

## 2021-12-17 DIAGNOSIS — I10 Essential (primary) hypertension: Secondary | ICD-10-CM | POA: Diagnosis not present

## 2021-12-17 DIAGNOSIS — G311 Senile degeneration of brain, not elsewhere classified: Secondary | ICD-10-CM | POA: Diagnosis not present

## 2021-12-17 DIAGNOSIS — R002 Palpitations: Secondary | ICD-10-CM | POA: Diagnosis not present

## 2021-12-17 DIAGNOSIS — K219 Gastro-esophageal reflux disease without esophagitis: Secondary | ICD-10-CM | POA: Diagnosis not present

## 2021-12-17 DIAGNOSIS — F32A Depression, unspecified: Secondary | ICD-10-CM | POA: Diagnosis not present

## 2021-12-17 DIAGNOSIS — R296 Repeated falls: Secondary | ICD-10-CM | POA: Diagnosis not present

## 2021-12-24 DIAGNOSIS — I10 Essential (primary) hypertension: Secondary | ICD-10-CM | POA: Diagnosis not present

## 2021-12-24 DIAGNOSIS — R296 Repeated falls: Secondary | ICD-10-CM | POA: Diagnosis not present

## 2021-12-24 DIAGNOSIS — G311 Senile degeneration of brain, not elsewhere classified: Secondary | ICD-10-CM | POA: Diagnosis not present

## 2021-12-24 DIAGNOSIS — M25511 Pain in right shoulder: Secondary | ICD-10-CM | POA: Diagnosis not present

## 2021-12-24 DIAGNOSIS — R413 Other amnesia: Secondary | ICD-10-CM | POA: Diagnosis not present

## 2021-12-24 DIAGNOSIS — E7800 Pure hypercholesterolemia, unspecified: Secondary | ICD-10-CM | POA: Diagnosis not present

## 2021-12-30 DIAGNOSIS — R296 Repeated falls: Secondary | ICD-10-CM | POA: Diagnosis not present

## 2021-12-30 DIAGNOSIS — M25511 Pain in right shoulder: Secondary | ICD-10-CM | POA: Diagnosis not present

## 2021-12-30 DIAGNOSIS — G311 Senile degeneration of brain, not elsewhere classified: Secondary | ICD-10-CM | POA: Diagnosis not present

## 2021-12-30 DIAGNOSIS — E7800 Pure hypercholesterolemia, unspecified: Secondary | ICD-10-CM | POA: Diagnosis not present

## 2021-12-30 DIAGNOSIS — I10 Essential (primary) hypertension: Secondary | ICD-10-CM | POA: Diagnosis not present

## 2021-12-30 DIAGNOSIS — R413 Other amnesia: Secondary | ICD-10-CM | POA: Diagnosis not present

## 2022-01-04 ENCOUNTER — Telehealth: Payer: Self-pay

## 2022-01-04 NOTE — Telephone Encounter (Signed)
Please clarify symptoms.   Does he feel the zoloft has helped some?

## 2022-01-04 NOTE — Telephone Encounter (Signed)
Patient's son, Shamaria Kavan, called to state patient's anxiety is increasing in the afternoons and he thinks we may need to adjust her medication.  I scheduled patient for an appointment with Dr. Einar Pheasant on 02/17/2022.

## 2022-01-06 ENCOUNTER — Telehealth: Payer: Self-pay

## 2022-01-06 DIAGNOSIS — R296 Repeated falls: Secondary | ICD-10-CM | POA: Diagnosis not present

## 2022-01-06 DIAGNOSIS — E7800 Pure hypercholesterolemia, unspecified: Secondary | ICD-10-CM | POA: Diagnosis not present

## 2022-01-06 DIAGNOSIS — G311 Senile degeneration of brain, not elsewhere classified: Secondary | ICD-10-CM | POA: Diagnosis not present

## 2022-01-06 DIAGNOSIS — R413 Other amnesia: Secondary | ICD-10-CM | POA: Diagnosis not present

## 2022-01-06 DIAGNOSIS — M25511 Pain in right shoulder: Secondary | ICD-10-CM | POA: Diagnosis not present

## 2022-01-06 DIAGNOSIS — I10 Essential (primary) hypertension: Secondary | ICD-10-CM | POA: Diagnosis not present

## 2022-01-06 MED ORDER — BUSPIRONE HCL 5 MG PO TABS: 5.0000 mg | ORAL_TABLET | Freq: Every day | ORAL | 2 refills | Status: DC

## 2022-01-06 NOTE — Telephone Encounter (Signed)
Ronalee Belts agreeable Sent

## 2022-01-06 NOTE — Telephone Encounter (Signed)
See if agreeable to try buspar '5mg'$  daily.  Call with update.

## 2022-01-07 ENCOUNTER — Other Ambulatory Visit: Payer: Self-pay

## 2022-01-07 MED ORDER — BUSPIRONE HCL 5 MG PO TABS: 5.0000 mg | ORAL_TABLET | Freq: Every day | ORAL | 2 refills | Status: DC

## 2022-01-07 NOTE — Telephone Encounter (Signed)
[facility]

## 2022-01-07 NOTE — Telephone Encounter (Signed)
The patient's daughter in law called stating the medication has not been sent to the pharmacy.

## 2022-01-14 DIAGNOSIS — E7800 Pure hypercholesterolemia, unspecified: Secondary | ICD-10-CM | POA: Diagnosis not present

## 2022-01-14 DIAGNOSIS — G311 Senile degeneration of brain, not elsewhere classified: Secondary | ICD-10-CM | POA: Diagnosis not present

## 2022-01-14 DIAGNOSIS — I10 Essential (primary) hypertension: Secondary | ICD-10-CM | POA: Diagnosis not present

## 2022-01-14 DIAGNOSIS — R413 Other amnesia: Secondary | ICD-10-CM | POA: Diagnosis not present

## 2022-01-14 DIAGNOSIS — M25511 Pain in right shoulder: Secondary | ICD-10-CM | POA: Diagnosis not present

## 2022-01-14 DIAGNOSIS — R296 Repeated falls: Secondary | ICD-10-CM | POA: Diagnosis not present

## 2022-01-16 ENCOUNTER — Other Ambulatory Visit: Payer: Self-pay | Admitting: Internal Medicine

## 2022-01-18 NOTE — Telephone Encounter (Signed)
S/w Ronalee Belts - pt is not taking Aricept. Refill declined

## 2022-02-10 ENCOUNTER — Encounter: Payer: Self-pay | Admitting: Oncology

## 2022-02-17 ENCOUNTER — Encounter: Payer: Self-pay | Admitting: Internal Medicine

## 2022-02-17 ENCOUNTER — Ambulatory Visit (INDEPENDENT_AMBULATORY_CARE_PROVIDER_SITE_OTHER): Payer: Medicare Other | Admitting: Internal Medicine

## 2022-02-17 VITALS — BP 110/60 | HR 78 | Temp 97.9°F | Resp 16 | Ht 62.0 in | Wt 127.0 lb

## 2022-02-17 DIAGNOSIS — E7800 Pure hypercholesterolemia, unspecified: Secondary | ICD-10-CM | POA: Diagnosis not present

## 2022-02-17 DIAGNOSIS — Z8673 Personal history of transient ischemic attack (TIA), and cerebral infarction without residual deficits: Secondary | ICD-10-CM

## 2022-02-17 DIAGNOSIS — G319 Degenerative disease of nervous system, unspecified: Secondary | ICD-10-CM

## 2022-02-17 DIAGNOSIS — R634 Abnormal weight loss: Secondary | ICD-10-CM | POA: Diagnosis not present

## 2022-02-17 DIAGNOSIS — F419 Anxiety disorder, unspecified: Secondary | ICD-10-CM | POA: Diagnosis not present

## 2022-02-17 DIAGNOSIS — D509 Iron deficiency anemia, unspecified: Secondary | ICD-10-CM | POA: Diagnosis not present

## 2022-02-17 DIAGNOSIS — R479 Unspecified speech disturbances: Secondary | ICD-10-CM | POA: Diagnosis not present

## 2022-02-17 DIAGNOSIS — I10 Essential (primary) hypertension: Secondary | ICD-10-CM | POA: Diagnosis not present

## 2022-02-17 MED ORDER — BUSPIRONE HCL 5 MG PO TABS: 5.0000 mg | ORAL_TABLET | Freq: Two times a day (BID) | ORAL | 2 refills | Status: DC

## 2022-02-17 NOTE — Progress Notes (Signed)
Subjective:    Patient ID: Rachel Martinez, female    DOB: November 08, 1927, 89 y.o.   MRN: [id number]  Patient here for  Chief Complaint  Patient presents with   Anxiety   Dementia    HPI Work in appt.  Work in with concerns regarding increased anxiety.  Accompanied by her son.  History obtained from both of them.  Reports she sleeps a lot.  Increased anxiety.  Yells out for someone.  Afternoons are worse.  Speech has changed.  Words at times are garbled.  Is eating.  No nausea or vomiting.  No complaints of pain.  Moves a lot.  Is currently on zoloft.  Added buspar '5mg'$  q day.  Cannot tell a big difference.     Past Medical History:  Diagnosis Date   Anemia    Chicken pox    CVA (cerebral vascular accident) ([place]) 8/06   Depression    GERD (gastroesophageal reflux disease)    Heart palpitations    Hypercholesteremia    Hypertension    Stroke [facility])    Past Surgical History:  Procedure Laterality Date   APPENDECTOMY     Family History  Problem Relation Age of Onset   CVA Mother    Diabetes Father    Heart disease Father    Cancer Sister        breast    Diabetes Sister    Cancer Brother        stomach   Leukemia Brother    Lung disease Brother    Hypertension Sister    Cancer Sister        stomach   Diabetes Sister    Mental illness Brother        suicide   Breast cancer Paternal Aunt    Diabetes Son    Social History   Socioeconomic History   Marital status: Widowed    Spouse name: Not on file   Number of children: Not on file   Years of education: Not on file   Highest education level: Not on file  Occupational History   Not on file  Tobacco Use   Smoking status: Former    Packs/day: 0.25    Years: 30.00    Total pack years: 7.50    Types: Cigarettes    Quit date: 09/13/2003    Years since quitting: 18.4   Smokeless tobacco: Never  Vaping Use   Vaping Use: Never used  Substance and Sexual Activity   Alcohol use: No    Alcohol/week: 0.0 standard  drinks of alcohol   Drug use: No   Sexual activity: Never  Other Topics Concern   Not on file  Social History Narrative   Not on file   Social Determinants of Health   Financial Resource Strain: Low Risk  (06/07/2021)   Overall Financial Resource Strain (CARDIA)    Difficulty of Paying Living Expenses: Not hard at all  Food Insecurity: No Food Insecurity (06/07/2021)   Hunger Vital Sign    Worried About Running Out of Food in the Last Year: Never true    [place] in the Last Year: Never true  Transportation Needs: No Transportation Needs (06/07/2021)   PRAPARE - Hydrologist (Medical): No    Lack of Transportation (Non-Medical): No  Physical Activity: Insufficiently Active (05/29/2018)   Exercise Vital Sign    Days of Exercise per Week: 2 days    Minutes of Exercise per  Session: 10 min  Stress: No Stress Concern Present (06/07/2021)   [place]    Feeling of Stress : Not at all  Social Connections: Unknown (06/07/2021)   Social Connection and Isolation Panel [NHANES]    Frequency of Communication with Friends and Family: More than three times a week    Frequency of Social Gatherings with Friends and Family: Not on file    Attends Religious Services: Not on file    Active Member of Clubs or Organizations: Not on file    Attends Archivist Meetings: Not on file    Marital Status: Not on file     Review of Systems  Constitutional:  Negative for appetite change and unexpected weight change.  HENT:  Negative for congestion and sinus pressure.   Respiratory:  Negative for cough, chest tightness and shortness of breath.   Cardiovascular:  Negative for chest pain, palpitations and leg swelling.  Gastrointestinal:  Negative for abdominal pain, diarrhea, nausea and vomiting.  Genitourinary:  Negative for difficulty urinating and dysuria.  Musculoskeletal:  Negative for joint  swelling and myalgias.  Skin:  Negative for color change and rash.  Neurological:  Negative for dizziness and headaches.  Psychiatric/Behavioral:         Increased anxiety and agitation at times.  Afternoon is worse.  Talking about her dad, yelling out for someone to come to her, etc.  On zoloft.  Taking buspar '5mg'$  in the am..         Objective:     BP 110/60   Pulse 78   Temp 97.9 F (36.6 C)   Resp 16   Ht '5\' 2"'$  (1.575 m)   Wt 127 lb (57.6 kg)   SpO2 98%   BMI 23.23 kg/m  Wt Readings from Last 3 Encounters:  02/17/22 127 lb (57.6 kg)  11/11/21 120 lb 3.2 oz (54.5 kg)  08/23/21 111 lb 11.2 oz (50.7 kg)    Physical Exam Vitals reviewed.  Constitutional:      General: She is not in acute distress.    Appearance: Normal appearance.  HENT:     Head: Normocephalic and atraumatic.     Right Ear: External ear normal.     Left Ear: External ear normal.  Eyes:     General: No scleral icterus.       Right eye: No discharge.        Left eye: No discharge.     Conjunctiva/sclera: Conjunctivae normal.  Neck:     Thyroid: No thyromegaly.  Cardiovascular:     Rate and Rhythm: Normal rate and regular rhythm.  Pulmonary:     Effort: No respiratory distress.     Breath sounds: Normal breath sounds. No wheezing.  Abdominal:     General: Bowel sounds are normal.     Palpations: Abdomen is soft.     Tenderness: There is no abdominal tenderness.  Musculoskeletal:        General: No swelling or tenderness.     Cervical back: Neck supple. No tenderness.  Lymphadenopathy:     Cervical: No cervical adenopathy.  Skin:    Findings: No erythema or rash.  Neurological:     Mental Status: She is alert.  Psychiatric:        Mood and Affect: Mood normal.        Behavior: Behavior normal.      Outpatient Encounter Medications as of 02/17/2022  Medication Sig  busPIRone (BUSPAR) 5 MG tablet Take 1 tablet (5 mg total) by mouth 2 (two) times daily.   nitroGLYCERIN (NITROSTAT) 0.4  MG SL tablet Place 1 tablet (0.4 mg total) under the tongue every 5 (five) minutes as needed for chest pain.   propranolol (INDERAL) 10 MG tablet Take 1 tablet (10 mg total) by mouth 2 (two) times daily as needed (As needed for palpitations).   sertraline (ZOLOFT) 50 MG tablet TAKE 1 TABLET(50 MG) BY MOUTH DAILY   [DISCONTINUED] busPIRone (BUSPAR) 5 MG tablet Take 1 tablet (5 mg total) by mouth daily.   [DISCONTINUED] calcium carbonate (TUMS - DOSED IN MG ELEMENTAL CALCIUM) 500 MG chewable tablet Chew 1 tablet by mouth 3 (three) times daily as needed.    [DISCONTINUED] Cyanocobalamin (VITAMIN B 12 PO) Take 100 mcg by mouth daily.   No facility-administered encounter medications on file as of 02/17/2022.     Lab Results  Component Value Date   WBC 8.0 11/11/2021   HGB 12.3 11/11/2021   HCT 36.6 11/11/2021   PLT 216.0 11/11/2021   GLUCOSE 75 11/11/2021   CHOL 227 (H) 11/06/2020   TRIG 114.0 11/06/2020   HDL 74.30 11/06/2020   LDLCALC 130 (H) 11/06/2020   ALT 11 11/11/2021   AST 22 11/11/2021   NA 142 11/11/2021   K 4.4 11/11/2021   CL 104 11/11/2021   CREATININE 1.09 11/11/2021   BUN 33 (H) 11/11/2021   CO2 25 11/11/2021   TSH 2.08 11/11/2021   HGBA1C 5.4 10/29/2018    DG Chest Port 1 View  Result Date: 04/13/2021 CLINICAL DATA:  Chest pain EXAM: PORTABLE CHEST 1 VIEW COMPARISON:  04/11/2021 FINDINGS: Lungs are clear.  No pleural effusion or pneumothorax. The heart is normal in size.  Thoracic aortic atherosclerosis. IMPRESSION: No evidence of acute cardiopulmonary disease. Electronically Signed   By: Julian Hy M.D.   On: 04/13/2021 22:02       Assessment & Plan:  Anxiety Assessment & Plan: Discussed.  Behavior as outlined.  Continue zoloft.  Increase buspar to '5mg'$  bid.  Call with update.     Cerebral atrophy [facility]) Assessment & Plan: Noted on previous CT scan.  Was on aricept.  Per report, not taking.  Follow.     History of CVA (cerebrovascular  accident) Assessment & Plan: ECASA '81mg'$  q day.  Follow.  Off plavix.    Hypercholesterolemia Assessment & Plan: Off pravastatin.  Age 69.  Hold on starting new medication at this time.     Primary hypertension Assessment & Plan: Blood pressure as outlined.  Has propranolol.  Follow pressures.     Iron deficiency anemia, unspecified iron deficiency anemia type Assessment & Plan: Has seen hematology previously - required iron infusion.  Check cbc and iron studies.    Weight loss Assessment & Plan: Eating well.  Weight is up.  Follow.     Speech disturbance, unspecified type Assessment & Plan: Speech - more difficult to understand.  Discussed.  Continue daily aspirin.  Discussed further neurological w/up, including MRI.  Hold on further evaluation.  Add buspar to help with anxiety.  Follow.     Other orders -     busPIRone HCl; Take 1 tablet (5 mg total) by mouth 2 (two) times daily.  Dispense: 60 tablet; Refill: 2     Einar Pheasant, MD

## 2022-02-20 ENCOUNTER — Encounter: Payer: Self-pay | Admitting: Internal Medicine

## 2022-02-20 DIAGNOSIS — R479 Unspecified speech disturbances: Secondary | ICD-10-CM | POA: Insufficient documentation

## 2022-02-20 NOTE — Assessment & Plan Note (Signed)
Noted on previous CT scan.  Was on aricept.  Per report, not taking.  Follow.

## 2022-02-20 NOTE — Assessment & Plan Note (Signed)
Off pravastatin.  Age 89.  Hold on starting new medication at this time.

## 2022-02-20 NOTE — Assessment & Plan Note (Signed)
Has seen hematology previously - required iron infusion.  Check cbc and iron studies.

## 2022-02-20 NOTE — Assessment & Plan Note (Signed)
Speech - more difficult to understand.  Discussed.  Continue daily aspirin.  Discussed further neurological w/up, including MRI.  Hold on further evaluation.  Add buspar to help with anxiety.  Follow.

## 2022-02-20 NOTE — Assessment & Plan Note (Signed)
Blood pressure as outlined.  Has propranolol.  Follow pressures.

## 2022-02-20 NOTE — Assessment & Plan Note (Signed)
Eating well.  Weight is up.  Follow.

## 2022-02-20 NOTE — Assessment & Plan Note (Signed)
Discussed.  Behavior as outlined.  Continue zoloft.  Increase buspar to '5mg'$  bid.  Call with update.

## 2022-02-20 NOTE — Assessment & Plan Note (Signed)
ECASA '81mg'$  q day.  Follow.  Off plavix.

## 2022-03-10 ENCOUNTER — Encounter: Payer: Self-pay | Admitting: Nurse Practitioner

## 2022-03-10 ENCOUNTER — Ambulatory Visit (INDEPENDENT_AMBULATORY_CARE_PROVIDER_SITE_OTHER): Payer: Medicare Other | Admitting: Nurse Practitioner

## 2022-03-10 VITALS — BP 140/70 | HR 65 | Temp 97.4°F | Ht 62.0 in | Wt 129.6 lb

## 2022-03-10 DIAGNOSIS — R350 Frequency of micturition: Secondary | ICD-10-CM | POA: Diagnosis not present

## 2022-03-10 DIAGNOSIS — Z87898 Personal history of other specified conditions: Secondary | ICD-10-CM | POA: Insufficient documentation

## 2022-03-10 LAB — POCT URINALYSIS DIPSTICK
Bilirubin, UA: NEGATIVE
Blood, UA: NEGATIVE
Glucose, UA: NEGATIVE
Ketones, UA: NEGATIVE
Leukocytes, UA: NEGATIVE
Nitrite, UA: NEGATIVE
Protein, UA: POSITIVE — AB
Spec Grav, UA: 1.025 (ref 1.010–1.025)
Urobilinogen, UA: 1 E.U./dL
pH, UA: 6 (ref 5.0–8.0)

## 2022-03-10 NOTE — Patient Instructions (Addendum)
The urine is negative for infection or blood. Please bring the urine sample tomorrow so we can send it for culture. Increase fluid intake.

## 2022-03-10 NOTE — Progress Notes (Unsigned)
Established Patient Office Visit  Subjective:  Patient ID: Rachel Martinez, female    DOB: [birth year]  Age: 89 y.o. MRN: [id number]  CC:  Chief Complaint  Patient presents with  . Acute Visit    Urine odor and frequency     HPI  Rachel Martinez presents for  HPI   Past Medical History:  Diagnosis Date  . Anemia   . Chicken pox   . CVA (cerebral vascular accident) ([place]) 8/06  . Depression   . GERD (gastroesophageal reflux disease)   . Heart palpitations   . Hypercholesteremia   . Hypertension   . Stroke [facility])     Past Surgical History:  Procedure Laterality Date  . APPENDECTOMY      Family History  Problem Relation Age of Onset  . CVA Mother   . Diabetes Father   . Heart disease Father   . Cancer Sister        breast   . Diabetes Sister   . Cancer Brother        stomach  . Leukemia Brother   . Lung disease Brother   . Hypertension Sister   . Cancer Sister        stomach  . Diabetes Sister   . Mental illness Brother        suicide  . Breast cancer Paternal Aunt   . Diabetes Son     Social History   Socioeconomic History  . Marital status: Widowed    Spouse name: Not on file  . Number of children: Not on file  . Years of education: Not on file  . Highest education level: Not on file  Occupational History  . Not on file  Tobacco Use  . Smoking status: Former    Packs/day: 0.25    Years: 30.00    Total pack years: 7.50    Types: Cigarettes    Quit date: 09/13/2003    Years since quitting: 18.5  . Smokeless tobacco: Never  Vaping Use  . Vaping Use: Never used  Substance and Sexual Activity  . Alcohol use: No    Alcohol/week: 0.0 standard drinks of alcohol  . Drug use: No  . Sexual activity: Never  Other Topics Concern  . Not on file  Social History Narrative  . Not on file   Social Determinants of Health   Financial Resource Strain: Low Risk  (06/07/2021)   Overall Financial Resource Strain (CARDIA)   . Difficulty of Paying  Living Expenses: Not hard at all  Food Insecurity: No Food Insecurity (06/07/2021)   Hunger Vital Sign   . Worried About Charity fundraiser in the Last Year: Never true   . Ran Out of Food in the Last Year: Never true  Transportation Needs: No Transportation Needs (06/07/2021)   PRAPARE - Transportation   . Lack of Transportation (Medical): No   . Lack of Transportation (Non-Medical): No  Physical Activity: Insufficiently Active (05/29/2018)   Exercise Vital Sign   . Days of Exercise per Week: 2 days   . Minutes of Exercise per Session: 10 min  Stress: No Stress Concern Present (06/07/2021)   [place]   . Feeling of Stress : Not at all  Social Connections: Unknown (06/07/2021)   Social Connection and Isolation Panel [NHANES]   . Frequency of Communication with Friends and Family: More than three times a week   . Frequency of Social Gatherings with Friends  and Family: Not on file   . Attends Religious Services: Not on file   . Active Member of Clubs or Organizations: Not on file   . Attends Archivist Meetings: Not on file   . Marital Status: Not on file  Intimate Partner Violence: Not At Risk (06/07/2021)   Humiliation, Afraid, Rape, and Kick questionnaire   . Fear of Current or Ex-Partner: No   . Emotionally Abused: No   . Physically Abused: No   . Sexually Abused: No     Outpatient Medications Prior to Visit  Medication Sig Dispense Refill  . busPIRone (BUSPAR) 5 MG tablet Take 1 tablet (5 mg total) by mouth 2 (two) times daily. 60 tablet 2  . propranolol (INDERAL) 10 MG tablet Take 1 tablet (10 mg total) by mouth 2 (two) times daily as needed (As needed for palpitations). 180 tablet 2  . sertraline (ZOLOFT) 50 MG tablet TAKE 1 TABLET(50 MG) BY MOUTH DAILY 90 tablet 0  . nitroGLYCERIN (NITROSTAT) 0.4 MG SL tablet Place 1 tablet (0.4 mg total) under the tongue every 5 (five) minutes as needed for chest  pain. 30 tablet 3   No facility-administered medications prior to visit.    Allergies  Allergen Reactions  . Amoxicillin-Pot Clavulanate     Other reaction(s): Unknown  . Ciprofloxacin     Other reaction(s): Unknown  . Codeine Sulfate   . Imipenem     Other reaction(s): Unknown  . Metronidazole     Other reaction(s): Unknown    ROS Review of Systems  HENT: Negative.    Eyes: Negative.   Respiratory: Negative.    Cardiovascular: Negative.   Gastrointestinal: Negative.       Objective:    Physical Exam  BP (!) 140/70   Pulse 65   Temp (!) 97.4 F (36.3 C) (Oral)   Ht '5\' 2"'$  (1.575 m)   Wt 129 lb 9.6 oz (58.8 kg)   SpO2 98%   BMI 23.70 kg/m  Wt Readings from Last 3 Encounters:  03/10/22 129 lb 9.6 oz (58.8 kg)  02/17/22 127 lb (57.6 kg)  11/11/21 120 lb 3.2 oz (54.5 kg)     Health Maintenance  Topic Date Due  . DTaP/Tdap/Td (1 - Tdap) Never done  . Zoster Vaccines- Shingrix (1 of 2) Never done  . COVID-19 Vaccine (3 - 2023-24 season) 03/26/2022 (Originally 09/24/2021)  . Pneumonia Vaccine 16+ Years old (2 of 2 - PCV) 04/13/2022 (Originally 10/23/2017)  . INFLUENZA VACCINE  04/24/2022 (Originally 08/24/2021)  . DEXA SCAN  06/08/2022 (Originally 03/10/1994)  . Medicare Annual Wellness (AWV)  06/08/2022  . HPV VACCINES  Aged Out  . MAMMOGRAM  Discontinued    There are no preventive care reminders to display for this patient.  Lab Results  Component Value Date   TSH 2.08 11/11/2021   Lab Results  Component Value Date   WBC 8.0 11/11/2021   HGB 12.3 11/11/2021   HCT 36.6 11/11/2021   MCV 93.9 11/11/2021   PLT 216.0 11/11/2021   Lab Results  Component Value Date   NA 142 11/11/2021   K 4.4 11/11/2021   CO2 25 11/11/2021   GLUCOSE 75 11/11/2021   BUN 33 (H) 11/11/2021   CREATININE 1.09 11/11/2021   BILITOT 0.4 11/11/2021   ALKPHOS 125 (H) 11/11/2021   AST 22 11/11/2021   ALT 11 11/11/2021   PROT 6.3 11/11/2021   ALBUMIN 4.0 11/11/2021   CALCIUM  9.2 11/11/2021   ANIONGAP 5  04/13/2021   GFR 44.05 (L) 11/11/2021   Lab Results  Component Value Date   CHOL 227 (H) 11/06/2020   Lab Results  Component Value Date   HDL 74.30 11/06/2020   Lab Results  Component Value Date   LDLCALC 130 (H) 11/06/2020   Lab Results  Component Value Date   TRIG 114.0 11/06/2020   Lab Results  Component Value Date   CHOLHDL 3 11/06/2020   Lab Results  Component Value Date   HGBA1C 5.4 10/29/2018      Assessment & Plan:   Problem List Items Addressed This Visit   None Visit Diagnoses     Urine frequency    -  Primary   Relevant Orders   POCT Urinalysis Dipstick (Completed)        No orders of the defined types were placed in this encounter.    Follow-up: No follow-ups on file.    Theresia Lo, NP

## 2022-03-21 NOTE — Assessment & Plan Note (Signed)
Urinalysis negative for leukocytes. Advised to increase hydration. Sample not enough to send for culture advised to bring urine sample tomorrow for culture.

## 2022-04-07 ENCOUNTER — Telehealth: Payer: Self-pay | Admitting: Internal Medicine

## 2022-04-07 ENCOUNTER — Encounter: Payer: Self-pay | Admitting: Internal Medicine

## 2022-04-07 NOTE — Telephone Encounter (Signed)
Please schedule an appt to discuss treatment options.  Thanks.

## 2022-04-07 NOTE — Telephone Encounter (Signed)
Called and spoke to pt daughter Jarrett Soho and she is wanting to know if you can prescribe something else like Trazadone  for Mom to help her sleep because the Melatonin is not working anymore.

## 2022-04-07 NOTE — Telephone Encounter (Signed)
Suleika Quero called in staying that her mother in law its not sleeping at night, and was wondering if Dr. Nicki Reaper can prescribed her anything for this issue? She said that they had tried Melotonin and still does not work. Pt wakes up in the middle of the night wondering around. Any questions or concern, she's '@[phone]'$ [phone].

## 2022-04-07 NOTE — Telephone Encounter (Signed)
LVM to call back to schedule appt to discuss options.

## 2022-04-08 DIAGNOSIS — G8929 Other chronic pain: Secondary | ICD-10-CM | POA: Diagnosis not present

## 2022-04-08 DIAGNOSIS — M7581 Other shoulder lesions, right shoulder: Secondary | ICD-10-CM | POA: Diagnosis not present

## 2022-04-08 NOTE — Telephone Encounter (Signed)
Pt son called in for an appt for his mom, however, Dr. Nicki Reaper does not have any available opening until her scheduled appt on 4/25. He was wondering if anything sooner than that??

## 2022-04-11 ENCOUNTER — Emergency Department: Payer: Medicare Other

## 2022-04-11 ENCOUNTER — Emergency Department
Admission: EM | Admit: 2022-04-11 | Discharge: 2022-04-11 | Disposition: A | Discharge status: Home / Self Care | Payer: Medicare Other | Attending: Emergency Medicine | Admitting: Emergency Medicine

## 2022-04-11 ENCOUNTER — Other Ambulatory Visit: Payer: Self-pay

## 2022-04-11 DIAGNOSIS — Z8673 Personal history of transient ischemic attack (TIA), and cerebral infarction without residual deficits: Secondary | ICD-10-CM | POA: Diagnosis not present

## 2022-04-11 DIAGNOSIS — S7012XA Contusion of left thigh, initial encounter: Secondary | ICD-10-CM | POA: Diagnosis not present

## 2022-04-11 DIAGNOSIS — Z043 Encounter for examination and observation following other accident: Secondary | ICD-10-CM | POA: Diagnosis not present

## 2022-04-11 DIAGNOSIS — F0390 Unspecified dementia without behavioral disturbance: Secondary | ICD-10-CM | POA: Diagnosis not present

## 2022-04-11 DIAGNOSIS — W1830XA Fall on same level, unspecified, initial encounter: Secondary | ICD-10-CM | POA: Insufficient documentation

## 2022-04-11 DIAGNOSIS — I10 Essential (primary) hypertension: Secondary | ICD-10-CM | POA: Diagnosis not present

## 2022-04-11 DIAGNOSIS — W19XXXA Unspecified fall, initial encounter: Secondary | ICD-10-CM | POA: Diagnosis not present

## 2022-04-11 DIAGNOSIS — M25552 Pain in left hip: Secondary | ICD-10-CM | POA: Diagnosis not present

## 2022-04-11 DIAGNOSIS — I959 Hypotension, unspecified: Secondary | ICD-10-CM | POA: Diagnosis not present

## 2022-04-11 DIAGNOSIS — M47812 Spondylosis without myelopathy or radiculopathy, cervical region: Secondary | ICD-10-CM | POA: Diagnosis not present

## 2022-04-11 DIAGNOSIS — S0990XA Unspecified injury of head, initial encounter: Secondary | ICD-10-CM | POA: Diagnosis not present

## 2022-04-11 DIAGNOSIS — S79912A Unspecified injury of left hip, initial encounter: Secondary | ICD-10-CM | POA: Diagnosis not present

## 2022-04-11 MED ORDER — ACETAMINOPHEN 500 MG PO TABS
1000.0000 mg | ORAL_TABLET | Freq: Once | ORAL | Status: AC
  Administered 2022-04-11: 1000 mg via ORAL
  Filled 2022-04-11: qty 2

## 2022-04-11 NOTE — ED Notes (Signed)
This RN Walked pt around room and pt tolerated well with no issues.

## 2022-04-11 NOTE — ED Provider Notes (Signed)
[facility] Provider Note    Event Date/Time   First MD Initiated Contact with Patient 04/11/22 1517     (approximate)   History   Chief Complaint Fall   HPI  Rachel Martinez is a 89 y.o. female with past medical history of hypertension, stroke, GERD, anemia, and dementia who presents to the ED following fall.  Granddaughter at bedside states that patient fell early this morning around 5 AM, was found by family down on the ground moaning in pain shortly after the fall.  Patient has been complaining of pain in her left hip and thigh area since then, but has otherwise been acting at her baseline.  Family is unsure whether she hit her head or lost consciousness, but she does not take a blood thinner.  Patient states she is not sure why she is here, does not remember falling.  She does complain of pain in her left hip and thigh.     Physical Exam   Triage Vital Signs: ED Triage Vitals [04/11/22 1457]  Enc Vitals Group     BP (!) 134/54     Pulse Rate 64     Resp 18     Temp 97.6 F (36.4 C)     Temp Source Oral     SpO2 97 %     Weight      Height      Head Circumference      Peak Flow      Pain Score      Pain Loc      Pain Edu?      Excl. in [place]?     Most recent vital signs: Vitals:   04/11/22 1457  BP: (!) 134/54  Pulse: 64  Resp: 18  Temp: 97.6 F (36.4 C)  SpO2: 97%    Constitutional: Alert and oriented to person, but not place, time, or situation. Eyes: Conjunctivae are normal. Head: Atraumatic. Nose: No congestion/rhinnorhea. Mouth/Throat: Mucous membranes are moist.  Neck: No midline cervical spine tenderness to palpation. Cardiovascular: Normal rate, regular rhythm. Grossly normal heart sounds.  2+ radial and DP pulses bilaterally. Respiratory: Normal respiratory effort.  No retractions. Lungs CTAB.  No chest wall tenderness to palpation noted. Gastrointestinal: Soft and nontender. No distention. Musculoskeletal: Tenderness  to palpation noted over left anterior and lateral thigh as well as left hip area, range of motion intact with minimal pain. Neurologic:  Normal speech and language. No gross focal neurologic deficits are appreciated.    ED Results / Procedures / Treatments   Labs (all labs ordered are listed, but only abnormal results are displayed) Labs Reviewed - No data to display   EKG  ED ECG REPORT I, Blake Divine, the attending physician, personally viewed and interpreted this ECG.   Date: 04/11/2022  EKG Time: 15:15  Rate: 72  Rhythm: normal sinus rhythm, sinus arrhythmia  Axis: Normal  Intervals:none  ST&T Change: None  RADIOLOGY Left hip x-ray reviewed and interpreted by me with no fracture or dislocation.  PROCEDURES:  Critical Care performed: No  Procedures   MEDICATIONS ORDERED IN ED: Medications  acetaminophen (TYLENOL) tablet 1,000 mg (1,000 mg Oral Given 04/11/22 1548)     IMPRESSION / MDM / ASSESSMENT AND PLAN / ED COURSE  I reviewed the triage vital signs and the nursing notes.  89 y.o. female with past medical history of hypertension, stroke, GERD, anemia, and dementia who presents to the ED for unwitnessed fall at home, found crying out in pain and pointing to her left hip by family earlier this morning.  Patient's presentation is most consistent with acute complicated illness / injury requiring diagnostic workup.  Differential diagnosis includes, but is not limited to, hip fracture, hip dislocation, thigh contusion, intracranial injury, cervical spine injury.  Patient well-appearing and in no acute distress, vital signs are unremarkable.  Granddaughter is at bedside and states patient is at her baseline mental status, she has no focal neurologic deficits on exam.  X-ray imaging of the left hip is negative for acute traumatic injury, patient is able to range the hip but does have discomfort when doing so, we will further assess with  CT imaging.  We will also check CT head and cervical spine given it is unclear whether she hit her head or lost consciousness.  EKG with no evidence of arrhythmia or ischemia, do not feel labs and urinalysis are indicated at this time.  CT imaging of left hip as well as head and cervical spine are negative for acute process.  Following dose of Tylenol, patient reports pain is improved and she has been able to ambulate without difficulty.  Doubt occult fracture of the hip and do not feel MRI is indicated at this time.  Patient is appropriate for discharge home with PCP follow-up, granddaughter counseled to have her return to the ED for new or worsening symptoms.  Granddaughter agrees with plan.      FINAL CLINICAL IMPRESSION(S) / ED DIAGNOSES   Final diagnoses:  Fall, initial encounter  Contusion of left thigh, initial encounter     Rx / DC Orders   ED Discharge Orders     None        Note:  This document was prepared using Dragon voice recognition software and may include unintentional dictation errors.   Blake Divine, MD 04/11/22 [phone]

## 2022-04-11 NOTE — ED Triage Notes (Addendum)
Pt brought in via ems for fall. Pt co of left hip pain with ems. Pt is aphasic and has dementia at baseline according to ems. Pt took tylenol and busbar at 2pm per ems. Pt is unable to give report at this time. Pt denies pain on palpation at this time.

## 2022-04-11 NOTE — ED Notes (Signed)
This RN toileted pt, cleaned pt and changed brief at this time.

## 2022-04-13 NOTE — Telephone Encounter (Signed)
LMTCB

## 2022-04-13 NOTE — Telephone Encounter (Signed)
See phone note. Trying to schedule sooner appt

## 2022-04-14 NOTE — Telephone Encounter (Signed)
LMTCB to offer sooner appt.

## 2022-04-18 ENCOUNTER — Telehealth: Payer: Self-pay

## 2022-04-18 NOTE — Telephone Encounter (Signed)
LMTCB

## 2022-04-18 NOTE — Telephone Encounter (Signed)
        Patient  visited [facility] on 3/18   Telephone encounter attempt :  1st  A HIPAA compliant voice message was left requesting a return call.  Instructed patient to call back.   [place] [phone] [address], [place], [place] [address] Phone: [phone] Email: [email or web address]

## 2022-04-19 ENCOUNTER — Telehealth: Payer: Self-pay

## 2022-04-19 NOTE — Telephone Encounter (Signed)
     Patient  visit on 3/18  at [facility]    Have you been able to follow up with your primary care physician? Yes   The patient was or was not able to obtain any needed medicine or equipment. Yes   Are there diet recommendations that you are having difficulty following? Na   Patient expresses understanding of discharge instructions and education provided has no other needs at this time. Yes       [facility] [phone] [address], [place], [place] [address] Phone: [phone] Email: [email or web address]

## 2022-05-07 ENCOUNTER — Other Ambulatory Visit: Payer: Self-pay | Admitting: Internal Medicine

## 2022-05-09 ENCOUNTER — Emergency Department
Admission: EM | Admit: 2022-05-09 | Discharge: 2022-05-09 | Disposition: A | Discharge status: Home / Self Care | Payer: Medicare Other | Attending: Emergency Medicine | Admitting: Emergency Medicine

## 2022-05-09 ENCOUNTER — Encounter: Payer: Self-pay | Admitting: Oncology

## 2022-05-09 ENCOUNTER — Emergency Department: Payer: Medicare Other

## 2022-05-09 ENCOUNTER — Other Ambulatory Visit: Payer: Self-pay

## 2022-05-09 DIAGNOSIS — W08XXXA Fall from other furniture, initial encounter: Secondary | ICD-10-CM | POA: Insufficient documentation

## 2022-05-09 DIAGNOSIS — S8012XA Contusion of left lower leg, initial encounter: Secondary | ICD-10-CM | POA: Insufficient documentation

## 2022-05-09 DIAGNOSIS — I10 Essential (primary) hypertension: Secondary | ICD-10-CM | POA: Insufficient documentation

## 2022-05-09 DIAGNOSIS — F0390 Unspecified dementia without behavioral disturbance: Secondary | ICD-10-CM | POA: Diagnosis not present

## 2022-05-09 DIAGNOSIS — M25511 Pain in right shoulder: Secondary | ICD-10-CM | POA: Insufficient documentation

## 2022-05-09 DIAGNOSIS — M549 Dorsalgia, unspecified: Secondary | ICD-10-CM | POA: Insufficient documentation

## 2022-05-09 DIAGNOSIS — S199XXA Unspecified injury of neck, initial encounter: Secondary | ICD-10-CM | POA: Diagnosis not present

## 2022-05-09 DIAGNOSIS — S8011XA Contusion of right lower leg, initial encounter: Secondary | ICD-10-CM | POA: Diagnosis not present

## 2022-05-09 DIAGNOSIS — W19XXXA Unspecified fall, initial encounter: Secondary | ICD-10-CM | POA: Diagnosis not present

## 2022-05-09 DIAGNOSIS — S0990XA Unspecified injury of head, initial encounter: Secondary | ICD-10-CM | POA: Diagnosis not present

## 2022-05-09 DIAGNOSIS — S3992XA Unspecified injury of lower back, initial encounter: Secondary | ICD-10-CM | POA: Diagnosis not present

## 2022-05-09 DIAGNOSIS — R030 Elevated blood-pressure reading, without diagnosis of hypertension: Secondary | ICD-10-CM

## 2022-05-09 DIAGNOSIS — S24101A Unspecified injury at T1 level of thoracic spinal cord, initial encounter: Secondary | ICD-10-CM | POA: Diagnosis not present

## 2022-05-09 DIAGNOSIS — R262 Difficulty in walking, not elsewhere classified: Secondary | ICD-10-CM | POA: Diagnosis not present

## 2022-05-09 MED ORDER — ACETAMINOPHEN 500 MG PO TABS
1000.0000 mg | ORAL_TABLET | Freq: Once | ORAL | Status: AC
  Administered 2022-05-09: 1000 mg via ORAL
  Filled 2022-05-09: qty 2

## 2022-05-09 NOTE — ED Triage Notes (Signed)
Pt arrives from home via EMS for a fall while getting up from the couch. EMS unsure if pt takes thinners, unsure if she hit her head. Pt endorsing no pain, no obvious deformities, but family reported to EMS she isnt walking like normal and they are thinking she may have hurt her R ankle/foot.   Hx dementia, is at baseline per EMS  VSS

## 2022-05-09 NOTE — ED Provider Notes (Signed)
[facility] Provider Note    Event Date/Time   First MD Initiated Contact with Patient 05/09/22 [phone]     (approximate)   History   Fall   HPI  Rachel Martinez is a 89 y.o. female with past medical history of dementia, hypertension, CVA, anemia who presents after a fall.  Patient is not able to provide any history she does not remember any falls today.  Spoke with patient's son tells me that they heard her on the baby monitor and when they went into her room she was sitting beside the bed.  They tried to get her up but because of pain that is chronic in her right shoulder after recent fall she was not able to be helped up and then they tried to use a belt but she was complaining of back pain so they called EMS.  She is not on any blood thinners.  Otherwise she is at her baseline.  Patient denies pain currently    Past Medical History:  Diagnosis Date   Anemia    Chicken pox    CVA (cerebral vascular accident) 8/06   Depression    GERD (gastroesophageal reflux disease)    Heart palpitations    Hypercholesteremia    Hypertension    Stroke     Patient Active Problem List   Diagnosis Date Noted   History of palpitations 03/10/2022   Speech abnormality 02/20/2022   Urine frequency 08/23/2021   Skin tear of lower leg without complication, left, initial encounter 08/23/2021   Palpitations 06/27/2021   Anxiety 04/13/2021   Sinus arrhythmia seen on electrocardiogram 01/28/2021   Occasional tremors 01/28/2021   Chest pain in adult 01/28/2021   Premature beats 11/06/2020   Cerebral atrophy 06/21/2020   Memory change 06/19/2020   Rotator cuff tendinitis, right 03/12/2018   Weight loss 07/30/2017   Altered mental state 10/22/2016   Health care maintenance 05/25/2015   Frequent falls 06/29/2014   Gastric ulcer 05/22/2013   Abdominal bruit 05/10/2013   Breast nodule 05/10/2013   Heme positive stool 05/10/2013   Light headedness 05/10/2013   Abnormal  findings on diagnostic imaging of breast 05/10/2013   Disequilibrium 05/10/2013   Right shoulder pain 12/16/2012   Stress 12/16/2012   Renal insufficiency 01/11/2012   Iron deficiency anemia 01/11/2012   Disorder of kidney and ureter 01/11/2012   History of CVA (cerebrovascular accident) 01/10/2012   Hypertension 01/10/2012   Hypercholesterolemia 01/10/2012   Benign essential hypertension 01/10/2012   Cerebral artery occlusion with cerebral infarction 01/10/2012     Physical Exam  Triage Vital Signs: ED Triage Vitals [05/09/22 0758]  Enc Vitals Group     BP (!) 188/103     Pulse Rate 74     Resp 20     Temp (!) 97.5 F (36.4 C)     Temp Source Oral     SpO2 100 %     Weight 129 lb 10.1 oz (58.8 kg)     Height  (1.575 m)     Head Circumference      Peak Flow      Pain Score 0     Pain Loc      Pain Edu?      Excl. in GC?     Most recent vital signs: Vitals:   05/09/22 0800 05/09/22 0909  BP: (!) 201/88 (!) 163/81  Pulse: 79   Resp:    Temp:    SpO2: 99%  General: Awake, no distress.  CV:  Good peripheral perfusion.  Resp:  Normal effort.  Abd:  No distention.  Soft nontender Neuro:             Awake, Alert, Oriented x 1 Other:  No C-spine ttp, no focal thoracic or lumbar midline tenderness palpation, patient does complain of back pain when she is logrolled No signs of trauma to head or face No chest wall tenderness or crepitus Pelvis stable nontender able to range bilateral hips without difficulty No focal tenderness of bilateral knees or ankles patient is able to range both knees and ankle normally Ecchymosis on bilateral shins   ED Results / Procedures / Treatments  Labs (all labs ordered are listed, but only abnormal results are displayed) Labs Reviewed - No data to display   EKG     RADIOLOGY I reviewed and interpreted the CT scan of the brain which does not show any acute intracranial process    PROCEDURES:  Critical Care  performed: No  Procedures  The patient is on the cardiac monitor to evaluate for evidence of arrhythmia and/or significant heart rate changes.   MEDICATIONS ORDERED IN ED: Medications  acetaminophen (TYLENOL) tablet 1,000 mg (1,000 mg Oral Given 05/09/22 0827)     IMPRESSION / MDM / ASSESSMENT AND PLAN / ED COURSE  I reviewed the triage vital signs and the nursing notes.                              Patient's presentation is most consistent with acute presentation with potential threat to life or bodily function.  Differential diagnosis includes, but is not limited to, intracranial hemorrhage, skull fracture, thoracic or lumbar compression fracture  The patient is a 89 year old female with history of dementia walks with walker who presents after unwitnessed fall.  Her son heard her on the baby monitor and when he went into her room she was sitting beside her bed.  Was not able to be helped up because patient was complaining of back pain.   Patient is not really able to provide any history.  Does not remember falling today.  She has no complaints currently.  She is alert and oriented x 1.  No signs of trauma to head she has no midline C-spine tenderness no chest wall pelvic tenderness is able to range hips knees and ankles normally.  When we logrolled her she does not have any focal midline tenderness in her thoracic or lumbar spine but she does groan in pain.  Notably hypertensive BP 200/80, does have history of hypertension but looking back it recent ED visit she is typically well-controlled.  Plan to obtain CT head C-spine T and L-spine and give Tylenol and reassess.  CT head CT T and L-spine are negative for acute injury.  Patient's blood pressure is downtrending.  Her son is at bedside who has no concerns other than the fall today.  Patient was able to ambulate with assistance, and is at baseline per her son.  She is appropriate for discharge.       FINAL CLINICAL IMPRESSION(S) /  ED DIAGNOSES   Final diagnoses:  Fall, initial encounter  Elevated blood pressure reading     Rx / DC Orders   ED Discharge Orders     None        Note:  This document was prepared using Dragon voice recognition software and may include unintentional dictation errors.  Georga Hacking, MD 05/09/22 [phone]

## 2022-05-09 NOTE — ED Notes (Signed)
Pt ambulatory to and from bathroom with x1 assist

## 2022-05-09 NOTE — Discharge Instructions (Signed)
Your CAT scans of your head neck and back did not show any acute injuries.  Your blood pressure readings were elevated today.  Please follow-up with your primary doctor to have a blood pressure check.  You may need to be started on medications if this is consistently elevated.

## 2022-05-13 ENCOUNTER — Telehealth: Payer: Self-pay

## 2022-05-13 NOTE — Telephone Encounter (Signed)
        Patient  visited [facility] on 4/15   Telephone encounter attempt :  1st  A HIPAA compliant voice message was left requesting a return call.  Instructed patient to call back     Rachel Martinez [facility], [place] [phone] [address], [place],  [address] Phone: [phone] Email: [email or web address]       

## 2022-05-13 NOTE — Telephone Encounter (Signed)
        Patient  visited [facility] on 4/15     Telephone encounter attempt :  2nd  A HIPAA compliant voice message was left requesting a return call.  Instructed patient to call back.    Trayce Maino [facility], [place] [phone] [address], [place], [place] [address] Phone: [phone] Email: [email or web address]       

## 2022-05-19 ENCOUNTER — Ambulatory Visit (INDEPENDENT_AMBULATORY_CARE_PROVIDER_SITE_OTHER): Payer: Medicare Other | Admitting: Internal Medicine

## 2022-05-19 VITALS — BP 122/72 | HR 69 | Temp 97.9°F | Resp 16 | Ht 62.0 in | Wt 128.0 lb

## 2022-05-19 DIAGNOSIS — F419 Anxiety disorder, unspecified: Secondary | ICD-10-CM

## 2022-05-19 DIAGNOSIS — D509 Iron deficiency anemia, unspecified: Secondary | ICD-10-CM | POA: Diagnosis not present

## 2022-05-19 DIAGNOSIS — R410 Disorientation, unspecified: Secondary | ICD-10-CM

## 2022-05-19 DIAGNOSIS — R296 Repeated falls: Secondary | ICD-10-CM

## 2022-05-19 DIAGNOSIS — Z8673 Personal history of transient ischemic attack (TIA), and cerebral infarction without residual deficits: Secondary | ICD-10-CM

## 2022-05-19 DIAGNOSIS — G319 Degenerative disease of nervous system, unspecified: Secondary | ICD-10-CM | POA: Diagnosis not present

## 2022-05-19 DIAGNOSIS — K259 Gastric ulcer, unspecified as acute or chronic, without hemorrhage or perforation: Secondary | ICD-10-CM

## 2022-05-19 DIAGNOSIS — E7800 Pure hypercholesterolemia, unspecified: Secondary | ICD-10-CM

## 2022-05-19 MED ORDER — QUETIAPINE FUMARATE 25 MG PO TABS: ORAL_TABLET | ORAL | 1 refills | Status: DC

## 2022-05-19 NOTE — Progress Notes (Signed)
Subjective:    Patient ID: Rachel Martinez, female    DOB: [birth year], 89 y.o.   MRN: [id number]  Patient here for  Chief Complaint  Patient presents with   Medical Management of Chronic Issues    HPI Here for scheduled follow up.  Was seen ER 4/15 - after a fall.  CT head and CT thoracic and lumbar spine - negative for acute injury.  Also evaluated 3/18 ER - fall.  CT - no acute injury.  Saw ortho 3/15 - right shoulder pain.  S/p injection. She is accompanied by her granddaughter.  History obtained from both of them - mostly the granddaughter.  Discussed recent multiple falls.  Discussed continued decline in her cognition and functioning.  She is eating.  Not sleeping at night.  More aggressive.  Increased confusion.  Is currently on zoloft and buspar. Does not feel is helping at night.  Describes hallucinations - visual - talking to people not present.  Feels needs something to help her rest/sleep.  Pt denies any pain currently.    Past Medical History:  Diagnosis Date   Anemia    Chicken pox    CVA (cerebral vascular accident) (HCC) 8/06   Depression    GERD (gastroesophageal reflux disease)    Heart palpitations    Hypercholesteremia    Hypertension    Stroke [facility])    Past Surgical History:  Procedure Laterality Date   APPENDECTOMY     Family History  Problem Relation Age of Onset   CVA Mother    Diabetes Father    Heart disease Father    Cancer Sister        breast    Diabetes Sister    Cancer Brother        stomach   Leukemia Brother    Lung disease Brother    Hypertension Sister    Cancer Sister        stomach   Diabetes Sister    Mental illness Brother        suicide   Breast cancer Paternal Aunt    Diabetes Son    Social History   Socioeconomic History   Marital status: Widowed    Spouse name: Not on file   Number of children: Not on file   Years of education: Not on file   Highest education level: Not on file  Occupational History   Not on file   Tobacco Use   Smoking status: Former    Packs/day: 0.25    Years: 30.00    Additional pack years: 0.00    Total pack years: 7.50    Types: Cigarettes    Quit date: 09/13/2003    Years since quitting: 18.7   Smokeless tobacco: Never  Vaping Use   Vaping Use: Never used  Substance and Sexual Activity   Alcohol use: No    Alcohol/week: 0.0 standard drinks of alcohol   Drug use: No   Sexual activity: Never  Other Topics Concern   Not on file  Social History Narrative   Not on file   Social Determinants of Health   Financial Resource Strain: Low Risk  (06/07/2021)   Overall Financial Resource Strain (CARDIA)    Difficulty of Paying Living Expenses: Not hard at all  Food Insecurity: No Food Insecurity (06/07/2021)   Hunger Vital Sign    Worried About Running Out of Food in the Last Year: Never true    Ran Out of Food in the Last  Year: Never true  Transportation Needs: No Transportation Needs (06/07/2021)   PRAPARE - Administrator, Civil Service (Medical): No    Lack of Transportation (Non-Medical): No  Physical Activity: Insufficiently Active (05/29/2018)   Exercise Vital Sign    Days of Exercise per Week: 2 days    Minutes of Exercise per Session: 10 min  Stress: No Stress Concern Present (06/07/2021)   [employer] of Occupational Health - Occupational Stress Questionnaire    Feeling of Stress : Not at all  Social Connections: Unknown (06/07/2021)   Social Connection and Isolation Panel [NHANES]    Frequency of Communication with Friends and Family: More than three times a week    Frequency of Social Gatherings with Friends and Family: Not on file    Attends Religious Services: Not on file    Active Member of Clubs or Organizations: Not on file    Attends Banker Meetings: Not on file    Marital Status: Not on file     Review of Systems  Constitutional:  Negative for fever and unexpected weight change.  HENT:  Negative for congestion and  sinus pressure.   Respiratory:  Negative for cough, chest tightness and shortness of breath.   Cardiovascular:  Negative for chest pain and palpitations.  Gastrointestinal:  Negative for abdominal pain, diarrhea, nausea and vomiting.  Genitourinary:  Negative for difficulty urinating and dysuria.  Musculoskeletal:  Negative for joint swelling and myalgias.  Skin:  Negative for color change and rash.  Neurological:        Denies headache.  Increased confusion.   Psychiatric/Behavioral:         Increased agitation.         Objective:     BP 122/72   Pulse 69   Temp 97.9 F (36.6 C)   Resp 16   Ht 5\' 2"  (1.575 m)   Wt 128 lb (58.1 kg)   SpO2 97%   BMI 23.41 kg/m  Wt Readings from Last 3 Encounters:  05/19/22 128 lb (58.1 kg)  05/09/22 129 lb 10.1 oz (58.8 kg)  03/10/22 129 lb 9.6 oz (58.8 kg)    Physical Exam Vitals reviewed.  Constitutional:      General: She is not in acute distress.    Appearance: Normal appearance.  HENT:     Head: Normocephalic and atraumatic.     Right Ear: External ear normal.     Left Ear: External ear normal.  Eyes:     General: No scleral icterus.       Right eye: No discharge.        Left eye: No discharge.     Conjunctiva/sclera: Conjunctivae normal.  Neck:     Thyroid: No thyromegaly.  Cardiovascular:     Rate and Rhythm: Normal rate and regular rhythm.  Pulmonary:     Effort: No respiratory distress.     Breath sounds: Normal breath sounds. No wheezing.  Abdominal:     General: Bowel sounds are normal.     Palpations: Abdomen is soft.     Tenderness: There is no abdominal tenderness.  Musculoskeletal:        General: No swelling or tenderness.     Cervical back: Neck supple. No tenderness.  Lymphadenopathy:     Cervical: No cervical adenopathy.  Skin:    Findings: No erythema or rash.  Neurological:     Mental Status: She is alert.  Psychiatric:  Mood and Affect: Mood normal.        Behavior: Behavior normal.       Outpatient Encounter Medications as of 05/19/2022  Medication Sig   QUEtiapine (SEROQUEL) 25 MG tablet 1/2 tablet q hs   busPIRone (BUSPAR) 5 MG tablet Take 1 tablet (5 mg total) by mouth 2 (two) times daily.   nitroGLYCERIN (NITROSTAT) 0.4 MG SL tablet Place 1 tablet (0.4 mg total) under the tongue every 5 (five) minutes as needed for chest pain.   sertraline (ZOLOFT) 50 MG tablet TAKE 1 TABLET(50 MG) BY MOUTH DAILY   [DISCONTINUED] propranolol (INDERAL) 10 MG tablet Take 1 tablet (10 mg total) by mouth 2 (two) times daily as needed (As needed for palpitations). (Patient not taking: Reported on 04/11/2022)   No facility-administered encounter medications on file as of 05/19/2022.     Lab Results  Component Value Date   WBC 6.2 05/19/2022   HGB 13.1 05/19/2022   HCT 38.5 05/19/2022   PLT 299.0 05/19/2022   GLUCOSE 115 (H) 05/19/2022   CHOL 227 (H) 11/06/2020   TRIG 114.0 11/06/2020   HDL 74.30 11/06/2020   LDLCALC 130 (H) 11/06/2020   ALT 11 05/19/2022   AST 23 05/19/2022   NA 141 05/19/2022   K 4.0 05/19/2022   CL 104 05/19/2022   CREATININE 1.16 05/19/2022   BUN 27 (H) 05/19/2022   CO2 28 05/19/2022   TSH 2.08 11/11/2021   HGBA1C 5.4 10/29/2018    CT HEAD WO CONTRAST ( )  Result Date: 05/09/2022 CLINICAL DATA:  Head trauma, minor (Age >= 65y); Back trauma, no prior imaging (Age >= 16y); Neck trauma (Age >= 65y) EXAM: CT HEAD WITHOUT CONTRAST CT CERVICAL SPINE WITHOUT CONTRAST CT THORACIC SPINE WITHOUT CONTRAST CT LUMBAR SPINE WITHOUT CONTRAST TECHNIQUE: Multidetector CT imaging of the head and cervical, thoracic and lumbar spine was performed following the standard protocol without intravenous contrast. Multiplanar CT image reconstructions of the cervical spine were also generated. RADIATION DOSE REDUCTION: This exam was performed according to the departmental dose-optimization program which includes automated exposure control, adjustment of the mA and/or kV according  to patient size and/or use of iterative reconstruction technique. COMPARISON:  None Available. FINDINGS: CT HEAD FINDINGS Brain: No evidence of acute infarction, hemorrhage, hydrocephalus, extra-axial collection or mass lesion/mass effect. Similar patchy white matter hypodensities, compatible with chronic microvascular ischemic disease. Similar cerebral atrophy. Vascular: Calcific atherosclerosis. Skull: No acute fracture. Sinuses/Orbits: No acute finding. CT CERVICAL SPINE FINDINGS Alignment: Mild through of C7 on T1, likely degenerative. Skull base and vertebrae: No evidence of acute fracture. Multilevel degenerative disc disease and facet/uncovertebral hypertrophy. Soft tissues and spinal canal: No prevertebral fluid or swelling. No visible canal hematoma. Disc levels: Multilevel degenerative disc disease and facet arthropathy. Upper chest: Biapical pleuroparenchymal scarring. CT THORACIC SPINE FINDINGS Alignment: Mild scoliosis.  No substantial sagittal subluxation Skull base and vertebrae: No evidence of acute fracture. Disc levels: Multilevel degenerative disc disease and facet arthropathy. Upper chest: Emphysema.  Aortic atherosclerosis. CT LUMBAR SPINE FINDINGS Alignment: No substantial sagittal subluxation.  Scoliosis. Skull base and vertebrae: Vertebral body heights are maintained. No evidence of acute fracture. Soft tissues and spinal canal: No prevertebral fluid or swelling. No visible canal hematoma. Disc levels: Multilevel degenerative disease and facet arthropathy, greatest in the lower lumbar spine. IMPRESSION: 1. No evidence of acute intracranial abnormality. Senescent changes. 2. No evidence of acute fracture or traumatic malalignment in the cervical, thoracic or lumbar spine. Multilevel degenerative change. 3. Aortic Atherosclerosis (ICD10-I70.0)  and Emphysema (ICD10-J43.9). Electronically Signed   By: Feliberto Harts M.D.   On: 05/09/2022 09:01   CT Cervical Spine Wo Contrast  Result Date:  05/09/2022 CLINICAL DATA:  Head trauma, minor (Age >= 65y); Back trauma, no prior imaging (Age >= 16y); Neck trauma (Age >= 65y) EXAM: CT HEAD WITHOUT CONTRAST CT CERVICAL SPINE WITHOUT CONTRAST CT THORACIC SPINE WITHOUT CONTRAST CT LUMBAR SPINE WITHOUT CONTRAST TECHNIQUE: Multidetector CT imaging of the head and cervical, thoracic and lumbar spine was performed following the standard protocol without intravenous contrast. Multiplanar CT image reconstructions of the cervical spine were also generated. RADIATION DOSE REDUCTION: This exam was performed according to the departmental dose-optimization program which includes automated exposure control, adjustment of the mA and/or kV according to patient size and/or use of iterative reconstruction technique. COMPARISON:  None Available. FINDINGS: CT HEAD FINDINGS Brain: No evidence of acute infarction, hemorrhage, hydrocephalus, extra-axial collection or mass lesion/mass effect. Similar patchy white matter hypodensities, compatible with chronic microvascular ischemic disease. Similar cerebral atrophy. Vascular: Calcific atherosclerosis. Skull: No acute fracture. Sinuses/Orbits: No acute finding. CT CERVICAL SPINE FINDINGS Alignment: Mild through of C7 on T1, likely degenerative. Skull base and vertebrae: No evidence of acute fracture. Multilevel degenerative disc disease and facet/uncovertebral hypertrophy. Soft tissues and spinal canal: No prevertebral fluid or swelling. No visible canal hematoma. Disc levels: Multilevel degenerative disc disease and facet arthropathy. Upper chest: Biapical pleuroparenchymal scarring. CT THORACIC SPINE FINDINGS Alignment: Mild scoliosis.  No substantial sagittal subluxation Skull base and vertebrae: No evidence of acute fracture. Disc levels: Multilevel degenerative disc disease and facet arthropathy. Upper chest: Emphysema.  Aortic atherosclerosis. CT LUMBAR SPINE FINDINGS Alignment: No substantial sagittal subluxation.  Scoliosis.  Skull base and vertebrae: Vertebral body heights are maintained. No evidence of acute fracture. Soft tissues and spinal canal: No prevertebral fluid or swelling. No visible canal hematoma. Disc levels: Multilevel degenerative disease and facet arthropathy, greatest in the lower lumbar spine. IMPRESSION: 1. No evidence of acute intracranial abnormality. Senescent changes. 2. No evidence of acute fracture or traumatic malalignment in the cervical, thoracic or lumbar spine. Multilevel degenerative change. 3. Aortic Atherosclerosis (ICD10-I70.0) and Emphysema (ICD10-J43.9). Electronically Signed   By: Feliberto Harts M.D.   On: 05/09/2022 09:01   CT Thoracic Spine Wo Contrast  Result Date: 05/09/2022 CLINICAL DATA:  Head trauma, minor (Age >= 65y); Back trauma, no prior imaging (Age >= 16y); Neck trauma (Age >= 65y) EXAM: CT HEAD WITHOUT CONTRAST CT CERVICAL SPINE WITHOUT CONTRAST CT THORACIC SPINE WITHOUT CONTRAST CT LUMBAR SPINE WITHOUT CONTRAST TECHNIQUE: Multidetector CT imaging of the head and cervical, thoracic and lumbar spine was performed following the standard protocol without intravenous contrast. Multiplanar CT image reconstructions of the cervical spine were also generated. RADIATION DOSE REDUCTION: This exam was performed according to the departmental dose-optimization program which includes automated exposure control, adjustment of the mA and/or kV according to patient size and/or use of iterative reconstruction technique. COMPARISON:  None Available. FINDINGS: CT HEAD FINDINGS Brain: No evidence of acute infarction, hemorrhage, hydrocephalus, extra-axial collection or mass lesion/mass effect. Similar patchy white matter hypodensities, compatible with chronic microvascular ischemic disease. Similar cerebral atrophy. Vascular: Calcific atherosclerosis. Skull: No acute fracture. Sinuses/Orbits: No acute finding. CT CERVICAL SPINE FINDINGS Alignment: Mild through of C7 on T1, likely degenerative. Skull  base and vertebrae: No evidence of acute fracture. Multilevel degenerative disc disease and facet/uncovertebral hypertrophy. Soft tissues and spinal canal: No prevertebral fluid or swelling. No visible canal hematoma. Disc levels: Multilevel degenerative disc disease and  facet arthropathy. Upper chest: Biapical pleuroparenchymal scarring. CT THORACIC SPINE FINDINGS Alignment: Mild scoliosis.  No substantial sagittal subluxation Skull base and vertebrae: No evidence of acute fracture. Disc levels: Multilevel degenerative disc disease and facet arthropathy. Upper chest: Emphysema.  Aortic atherosclerosis. CT LUMBAR SPINE FINDINGS Alignment: No substantial sagittal subluxation.  Scoliosis. Skull base and vertebrae: Vertebral body heights are maintained. No evidence of acute fracture. Soft tissues and spinal canal: No prevertebral fluid or swelling. No visible canal hematoma. Disc levels: Multilevel degenerative disease and facet arthropathy, greatest in the lower lumbar spine. IMPRESSION: 1. No evidence of acute intracranial abnormality. Senescent changes. 2. No evidence of acute fracture or traumatic malalignment in the cervical, thoracic or lumbar spine. Multilevel degenerative change. 3. Aortic Atherosclerosis (ICD10-I70.0) and Emphysema (ICD10-J43.9). Electronically Signed   By: Feliberto Harts M.D.   On: 05/09/2022 09:01   CT Lumbar Spine Wo Contrast  Result Date: 05/09/2022 CLINICAL DATA:  Head trauma, minor (Age >= 65y); Back trauma, no prior imaging (Age >= 16y); Neck trauma (Age >= 65y) EXAM: CT HEAD WITHOUT CONTRAST CT CERVICAL SPINE WITHOUT CONTRAST CT THORACIC SPINE WITHOUT CONTRAST CT LUMBAR SPINE WITHOUT CONTRAST TECHNIQUE: Multidetector CT imaging of the head and cervical, thoracic and lumbar spine was performed following the standard protocol without intravenous contrast. Multiplanar CT image reconstructions of the cervical spine were also generated. RADIATION DOSE REDUCTION: This exam was  performed according to the departmental dose-optimization program which includes automated exposure control, adjustment of the mA and/or kV according to patient size and/or use of iterative reconstruction technique. COMPARISON:  None Available. FINDINGS: CT HEAD FINDINGS Brain: No evidence of acute infarction, hemorrhage, hydrocephalus, extra-axial collection or mass lesion/mass effect. Similar patchy white matter hypodensities, compatible with chronic microvascular ischemic disease. Similar cerebral atrophy. Vascular: Calcific atherosclerosis. Skull: No acute fracture. Sinuses/Orbits: No acute finding. CT CERVICAL SPINE FINDINGS Alignment: Mild through of C7 on T1, likely degenerative. Skull base and vertebrae: No evidence of acute fracture. Multilevel degenerative disc disease and facet/uncovertebral hypertrophy. Soft tissues and spinal canal: No prevertebral fluid or swelling. No visible canal hematoma. Disc levels: Multilevel degenerative disc disease and facet arthropathy. Upper chest: Biapical pleuroparenchymal scarring. CT THORACIC SPINE FINDINGS Alignment: Mild scoliosis.  No substantial sagittal subluxation Skull base and vertebrae: No evidence of acute fracture. Disc levels: Multilevel degenerative disc disease and facet arthropathy. Upper chest: Emphysema.  Aortic atherosclerosis. CT LUMBAR SPINE FINDINGS Alignment: No substantial sagittal subluxation.  Scoliosis. Skull base and vertebrae: Vertebral body heights are maintained. No evidence of acute fracture. Soft tissues and spinal canal: No prevertebral fluid or swelling. No visible canal hematoma. Disc levels: Multilevel degenerative disease and facet arthropathy, greatest in the lower lumbar spine. IMPRESSION: 1. No evidence of acute intracranial abnormality. Senescent changes. 2. No evidence of acute fracture or traumatic malalignment in the cervical, thoracic or lumbar spine. Multilevel degenerative change. 3. Aortic Atherosclerosis (ICD10-I70.0) and  Emphysema (ICD10-J43.9). Electronically Signed   By: Feliberto Harts M.D.   On: 05/09/2022 09:01       Assessment & Plan:  Disorientation Assessment & Plan: With known dementia.  Worsening recently.  Increased falls.  Increased confusion.  Up at night.  Eating better - with family.  Family very involved.  Will continue zoloft in the am.  Will start seroquel - low dose at night. Titrate up if tolerated.  Follow.  Change buspar to q am and pending seroquel effectiveness - may discontinue.  Follow.  Call with update.  Discussed possible referral for palliative care vs  hospice.  Follow.   Orders: -     CBC with Differential/Platelet -     Basic metabolic panel -     Hepatic function panel  Anxiety Assessment & Plan: Discussed.  Behavior as outlined.  Continue zoloft.  Add seroquel in the evening - to help with increased agitation,etc. Call with update.     Cerebral atrophy [facility]) Assessment & Plan: Noted on previous CT scan.  Was on aricept.  Per report, not taking.  Follow.     Frequent falls Assessment & Plan: Discussed recent fall.  No residual injuries.  Discussed palliative care/hospice care.  Will notify me if agreeable.     Gastric ulcer, unspecified chronicity, unspecified whether gastric ulcer hemorrhage or perforation present Assessment & Plan: Has a history of gastric ulcer.  On protonix.  Doing well.  Follow cbc.    History of CVA (cerebrovascular accident) Assessment & Plan: ECASA 81mg  q day.  Follow.  Off plavix.    Hypercholesterolemia Assessment & Plan: Off pravastatin.  Age 23.  Hold on starting new medication at this time.     Iron deficiency anemia, unspecified iron deficiency anemia type Assessment & Plan: Has seen hematology previously - required iron infusion.  Follow cbc and iron studies.    Other orders -     QUEtiapine Fumarate; 1/2 tablet q hs  Dispense: 30 tablet; Refill: 1     Dale [place], MD

## 2022-05-19 NOTE — Patient Instructions (Signed)
Stop pm buspar.  Start 1/2 seroquel before bed.

## 2022-05-20 LAB — BASIC METABOLIC PANEL
BUN: 27 mg/dL — ABNORMAL HIGH (ref 6–23)
CO2: 28 mEq/L (ref 19–32)
Calcium: 9.2 mg/dL (ref 8.4–10.5)
Chloride: 104 mEq/L (ref 96–112)
Creatinine, Ser: 1.16 mg/dL (ref 0.40–1.20)
GFR: 40.73 mL/min — ABNORMAL LOW (ref >60.00)
Glucose, Bld: 115 mg/dL — ABNORMAL HIGH (ref 70–99)
Potassium: 4 mEq/L (ref 3.5–5.1)
Sodium: 141 mEq/L (ref 135–145)

## 2022-05-20 LAB — CBC WITH DIFFERENTIAL/PLATELET
Basophils Absolute: 0.1 10*3/uL (ref 0.0–0.1)
Basophils Relative: 1 % (ref 0.0–3.0)
Eosinophils Absolute: 0.1 10*3/uL (ref 0.0–0.7)
Eosinophils Relative: 2.1 % (ref 0.0–5.0)
HCT: 38.5 % (ref 36.0–46.0)
Hemoglobin: 13.1 g/dL (ref 12.0–15.0)
Lymphocytes Relative: 18.2 % (ref 12.0–46.0)
Lymphs Abs: 1.1 10*3/uL (ref 0.7–4.0)
MCHC: 33.9 g/dL (ref 30.0–36.0)
MCV: 91.8 fl (ref 78.0–100.0)
Monocytes Absolute: 0.6 10*3/uL (ref 0.1–1.0)
Monocytes Relative: 8.9 % (ref 3.0–12.0)
Neutro Abs: 4.3 10*3/uL (ref 1.4–7.7)
Neutrophils Relative %: 69.8 % (ref 43.0–77.0)
Platelets: 299 10*3/uL (ref 150.0–400.0)
RBC: 4.19 Mil/uL (ref 3.87–5.11)
RDW: 15.3 % (ref 11.5–15.5)
WBC: 6.2 10*3/uL (ref 4.0–10.5)

## 2022-05-20 LAB — HEPATIC FUNCTION PANEL
ALT: 11 U/L (ref 0–35)
AST: 23 U/L (ref 0–37)
Albumin: 3.9 g/dL (ref 3.5–5.2)
Alkaline Phosphatase: 114 U/L (ref 39–117)
Bilirubin, Direct: 0.1 mg/dL (ref 0.0–0.3)
Total Bilirubin: 0.4 mg/dL (ref 0.2–1.2)
Total Protein: 7 g/dL (ref 6.0–8.3)

## 2022-05-22 ENCOUNTER — Encounter: Payer: Self-pay | Admitting: Internal Medicine

## 2022-05-22 NOTE — Assessment & Plan Note (Signed)
Has a history of gastric ulcer.  On protonix.  Doing well.  Follow cbc.  

## 2022-05-22 NOTE — Assessment & Plan Note (Signed)
Discussed.  Behavior as outlined.  Continue zoloft.  Add seroquel in the evening - to help with increased agitation,etc. Call with update.

## 2022-05-22 NOTE — Assessment & Plan Note (Signed)
ECASA 81mg q day.  Follow.  Off plavix.  

## 2022-05-22 NOTE — Assessment & Plan Note (Signed)
Noted on previous CT scan.  Was on aricept.  Per report, not taking.  Follow.   

## 2022-05-22 NOTE — Assessment & Plan Note (Signed)
Has seen hematology previously - required iron infusion.  Follow cbc and iron studies.

## 2022-05-22 NOTE — Assessment & Plan Note (Signed)
Discussed recent fall.  No residual injuries.  Discussed palliative care/hospice care.  Will notify me if agreeable.

## 2022-05-22 NOTE — Assessment & Plan Note (Signed)
With known dementia.  Worsening recently.  Increased falls.  Increased confusion.  Up at night.  Eating better - with family.  Family very involved.  Will continue zoloft in the am.  Will start seroquel - low dose at night. Titrate up if tolerated.  Follow.  Change buspar to q am and pending seroquel effectiveness - may discontinue.  Follow.  Call with update.  Discussed possible referral for palliative care vs hospice.  Follow.

## 2022-05-22 NOTE — Assessment & Plan Note (Signed)
Off pravastatin.  Age 89.  Hold on starting new medication at this time.   

## 2022-05-23 ENCOUNTER — Other Ambulatory Visit: Payer: Self-pay | Admitting: Internal Medicine

## 2022-05-23 ENCOUNTER — Encounter: Payer: Self-pay | Admitting: *Deleted

## 2022-05-23 NOTE — Telephone Encounter (Signed)
-----   Message from Dale [place], MD sent at 05/21/2022 11:59 AM EDT ----- Please call and notify - kidney function decreased some from previous checks.  Continue to stay hydrated.  White blood cell count, hgb and liver function tests are wnl.  I had spoke to granddaughter about palliative care/hospice.  Let me know if they desire referral.

## 2022-05-23 NOTE — Addendum Note (Signed)
Addended by: Charm Barges on: 05/23/2022 09:56 PM   Modules accepted: Orders

## 2022-05-23 NOTE — Telephone Encounter (Signed)
Left voicemail & sent mychart mesage

## 2022-05-25 DIAGNOSIS — F02811 Dementia in other diseases classified elsewhere, unspecified severity, with agitation: Secondary | ICD-10-CM | POA: Diagnosis not present

## 2022-05-25 DIAGNOSIS — F0283 Dementia in other diseases classified elsewhere, unspecified severity, with mood disturbance: Secondary | ICD-10-CM | POA: Diagnosis not present

## 2022-05-25 DIAGNOSIS — F0282 Dementia in other diseases classified elsewhere, unspecified severity, with psychotic disturbance: Secondary | ICD-10-CM | POA: Diagnosis not present

## 2022-05-25 DIAGNOSIS — G311 Senile degeneration of brain, not elsewhere classified: Secondary | ICD-10-CM | POA: Diagnosis not present

## 2022-05-25 DIAGNOSIS — D509 Iron deficiency anemia, unspecified: Secondary | ICD-10-CM | POA: Diagnosis not present

## 2022-05-25 DIAGNOSIS — I10 Essential (primary) hypertension: Secondary | ICD-10-CM | POA: Diagnosis not present

## 2022-05-25 DIAGNOSIS — Z8673 Personal history of transient ischemic attack (TIA), and cerebral infarction without residual deficits: Secondary | ICD-10-CM | POA: Diagnosis not present

## 2022-05-25 DIAGNOSIS — G3189 Other specified degenerative diseases of nervous system: Secondary | ICD-10-CM | POA: Diagnosis not present

## 2022-05-25 DIAGNOSIS — E785 Hyperlipidemia, unspecified: Secondary | ICD-10-CM | POA: Diagnosis not present

## 2022-05-25 NOTE — Telephone Encounter (Signed)
Rx ok'd for buspar 5mg  q day.  #30 with one refill.

## 2022-05-27 DIAGNOSIS — F0283 Dementia in other diseases classified elsewhere, unspecified severity, with mood disturbance: Secondary | ICD-10-CM | POA: Diagnosis not present

## 2022-05-27 DIAGNOSIS — G311 Senile degeneration of brain, not elsewhere classified: Secondary | ICD-10-CM | POA: Diagnosis not present

## 2022-05-27 DIAGNOSIS — D509 Iron deficiency anemia, unspecified: Secondary | ICD-10-CM | POA: Diagnosis not present

## 2022-05-27 DIAGNOSIS — F0282 Dementia in other diseases classified elsewhere, unspecified severity, with psychotic disturbance: Secondary | ICD-10-CM | POA: Diagnosis not present

## 2022-05-27 DIAGNOSIS — F02811 Dementia in other diseases classified elsewhere, unspecified severity, with agitation: Secondary | ICD-10-CM | POA: Diagnosis not present

## 2022-05-27 DIAGNOSIS — G3189 Other specified degenerative diseases of nervous system: Secondary | ICD-10-CM | POA: Diagnosis not present

## 2022-05-28 DIAGNOSIS — F0282 Dementia in other diseases classified elsewhere, unspecified severity, with psychotic disturbance: Secondary | ICD-10-CM | POA: Diagnosis not present

## 2022-05-28 DIAGNOSIS — F02811 Dementia in other diseases classified elsewhere, unspecified severity, with agitation: Secondary | ICD-10-CM | POA: Diagnosis not present

## 2022-05-28 DIAGNOSIS — D509 Iron deficiency anemia, unspecified: Secondary | ICD-10-CM | POA: Diagnosis not present

## 2022-05-28 DIAGNOSIS — G311 Senile degeneration of brain, not elsewhere classified: Secondary | ICD-10-CM | POA: Diagnosis not present

## 2022-05-28 DIAGNOSIS — F0283 Dementia in other diseases classified elsewhere, unspecified severity, with mood disturbance: Secondary | ICD-10-CM | POA: Diagnosis not present

## 2022-05-28 DIAGNOSIS — G3189 Other specified degenerative diseases of nervous system: Secondary | ICD-10-CM | POA: Diagnosis not present

## 2022-05-30 ENCOUNTER — Telehealth: Payer: Self-pay | Admitting: Internal Medicine

## 2022-05-30 NOTE — Telephone Encounter (Signed)
Copied from CRM [id number]. Topic: Medicare AWV >> May 30, 2022  1:39 PM Payton Doughty wrote: Reason for CRM: Called patient to schedule Medicare Annual Wellness Visit (AWV). Left message for patient to call back and schedule Medicare Annual Wellness Visit (AWV).  Last date of AWV: 06/07/21  Please schedule an appointment at any time with [facility].  If any questions, please contact me.  Thank you ,  Verlee Rossetti; Care Guide Ambulatory Clinical Support [place] [facility] Direct Dial: [phone]

## 2022-05-30 NOTE — Telephone Encounter (Signed)
Contacted Lupita Shutter to schedule their annual wellness visit. Appointment made for 06/10/2022.  Verlee Rossetti; Care Guide Ambulatory Clinical Support [place] [facility] Direct Dial: [phone]

## 2022-06-02 DIAGNOSIS — G311 Senile degeneration of brain, not elsewhere classified: Secondary | ICD-10-CM | POA: Diagnosis not present

## 2022-06-02 DIAGNOSIS — F02811 Dementia in other diseases classified elsewhere, unspecified severity, with agitation: Secondary | ICD-10-CM | POA: Diagnosis not present

## 2022-06-02 DIAGNOSIS — D509 Iron deficiency anemia, unspecified: Secondary | ICD-10-CM | POA: Diagnosis not present

## 2022-06-02 DIAGNOSIS — F0283 Dementia in other diseases classified elsewhere, unspecified severity, with mood disturbance: Secondary | ICD-10-CM | POA: Diagnosis not present

## 2022-06-02 DIAGNOSIS — F0282 Dementia in other diseases classified elsewhere, unspecified severity, with psychotic disturbance: Secondary | ICD-10-CM | POA: Diagnosis not present

## 2022-06-02 DIAGNOSIS — G3189 Other specified degenerative diseases of nervous system: Secondary | ICD-10-CM | POA: Diagnosis not present

## 2022-06-07 DIAGNOSIS — G311 Senile degeneration of brain, not elsewhere classified: Secondary | ICD-10-CM | POA: Diagnosis not present

## 2022-06-07 DIAGNOSIS — F0283 Dementia in other diseases classified elsewhere, unspecified severity, with mood disturbance: Secondary | ICD-10-CM | POA: Diagnosis not present

## 2022-06-07 DIAGNOSIS — D509 Iron deficiency anemia, unspecified: Secondary | ICD-10-CM | POA: Diagnosis not present

## 2022-06-07 DIAGNOSIS — F0282 Dementia in other diseases classified elsewhere, unspecified severity, with psychotic disturbance: Secondary | ICD-10-CM | POA: Diagnosis not present

## 2022-06-07 DIAGNOSIS — F02811 Dementia in other diseases classified elsewhere, unspecified severity, with agitation: Secondary | ICD-10-CM | POA: Diagnosis not present

## 2022-06-07 DIAGNOSIS — G3189 Other specified degenerative diseases of nervous system: Secondary | ICD-10-CM | POA: Diagnosis not present

## 2022-06-09 DIAGNOSIS — F02811 Dementia in other diseases classified elsewhere, unspecified severity, with agitation: Secondary | ICD-10-CM | POA: Diagnosis not present

## 2022-06-09 DIAGNOSIS — G311 Senile degeneration of brain, not elsewhere classified: Secondary | ICD-10-CM | POA: Diagnosis not present

## 2022-06-09 DIAGNOSIS — F0282 Dementia in other diseases classified elsewhere, unspecified severity, with psychotic disturbance: Secondary | ICD-10-CM | POA: Diagnosis not present

## 2022-06-09 DIAGNOSIS — D509 Iron deficiency anemia, unspecified: Secondary | ICD-10-CM | POA: Diagnosis not present

## 2022-06-09 DIAGNOSIS — F0283 Dementia in other diseases classified elsewhere, unspecified severity, with mood disturbance: Secondary | ICD-10-CM | POA: Diagnosis not present

## 2022-06-09 DIAGNOSIS — G3189 Other specified degenerative diseases of nervous system: Secondary | ICD-10-CM | POA: Diagnosis not present

## 2022-06-10 ENCOUNTER — Ambulatory Visit (INDEPENDENT_AMBULATORY_CARE_PROVIDER_SITE_OTHER): Payer: Medicare Other

## 2022-06-10 DIAGNOSIS — Z0000 Encounter for general adult medical examination without abnormal findings: Secondary | ICD-10-CM | POA: Diagnosis not present

## 2022-06-10 NOTE — Patient Instructions (Signed)

## 2022-06-10 NOTE — Progress Notes (Signed)
I connected with the patient son Rachel Martinez,  NOTA BEW on 06/10/22 by a audio enabled telemedicine application and verified that I am speaking with the correct person using two identifiers. The pt is unable to provide any medical history because of her severe dementia. I spoke with the patient son Rachel Martinez. The patient is currently receiving Hospice care in her home for her worsening dementia. Patient Location: Home  Provider Location: Home Office  I discussed the limitations of evaluation and management by telemedicine. The patient expressed understanding and agreed to proceed.   Rachel Martinez is a 89 y.o. female who presents for Medicare Annual (Subsequent) preventive examination.  Review of Systems    Per HPI unless specifically indicated below.  Cardiac Risk Factors include: advanced age (>40men, >68 women);female gender, Hypertension, and Hypercholesterolemia.           Objective:       05/19/2022    2:03 PM 05/09/2022   10:14 AM 05/09/2022    9:09 AM  Vitals with BMI  Height 5\' 2"     Weight 128 lbs    BMI 23.41    Systolic 122 140 161  Diastolic 72 98 81  Pulse 69 64     There were no vitals filed for this visit. There is no height or weight on file to calculate BMI.     05/09/2022    7:59 AM 06/07/2021   12:37 PM 04/13/2021    9:47 PM 04/11/2021    1:21 PM 06/01/2020    1:31 PM 05/30/2019    1:52 PM 07/18/2018    7:55 PM  Advanced Directives  Does Patient Have a Medical Advance Directive? No No Unable to assess, patient is non-responsive or altered mental status No No No No  Would patient like information on creating a medical advance directive?  No - Patient declined  No - Patient declined No - Patient declined No - Patient declined No - Patient declined    Current Medications (verified) Outpatient Encounter Medications as of 06/10/2022  Medication Sig   busPIRone (BUSPAR) 5 MG tablet Take 1 tablet (5 mg total) by mouth daily.   QUEtiapine  (SEROQUEL) 25 MG tablet 1/2 tablet q hs   sertraline (ZOLOFT) 50 MG tablet TAKE 1 TABLET(50 MG) BY MOUTH DAILY   nitroGLYCERIN (NITROSTAT) 0.4 MG SL tablet Place 1 tablet (0.4 mg total) under the tongue every 5 (five) minutes as needed for chest pain.   No facility-administered encounter medications on file as of 06/10/2022.    Allergies (verified) Amoxicillin-pot clavulanate, Ciprofloxacin, Codeine sulfate, Imipenem, and Metronidazole   History: Past Medical History:  Diagnosis Date   Anemia    Chicken pox    CVA (cerebral vascular accident) (HCC) 8/06   Depression    GERD (gastroesophageal reflux disease)    Heart palpitations    Hypercholesteremia    Hypertension    Stroke [facility])    Past Surgical History:  Procedure Laterality Date   APPENDECTOMY     Family History  Problem Relation Age of Onset   CVA Mother    Diabetes Father    Heart disease Father    Cancer Sister        breast    Diabetes Sister    Cancer Brother        stomach   Leukemia Brother    Lung disease Brother    Hypertension Sister    Cancer Sister  stomach   Diabetes Sister    Mental illness Brother        suicide   Breast cancer Paternal Aunt    Diabetes Son    Social History   Socioeconomic History   Marital status: Widowed    Spouse name: Not on file   Number of children: 3   Years of education: Not on file   Highest education level: Not on file  Occupational History   Occupation: Retired  Tobacco Use   Smoking status: Former    Packs/day: 0.25    Years: 30.00    Additional pack years: 0.00    Total pack years: 7.50    Types: Cigarettes    Quit date: 09/13/2003    Years since quitting: 18.7   Smokeless tobacco: Never  Vaping Use   Vaping Use: Never used  Substance and Sexual Activity   Alcohol use: No    Alcohol/week: 0.0 standard drinks of alcohol   Drug use: No   Sexual activity: Never  Other Topics Concern   Not on file  Social History Narrative   Not on file    Social Determinants of Health   Financial Resource Strain: Low Risk  (06/10/2022)   Overall Financial Resource Strain (CARDIA)    Difficulty of Paying Living Expenses: Not hard at all  Food Insecurity: No Food Insecurity (06/10/2022)   Hunger Vital Sign    Worried About Running Out of Food in the Last Year: Never true    Ran Out of Food in the Last Year: Never true  Transportation Needs: No Transportation Needs (06/10/2022)   PRAPARE - Administrator, Civil Service (Medical): No    Lack of Transportation (Non-Medical): No  Physical Activity: Inactive (06/10/2022)   Exercise Vital Sign    Days of Exercise per Week: 0 days    Minutes of Exercise per Session: 0 min  Stress: No Stress Concern Present (06/10/2022)   [employer] of Occupational Health - Occupational Stress Questionnaire    Feeling of Stress : Not at all  Social Connections: Socially Isolated (06/10/2022)   Social Connection and Isolation Panel [NHANES]    Frequency of Communication with Friends and Family: Never    Frequency of Social Gatherings with Friends and Family: More than three times a week    Attends Religious Services: Never    Database administrator or Organizations: No    Attends Banker Meetings: Never    Marital Status: Widowed    Tobacco Counseling Counseling given: No   Clinical Intake:  Pre-visit preparation completed: No  Pain : No/denies pain     Nutritional Status: BMI of 19-24  Normal Nutritional Risks: Unintentional weight gain Diabetes: No     Diabetic?No          Activities of Daily Living    06/10/2022    1:39 PM  In your present state of health, do you have any difficulty performing the following activities:  Hearing? 0  Vision? 1  Difficulty concentrating or making decisions? 1  Walking or climbing stairs? 1  Dressing or bathing? 1  Doing errands, shopping? 1    Patient Care Team: Dale Enon, MD as PCP - General (Internal  Medicine) Antonieta Iba, MD as PCP - Cardiology (Cardiology)  Indicate any recent Medical Services you may have received from other than [facility] providers in the past year (date may be approximate).     Assessment:   This is a routine wellness examination  for [employer].  Hearing/Vision screen Son denies any hearing issues. Denies any vision changes.  Dietary issues and exercise activities discussed: Current Exercise Habits: The patient does not participate in regular exercise at present, Exercise limited by: psychological condition(s)   Goals Addressed   None    Depression Screen    06/10/2022    1:38 PM 03/10/2022    4:17 PM 11/11/2021    2:07 PM 08/17/2021    4:17 PM 06/23/2021   12:00 PM 06/07/2021   12:39 PM 11/06/2020    9:05 AM  PHQ 2/9 Scores  PHQ - 2 Score 0 0 5 0 2 0 0  PHQ- 9 Score   16  4      Fall Risk    06/10/2022    1:38 PM 03/10/2022    4:17 PM 11/11/2021    2:07 PM 08/17/2021    4:17 PM 06/23/2021   12:00 PM  Fall Risk   Falls in the past year? 1 1 1 1 1   Number falls in past yr: 1 0 1 1 0  Injury with Fall? 1 0 0 1 0  Risk for fall due to : Impaired balance/gait;Mental status change;Impaired mobility No Fall Risks History of fall(s);Impaired balance/gait History of fall(s) History of fall(s);Impaired balance/gait;Impaired mobility;Mental status change  Follow up Falls evaluation completed Falls evaluation completed Falls evaluation completed Falls evaluation completed Falls evaluation completed    FALL RISK PREVENTION PERTAINING TO THE HOME:  Any stairs in or around the home? Yes  If so, are there any without handrails? No  Home free of loose throw rugs in walkways, pet beds, electrical cords, etc? Yes Adequate lighting in your home to reduce risk of falls? Yes   ASSISTIVE DEVICES UTILIZED TO PREVENT FALLS:  Life alert? No  Use of a cane, walker or w/c? Yes  Grab bars in the bathroom? No  Shower chair or bench in shower? No  Elevated toilet seat  or a handicapped toilet? Yes   TIMED UP AND GO:  Was the test performed? Unable to perform, virtual appointment   Cognitive Function: Moderate dementia, Increased confusion, worsening dementia. Unable to evaluate.     06/19/2020    2:56 PM  MMSE - Mini Mental State Exam  Orientation to time 0  Orientation to Place 5  Registration 0  Attention/ Calculation 0  Recall 0  Language- name 2 objects 2  Language- repeat 0  Language- follow 3 step command 2  Language- read & follow direction 0  Write a sentence 0  Copy design 0  Total score 9        05/30/2019    1:55 PM 05/29/2018    4:42 PM 05/25/2017    1:39 PM 05/24/2016    1:46 PM  6CIT Screen  What Year? 0 points 0 points 0 points 0 points  What month? 3 points 0 points 0 points 0 points  What time? 0 points 0 points 0 points 0 points  Count back from 20 0 points 0 points 0 points 0 points  Months in reverse  0 points 0 points 0 points  Repeat phrase  0 points 0 points 0 points  Total Score  0 points 0 points 0 points    Immunizations Immunization History  Administered Date(s) Administered   Fluad Quad(high Dose 65+) 10/26/2018, 11/06/2020   Influenza Split 11/23/2011, 11/12/2012   Influenza, High Dose Seasonal PF 10/29/2015, 10/23/2016, 12/08/2017   Influenza,inj,Quad PF,6+ Mos 11/20/2013   PFIZER(Purple Top)SARS-COV-2 Vaccination 11/20/2019,  12/13/2019   PPD Test 08/17/2021   Pneumococcal Polysaccharide-23 10/23/2016    TDAP status: Due, Education has been provided regarding the importance of this vaccine. Advised may receive this vaccine at local pharmacy or Health Dept. Aware to provide a copy of the vaccination record if obtained from local pharmacy or Health Dept. Verbalized acceptance and understanding.  Flu Vaccine status: Up to date  Pneumococcal vaccine status: Due, Education has been provided regarding the importance of this vaccine. Advised may receive this vaccine at local pharmacy or Health Dept. Aware to  provide a copy of the vaccination record if obtained from local pharmacy or Health Dept. Verbalized acceptance and understanding.  Covid-19 vaccine status: Information provided on how to obtain vaccines.   Qualifies for Shingles Vaccine? Yes   Zostavax completed No   Shingrix Completed?: No.    Education has been provided regarding the importance of this vaccine. Patient has been advised to call insurance company to determine out of pocket expense if they have not yet received this vaccine. Advised may also receive vaccine at local pharmacy or Health Dept. Verbalized acceptance and understanding.  Screening Tests Health Maintenance  Topic Date Due   DTaP/Tdap/Td (1 - Tdap) Never done   Zoster Vaccines- Shingrix (1 of 2) Never done   DEXA SCAN  Never done   Pneumonia Vaccine 54+ Years old (2 of 2 - PCV) 10/23/2017   COVID-19 Vaccine (3 - 2023-24 season) 09/24/2021   INFLUENZA VACCINE  08/25/2022   HPV VACCINES  Aged Out   MAMMOGRAM  Discontinued    Health Maintenance  Health Maintenance Due  Topic Date Due   DTaP/Tdap/Td (1 - Tdap) Never done   Zoster Vaccines- Shingrix (1 of 2) Never done   DEXA SCAN  Never done   Pneumonia Vaccine 49+ Years old (2 of 2 - PCV) 10/23/2017   COVID-19 Vaccine (3 - 2023-24 season) 09/24/2021    Colorectal cancer screening: No longer required.   Mammogram status: No longer required due to age.  DEXA Scan: overdue   Lung Cancer Screening: (Low Dose CT Chest recommended if Age 78-80 years, 30 pack-year currently smoking OR have quit w/in 15years.) does not qualify.   Lung Cancer Screening Referral: not applicable  Additional Screening:  Hepatitis C Screening: does qualify  Vision Screening: Recommended annual ophthalmology exams for early detection of glaucoma and other disorders of the eye. Is the patient up to date with their annual eye exam?  No Who is the provider or what is the name of the office in which the patient attends annual eye  exams?  If pt is not established with a provider, would they like to be referred to a provider to establish care? No .   Dental Screening: Recommended annual dental exams for proper oral hygiene  Community Resource Referral / Chronic Care Management: CRR required this visit?  No   CCM required this visit?  No      Plan:     I have personally reviewed and noted the following in the patient's chart:   Medical and social history Use of alcohol, tobacco or illicit drugs  Current medications and supplements including opioid prescriptions. Patient is not currently taking opioid prescriptions. Functional ability and status Nutritional status Physical activity Advanced directives List of other physicians Hospitalizations, surgeries, and ER visits in previous 12 months Vitals Screenings to include cognitive, depression, and falls Referrals and appointments  In addition, I have reviewed and discussed with patient certain preventive protocols, quality metrics,  and best practice recommendations. A written personalized care plan for preventive services as well as general preventive health recommendations were provided to patient.    Ms. Brekken , Thank you for taking time to come for your Medicare Wellness Visit. I appreciate your ongoing commitment to your health goals. Please review the following plan we discussed and let me know if I can assist you in the future.   These are the goals we discussed:  Goals       Healthy Lifestyle  (pt-stated)      Drink plenty of fluids Eat 2 good meals daily Stay active        This is a list of the screening recommended for you and due dates:  Health Maintenance  Topic Date Due   DTaP/Tdap/Td vaccine (1 - Tdap) Never done   Zoster (Shingles) Vaccine (1 of 2) Never done   DEXA scan (bone density measurement)  Never done   Pneumonia Vaccine (2 of 2 - PCV) 10/23/2017   COVID-19 Vaccine (3 - 2023-24 season) 09/24/2021   Flu Shot  08/25/2022    HPV Vaccine  Aged Out   Mammogram  Discontinued      Lonna Cobb, [place]   06/10/2022   Nurse Notes: Approximately 30 minute Non-Face -To-Face Medicare Wellness Visit

## 2022-06-16 DIAGNOSIS — F0283 Dementia in other diseases classified elsewhere, unspecified severity, with mood disturbance: Secondary | ICD-10-CM | POA: Diagnosis not present

## 2022-06-16 DIAGNOSIS — G311 Senile degeneration of brain, not elsewhere classified: Secondary | ICD-10-CM | POA: Diagnosis not present

## 2022-06-16 DIAGNOSIS — D509 Iron deficiency anemia, unspecified: Secondary | ICD-10-CM | POA: Diagnosis not present

## 2022-06-16 DIAGNOSIS — F0282 Dementia in other diseases classified elsewhere, unspecified severity, with psychotic disturbance: Secondary | ICD-10-CM | POA: Diagnosis not present

## 2022-06-16 DIAGNOSIS — F02811 Dementia in other diseases classified elsewhere, unspecified severity, with agitation: Secondary | ICD-10-CM | POA: Diagnosis not present

## 2022-06-16 DIAGNOSIS — G3189 Other specified degenerative diseases of nervous system: Secondary | ICD-10-CM | POA: Diagnosis not present

## 2022-06-23 DIAGNOSIS — G311 Senile degeneration of brain, not elsewhere classified: Secondary | ICD-10-CM | POA: Diagnosis not present

## 2022-06-23 DIAGNOSIS — G3189 Other specified degenerative diseases of nervous system: Secondary | ICD-10-CM | POA: Diagnosis not present

## 2022-06-23 DIAGNOSIS — D509 Iron deficiency anemia, unspecified: Secondary | ICD-10-CM | POA: Diagnosis not present

## 2022-06-23 DIAGNOSIS — F0283 Dementia in other diseases classified elsewhere, unspecified severity, with mood disturbance: Secondary | ICD-10-CM | POA: Diagnosis not present

## 2022-06-23 DIAGNOSIS — F0282 Dementia in other diseases classified elsewhere, unspecified severity, with psychotic disturbance: Secondary | ICD-10-CM | POA: Diagnosis not present

## 2022-06-23 DIAGNOSIS — F02811 Dementia in other diseases classified elsewhere, unspecified severity, with agitation: Secondary | ICD-10-CM | POA: Diagnosis not present

## 2022-06-25 DIAGNOSIS — E785 Hyperlipidemia, unspecified: Secondary | ICD-10-CM | POA: Diagnosis not present

## 2022-06-25 DIAGNOSIS — Z8673 Personal history of transient ischemic attack (TIA), and cerebral infarction without residual deficits: Secondary | ICD-10-CM | POA: Diagnosis not present

## 2022-06-25 DIAGNOSIS — D509 Iron deficiency anemia, unspecified: Secondary | ICD-10-CM | POA: Diagnosis not present

## 2022-06-25 DIAGNOSIS — F02811 Dementia in other diseases classified elsewhere, unspecified severity, with agitation: Secondary | ICD-10-CM | POA: Diagnosis not present

## 2022-06-25 DIAGNOSIS — I10 Essential (primary) hypertension: Secondary | ICD-10-CM | POA: Diagnosis not present

## 2022-06-25 DIAGNOSIS — G311 Senile degeneration of brain, not elsewhere classified: Secondary | ICD-10-CM | POA: Diagnosis not present

## 2022-06-25 DIAGNOSIS — F0283 Dementia in other diseases classified elsewhere, unspecified severity, with mood disturbance: Secondary | ICD-10-CM | POA: Diagnosis not present

## 2022-06-25 DIAGNOSIS — G3189 Other specified degenerative diseases of nervous system: Secondary | ICD-10-CM | POA: Diagnosis not present

## 2022-06-25 DIAGNOSIS — F0282 Dementia in other diseases classified elsewhere, unspecified severity, with psychotic disturbance: Secondary | ICD-10-CM | POA: Diagnosis not present

## 2022-06-27 DIAGNOSIS — G311 Senile degeneration of brain, not elsewhere classified: Secondary | ICD-10-CM | POA: Diagnosis not present

## 2022-06-27 DIAGNOSIS — D509 Iron deficiency anemia, unspecified: Secondary | ICD-10-CM | POA: Diagnosis not present

## 2022-06-27 DIAGNOSIS — G3189 Other specified degenerative diseases of nervous system: Secondary | ICD-10-CM | POA: Diagnosis not present

## 2022-06-27 DIAGNOSIS — F02811 Dementia in other diseases classified elsewhere, unspecified severity, with agitation: Secondary | ICD-10-CM | POA: Diagnosis not present

## 2022-06-27 DIAGNOSIS — F0282 Dementia in other diseases classified elsewhere, unspecified severity, with psychotic disturbance: Secondary | ICD-10-CM | POA: Diagnosis not present

## 2022-06-27 DIAGNOSIS — F0283 Dementia in other diseases classified elsewhere, unspecified severity, with mood disturbance: Secondary | ICD-10-CM | POA: Diagnosis not present

## 2022-06-28 DIAGNOSIS — G3189 Other specified degenerative diseases of nervous system: Secondary | ICD-10-CM | POA: Diagnosis not present

## 2022-06-28 DIAGNOSIS — D509 Iron deficiency anemia, unspecified: Secondary | ICD-10-CM | POA: Diagnosis not present

## 2022-06-28 DIAGNOSIS — F0282 Dementia in other diseases classified elsewhere, unspecified severity, with psychotic disturbance: Secondary | ICD-10-CM | POA: Diagnosis not present

## 2022-06-28 DIAGNOSIS — G311 Senile degeneration of brain, not elsewhere classified: Secondary | ICD-10-CM | POA: Diagnosis not present

## 2022-06-28 DIAGNOSIS — F0283 Dementia in other diseases classified elsewhere, unspecified severity, with mood disturbance: Secondary | ICD-10-CM | POA: Diagnosis not present

## 2022-06-28 DIAGNOSIS — F02811 Dementia in other diseases classified elsewhere, unspecified severity, with agitation: Secondary | ICD-10-CM | POA: Diagnosis not present

## 2022-06-29 DIAGNOSIS — F0283 Dementia in other diseases classified elsewhere, unspecified severity, with mood disturbance: Secondary | ICD-10-CM | POA: Diagnosis not present

## 2022-06-29 DIAGNOSIS — G311 Senile degeneration of brain, not elsewhere classified: Secondary | ICD-10-CM | POA: Diagnosis not present

## 2022-06-29 DIAGNOSIS — G3189 Other specified degenerative diseases of nervous system: Secondary | ICD-10-CM | POA: Diagnosis not present

## 2022-06-29 DIAGNOSIS — F02811 Dementia in other diseases classified elsewhere, unspecified severity, with agitation: Secondary | ICD-10-CM | POA: Diagnosis not present

## 2022-06-29 DIAGNOSIS — D509 Iron deficiency anemia, unspecified: Secondary | ICD-10-CM | POA: Diagnosis not present

## 2022-06-29 DIAGNOSIS — F0282 Dementia in other diseases classified elsewhere, unspecified severity, with psychotic disturbance: Secondary | ICD-10-CM | POA: Diagnosis not present

## 2022-06-30 DIAGNOSIS — F02811 Dementia in other diseases classified elsewhere, unspecified severity, with agitation: Secondary | ICD-10-CM | POA: Diagnosis not present

## 2022-06-30 DIAGNOSIS — D509 Iron deficiency anemia, unspecified: Secondary | ICD-10-CM | POA: Diagnosis not present

## 2022-06-30 DIAGNOSIS — F0282 Dementia in other diseases classified elsewhere, unspecified severity, with psychotic disturbance: Secondary | ICD-10-CM | POA: Diagnosis not present

## 2022-06-30 DIAGNOSIS — G311 Senile degeneration of brain, not elsewhere classified: Secondary | ICD-10-CM | POA: Diagnosis not present

## 2022-06-30 DIAGNOSIS — G3189 Other specified degenerative diseases of nervous system: Secondary | ICD-10-CM | POA: Diagnosis not present

## 2022-06-30 DIAGNOSIS — F0283 Dementia in other diseases classified elsewhere, unspecified severity, with mood disturbance: Secondary | ICD-10-CM | POA: Diagnosis not present

## 2022-07-04 DIAGNOSIS — F0282 Dementia in other diseases classified elsewhere, unspecified severity, with psychotic disturbance: Secondary | ICD-10-CM | POA: Diagnosis not present

## 2022-07-04 DIAGNOSIS — G311 Senile degeneration of brain, not elsewhere classified: Secondary | ICD-10-CM | POA: Diagnosis not present

## 2022-07-04 DIAGNOSIS — D509 Iron deficiency anemia, unspecified: Secondary | ICD-10-CM | POA: Diagnosis not present

## 2022-07-04 DIAGNOSIS — G3189 Other specified degenerative diseases of nervous system: Secondary | ICD-10-CM | POA: Diagnosis not present

## 2022-07-04 DIAGNOSIS — F02811 Dementia in other diseases classified elsewhere, unspecified severity, with agitation: Secondary | ICD-10-CM | POA: Diagnosis not present

## 2022-07-04 DIAGNOSIS — F0283 Dementia in other diseases classified elsewhere, unspecified severity, with mood disturbance: Secondary | ICD-10-CM | POA: Diagnosis not present

## 2022-07-05 DIAGNOSIS — D509 Iron deficiency anemia, unspecified: Secondary | ICD-10-CM | POA: Diagnosis not present

## 2022-07-05 DIAGNOSIS — F02811 Dementia in other diseases classified elsewhere, unspecified severity, with agitation: Secondary | ICD-10-CM | POA: Diagnosis not present

## 2022-07-05 DIAGNOSIS — F0282 Dementia in other diseases classified elsewhere, unspecified severity, with psychotic disturbance: Secondary | ICD-10-CM | POA: Diagnosis not present

## 2022-07-05 DIAGNOSIS — F0283 Dementia in other diseases classified elsewhere, unspecified severity, with mood disturbance: Secondary | ICD-10-CM | POA: Diagnosis not present

## 2022-07-05 DIAGNOSIS — G3189 Other specified degenerative diseases of nervous system: Secondary | ICD-10-CM | POA: Diagnosis not present

## 2022-07-05 DIAGNOSIS — G311 Senile degeneration of brain, not elsewhere classified: Secondary | ICD-10-CM | POA: Diagnosis not present

## 2022-07-06 DIAGNOSIS — F0282 Dementia in other diseases classified elsewhere, unspecified severity, with psychotic disturbance: Secondary | ICD-10-CM | POA: Diagnosis not present

## 2022-07-06 DIAGNOSIS — G3189 Other specified degenerative diseases of nervous system: Secondary | ICD-10-CM | POA: Diagnosis not present

## 2022-07-06 DIAGNOSIS — G311 Senile degeneration of brain, not elsewhere classified: Secondary | ICD-10-CM | POA: Diagnosis not present

## 2022-07-06 DIAGNOSIS — F0283 Dementia in other diseases classified elsewhere, unspecified severity, with mood disturbance: Secondary | ICD-10-CM | POA: Diagnosis not present

## 2022-07-06 DIAGNOSIS — D509 Iron deficiency anemia, unspecified: Secondary | ICD-10-CM | POA: Diagnosis not present

## 2022-07-06 DIAGNOSIS — F02811 Dementia in other diseases classified elsewhere, unspecified severity, with agitation: Secondary | ICD-10-CM | POA: Diagnosis not present

## 2022-07-07 DIAGNOSIS — F02811 Dementia in other diseases classified elsewhere, unspecified severity, with agitation: Secondary | ICD-10-CM | POA: Diagnosis not present

## 2022-07-07 DIAGNOSIS — F0282 Dementia in other diseases classified elsewhere, unspecified severity, with psychotic disturbance: Secondary | ICD-10-CM | POA: Diagnosis not present

## 2022-07-07 DIAGNOSIS — F0283 Dementia in other diseases classified elsewhere, unspecified severity, with mood disturbance: Secondary | ICD-10-CM | POA: Diagnosis not present

## 2022-07-07 DIAGNOSIS — D509 Iron deficiency anemia, unspecified: Secondary | ICD-10-CM | POA: Diagnosis not present

## 2022-07-07 DIAGNOSIS — G3189 Other specified degenerative diseases of nervous system: Secondary | ICD-10-CM | POA: Diagnosis not present

## 2022-07-07 DIAGNOSIS — G311 Senile degeneration of brain, not elsewhere classified: Secondary | ICD-10-CM | POA: Diagnosis not present

## 2022-07-14 ENCOUNTER — Encounter: Payer: Self-pay | Admitting: Oncology

## 2022-07-25 DEATH — deceased

## 2022-08-11 ENCOUNTER — Encounter: Payer: Self-pay | Admitting: Oncology

## 2022-08-17 ENCOUNTER — Encounter: Payer: Self-pay | Admitting: Oncology

## 2022-08-18 ENCOUNTER — Ambulatory Visit (INDEPENDENT_AMBULATORY_CARE_PROVIDER_SITE_OTHER): Payer: Self-pay | Admitting: Internal Medicine

## 2022-08-18 DIAGNOSIS — E7800 Pure hypercholesterolemia, unspecified: Secondary | ICD-10-CM

## 2022-08-18 DIAGNOSIS — I10 Essential (primary) hypertension: Secondary | ICD-10-CM

## 2022-08-18 NOTE — Progress Notes (Signed)
Patient ID: Rachel Martinez, female   DOB: 05/26/1927, 89 y.o.   MRN: [id number] Pt deceased.

## 2022-08-18 NOTE — Progress Notes (Deleted)
Subjective:    Patient ID: Rachel Martinez, female    DOB: Jan 25, 1927, 89 y.o.   MRN: [id number]  Patient here for No chief complaint on file.   HPI Here for scheduled follow up. Was seen ER 4/15 - after a fall. CT head and CT thoracic and lumbar spine - negative for acute injury. Also evaluated 3/18 ER - fall. CT - no acute injury. Saw ortho 3/15 - right shoulder pain. S/p injection. Last visit - increased confusion and worsening dementia.  Started on seroquel. Continues on zoloft. Had discussed hospice referral.    Past Medical History:  Diagnosis Date   Anemia    Chicken pox    CVA (cerebral vascular accident) (HCC) 8/06   Depression    GERD (gastroesophageal reflux disease)    Heart palpitations    Hypercholesteremia    Hypertension    Stroke [facility])    Past Surgical History:  Procedure Laterality Date   APPENDECTOMY     Family History  Problem Relation Age of Onset   CVA Mother    Diabetes Father    Heart disease Father    Cancer Sister        breast    Diabetes Sister    Cancer Brother        stomach   Leukemia Brother    Lung disease Brother    Hypertension Sister    Cancer Sister        stomach   Diabetes Sister    Mental illness Brother        suicide   Breast cancer Paternal Aunt    Diabetes Son    Social History   Socioeconomic History   Marital status: Widowed    Spouse name: Not on file   Number of children: 3   Years of education: Not on file   Highest education level: Not on file  Occupational History   Occupation: Retired  Tobacco Use   Smoking status: Former    Current packs/day: 0.00    Average packs/day: 0.3 packs/day for 30.0 years (7.5 ttl pk-yrs)    Types: Cigarettes    Start date: 09/12/1973    Quit date: 09/13/2003    Years since quitting: 18.9   Smokeless tobacco: Never  Vaping Use   Vaping status: Never Used  Substance and Sexual Activity   Alcohol use: No    Alcohol/week: 0.0 standard drinks of alcohol   Drug use: No    Sexual activity: Never  Other Topics Concern   Not on file  Social History Narrative   Not on file   Social Determinants of Health   Financial Resource Strain: Low Risk  (06/10/2022)   Overall Financial Resource Strain (CARDIA)    Difficulty of Paying Living Expenses: Not hard at all  Food Insecurity: No Food Insecurity (06/10/2022)   Hunger Vital Sign    Worried About Running Out of Food in the Last Year: Never true    Ran Out of Food in the Last Year: Never true  Transportation Needs: No Transportation Needs (06/10/2022)   PRAPARE - Administrator, Civil Service (Medical): No    Lack of Transportation (Non-Medical): No  Physical Activity: Inactive (06/10/2022)   Exercise Vital Sign    Days of Exercise per Week: 0 days    Minutes of Exercise per Session: 0 min  Stress: No Stress Concern Present (06/10/2022)   [employer] of Occupational Health - Occupational Stress Questionnaire    Feeling of  Stress : Not at all  Social Connections: Socially Isolated (06/10/2022)   Social Connection and Isolation Panel [NHANES]    Frequency of Communication with Friends and Family: Never    Frequency of Social Gatherings with Friends and Family: More than three times a week    Attends Religious Services: Never    Database administrator or Organizations: No    Attends Banker Meetings: Never    Marital Status: Widowed     Review of Systems     Objective:     There were no vitals taken for this visit. Wt Readings from Last 3 Encounters:  05/19/22 128 lb (58.1 kg)  05/09/22 129 lb 10.1 oz (58.8 kg)  03/10/22 129 lb 9.6 oz (58.8 kg)    Physical Exam   Outpatient Encounter Medications as of 08/18/2022  Medication Sig   busPIRone (BUSPAR) 5 MG tablet Take 1 tablet (5 mg total) by mouth daily.   nitroGLYCERIN (NITROSTAT) 0.4 MG SL tablet Place 1 tablet (0.4 mg total) under the tongue every 5 (five) minutes as needed for chest pain.   QUEtiapine (SEROQUEL) 25  MG tablet 1/2 tablet q hs   sertraline (ZOLOFT) 50 MG tablet TAKE 1 TABLET(50 MG) BY MOUTH DAILY   No facility-administered encounter medications on file as of 08/18/2022.     Lab Results  Component Value Date   WBC 6.2 05/19/2022   HGB 13.1 05/19/2022   HCT 38.5 05/19/2022   PLT 299.0 05/19/2022   GLUCOSE 115 (H) 05/19/2022   CHOL 227 (H) 11/06/2020   TRIG 114.0 11/06/2020   HDL 74.30 11/06/2020   LDLCALC 130 (H) 11/06/2020   ALT 11 05/19/2022   AST 23 05/19/2022   NA 141 05/19/2022   K 4.0 05/19/2022   CL 104 05/19/2022   CREATININE 1.16 05/19/2022   BUN 27 (H) 05/19/2022   CO2 28 05/19/2022   TSH 2.08 11/11/2021   HGBA1C 5.4 10/29/2018    CT HEAD WO CONTRAST ( )  Result Date: 05/09/2022 CLINICAL DATA:  Head trauma, minor (Age >= 65y); Back trauma, no prior imaging (Age >= 16y); Neck trauma (Age >= 65y) EXAM: CT HEAD WITHOUT CONTRAST CT CERVICAL SPINE WITHOUT CONTRAST CT THORACIC SPINE WITHOUT CONTRAST CT LUMBAR SPINE WITHOUT CONTRAST TECHNIQUE: Multidetector CT imaging of the head and cervical, thoracic and lumbar spine was performed following the standard protocol without intravenous contrast. Multiplanar CT image reconstructions of the cervical spine were also generated. RADIATION DOSE REDUCTION: This exam was performed according to the departmental dose-optimization program which includes automated exposure control, adjustment of the mA and/or kV according to patient size and/or use of iterative reconstruction technique. COMPARISON:  None Available. FINDINGS: CT HEAD FINDINGS Brain: No evidence of acute infarction, hemorrhage, hydrocephalus, extra-axial collection or mass lesion/mass effect. Similar patchy white matter hypodensities, compatible with chronic microvascular ischemic disease. Similar cerebral atrophy. Vascular: Calcific atherosclerosis. Skull: No acute fracture. Sinuses/Orbits: No acute finding. CT CERVICAL SPINE FINDINGS Alignment: Mild through of C7 on T1, likely  degenerative. Skull base and vertebrae: No evidence of acute fracture. Multilevel degenerative disc disease and facet/uncovertebral hypertrophy. Soft tissues and spinal canal: No prevertebral fluid or swelling. No visible canal hematoma. Disc levels: Multilevel degenerative disc disease and facet arthropathy. Upper chest: Biapical pleuroparenchymal scarring. CT THORACIC SPINE FINDINGS Alignment: Mild scoliosis.  No substantial sagittal subluxation Skull base and vertebrae: No evidence of acute fracture. Disc levels: Multilevel degenerative disc disease and facet arthropathy. Upper chest: Emphysema.  Aortic atherosclerosis. CT LUMBAR SPINE  FINDINGS Alignment: No substantial sagittal subluxation.  Scoliosis. Skull base and vertebrae: Vertebral body heights are maintained. No evidence of acute fracture. Soft tissues and spinal canal: No prevertebral fluid or swelling. No visible canal hematoma. Disc levels: Multilevel degenerative disease and facet arthropathy, greatest in the lower lumbar spine. IMPRESSION: 1. No evidence of acute intracranial abnormality. Senescent changes. 2. No evidence of acute fracture or traumatic malalignment in the cervical, thoracic or lumbar spine. Multilevel degenerative change. 3. Aortic Atherosclerosis (ICD10-I70.0) and Emphysema (ICD10-J43.9). Electronically Signed   By: Feliberto Harts M.D.   On: 05/09/2022 09:01   CT Cervical Spine Wo Contrast  Result Date: 05/09/2022 CLINICAL DATA:  Head trauma, minor (Age >= 65y); Back trauma, no prior imaging (Age >= 16y); Neck trauma (Age >= 65y) EXAM: CT HEAD WITHOUT CONTRAST CT CERVICAL SPINE WITHOUT CONTRAST CT THORACIC SPINE WITHOUT CONTRAST CT LUMBAR SPINE WITHOUT CONTRAST TECHNIQUE: Multidetector CT imaging of the head and cervical, thoracic and lumbar spine was performed following the standard protocol without intravenous contrast. Multiplanar CT image reconstructions of the cervical spine were also generated. RADIATION DOSE REDUCTION:  This exam was performed according to the departmental dose-optimization program which includes automated exposure control, adjustment of the mA and/or kV according to patient size and/or use of iterative reconstruction technique. COMPARISON:  None Available. FINDINGS: CT HEAD FINDINGS Brain: No evidence of acute infarction, hemorrhage, hydrocephalus, extra-axial collection or mass lesion/mass effect. Similar patchy white matter hypodensities, compatible with chronic microvascular ischemic disease. Similar cerebral atrophy. Vascular: Calcific atherosclerosis. Skull: No acute fracture. Sinuses/Orbits: No acute finding. CT CERVICAL SPINE FINDINGS Alignment: Mild through of C7 on T1, likely degenerative. Skull base and vertebrae: No evidence of acute fracture. Multilevel degenerative disc disease and facet/uncovertebral hypertrophy. Soft tissues and spinal canal: No prevertebral fluid or swelling. No visible canal hematoma. Disc levels: Multilevel degenerative disc disease and facet arthropathy. Upper chest: Biapical pleuroparenchymal scarring. CT THORACIC SPINE FINDINGS Alignment: Mild scoliosis.  No substantial sagittal subluxation Skull base and vertebrae: No evidence of acute fracture. Disc levels: Multilevel degenerative disc disease and facet arthropathy. Upper chest: Emphysema.  Aortic atherosclerosis. CT LUMBAR SPINE FINDINGS Alignment: No substantial sagittal subluxation.  Scoliosis. Skull base and vertebrae: Vertebral body heights are maintained. No evidence of acute fracture. Soft tissues and spinal canal: No prevertebral fluid or swelling. No visible canal hematoma. Disc levels: Multilevel degenerative disease and facet arthropathy, greatest in the lower lumbar spine. IMPRESSION: 1. No evidence of acute intracranial abnormality. Senescent changes. 2. No evidence of acute fracture or traumatic malalignment in the cervical, thoracic or lumbar spine. Multilevel degenerative change. 3. Aortic Atherosclerosis  (ICD10-I70.0) and Emphysema (ICD10-J43.9). Electronically Signed   By: Feliberto Harts M.D.   On: 05/09/2022 09:01   CT Thoracic Spine Wo Contrast  Result Date: 05/09/2022 CLINICAL DATA:  Head trauma, minor (Age >= 65y); Back trauma, no prior imaging (Age >= 16y); Neck trauma (Age >= 65y) EXAM: CT HEAD WITHOUT CONTRAST CT CERVICAL SPINE WITHOUT CONTRAST CT THORACIC SPINE WITHOUT CONTRAST CT LUMBAR SPINE WITHOUT CONTRAST TECHNIQUE: Multidetector CT imaging of the head and cervical, thoracic and lumbar spine was performed following the standard protocol without intravenous contrast. Multiplanar CT image reconstructions of the cervical spine were also generated. RADIATION DOSE REDUCTION: This exam was performed according to the departmental dose-optimization program which includes automated exposure control, adjustment of the mA and/or kV according to patient size and/or use of iterative reconstruction technique. COMPARISON:  None Available. FINDINGS: CT HEAD FINDINGS Brain: No evidence of acute infarction, hemorrhage,  hydrocephalus, extra-axial collection or mass lesion/mass effect. Similar patchy white matter hypodensities, compatible with chronic microvascular ischemic disease. Similar cerebral atrophy. Vascular: Calcific atherosclerosis. Skull: No acute fracture. Sinuses/Orbits: No acute finding. CT CERVICAL SPINE FINDINGS Alignment: Mild through of C7 on T1, likely degenerative. Skull base and vertebrae: No evidence of acute fracture. Multilevel degenerative disc disease and facet/uncovertebral hypertrophy. Soft tissues and spinal canal: No prevertebral fluid or swelling. No visible canal hematoma. Disc levels: Multilevel degenerative disc disease and facet arthropathy. Upper chest: Biapical pleuroparenchymal scarring. CT THORACIC SPINE FINDINGS Alignment: Mild scoliosis.  No substantial sagittal subluxation Skull base and vertebrae: No evidence of acute fracture. Disc levels: Multilevel degenerative disc  disease and facet arthropathy. Upper chest: Emphysema.  Aortic atherosclerosis. CT LUMBAR SPINE FINDINGS Alignment: No substantial sagittal subluxation.  Scoliosis. Skull base and vertebrae: Vertebral body heights are maintained. No evidence of acute fracture. Soft tissues and spinal canal: No prevertebral fluid or swelling. No visible canal hematoma. Disc levels: Multilevel degenerative disease and facet arthropathy, greatest in the lower lumbar spine. IMPRESSION: 1. No evidence of acute intracranial abnormality. Senescent changes. 2. No evidence of acute fracture or traumatic malalignment in the cervical, thoracic or lumbar spine. Multilevel degenerative change. 3. Aortic Atherosclerosis (ICD10-I70.0) and Emphysema (ICD10-J43.9). Electronically Signed   By: Feliberto Harts M.D.   On: 05/09/2022 09:01   CT Lumbar Spine Wo Contrast  Result Date: 05/09/2022 CLINICAL DATA:  Head trauma, minor (Age >= 65y); Back trauma, no prior imaging (Age >= 16y); Neck trauma (Age >= 65y) EXAM: CT HEAD WITHOUT CONTRAST CT CERVICAL SPINE WITHOUT CONTRAST CT THORACIC SPINE WITHOUT CONTRAST CT LUMBAR SPINE WITHOUT CONTRAST TECHNIQUE: Multidetector CT imaging of the head and cervical, thoracic and lumbar spine was performed following the standard protocol without intravenous contrast. Multiplanar CT image reconstructions of the cervical spine were also generated. RADIATION DOSE REDUCTION: This exam was performed according to the departmental dose-optimization program which includes automated exposure control, adjustment of the mA and/or kV according to patient size and/or use of iterative reconstruction technique. COMPARISON:  None Available. FINDINGS: CT HEAD FINDINGS Brain: No evidence of acute infarction, hemorrhage, hydrocephalus, extra-axial collection or mass lesion/mass effect. Similar patchy white matter hypodensities, compatible with chronic microvascular ischemic disease. Similar cerebral atrophy. Vascular: Calcific  atherosclerosis. Skull: No acute fracture. Sinuses/Orbits: No acute finding. CT CERVICAL SPINE FINDINGS Alignment: Mild through of C7 on T1, likely degenerative. Skull base and vertebrae: No evidence of acute fracture. Multilevel degenerative disc disease and facet/uncovertebral hypertrophy. Soft tissues and spinal canal: No prevertebral fluid or swelling. No visible canal hematoma. Disc levels: Multilevel degenerative disc disease and facet arthropathy. Upper chest: Biapical pleuroparenchymal scarring. CT THORACIC SPINE FINDINGS Alignment: Mild scoliosis.  No substantial sagittal subluxation Skull base and vertebrae: No evidence of acute fracture. Disc levels: Multilevel degenerative disc disease and facet arthropathy. Upper chest: Emphysema.  Aortic atherosclerosis. CT LUMBAR SPINE FINDINGS Alignment: No substantial sagittal subluxation.  Scoliosis. Skull base and vertebrae: Vertebral body heights are maintained. No evidence of acute fracture. Soft tissues and spinal canal: No prevertebral fluid or swelling. No visible canal hematoma. Disc levels: Multilevel degenerative disease and facet arthropathy, greatest in the lower lumbar spine. IMPRESSION: 1. No evidence of acute intracranial abnormality. Senescent changes. 2. No evidence of acute fracture or traumatic malalignment in the cervical, thoracic or lumbar spine. Multilevel degenerative change. 3. Aortic Atherosclerosis (ICD10-I70.0) and Emphysema (ICD10-J43.9). Electronically Signed   By: Feliberto Harts M.D.   On: 05/09/2022 09:01       Assessment &  Plan:  There are no diagnoses linked to this encounter.   Dale Crook, MD

## 2023-11-28 IMAGING — CR DG CHEST 2V
2 series · 2 of 2 positions shown · non-contrast
Comparison: 10/22/2016

CLINICAL DATA: Chest pain.

EXAM:
CHEST - 2 VIEW

[chest lat]
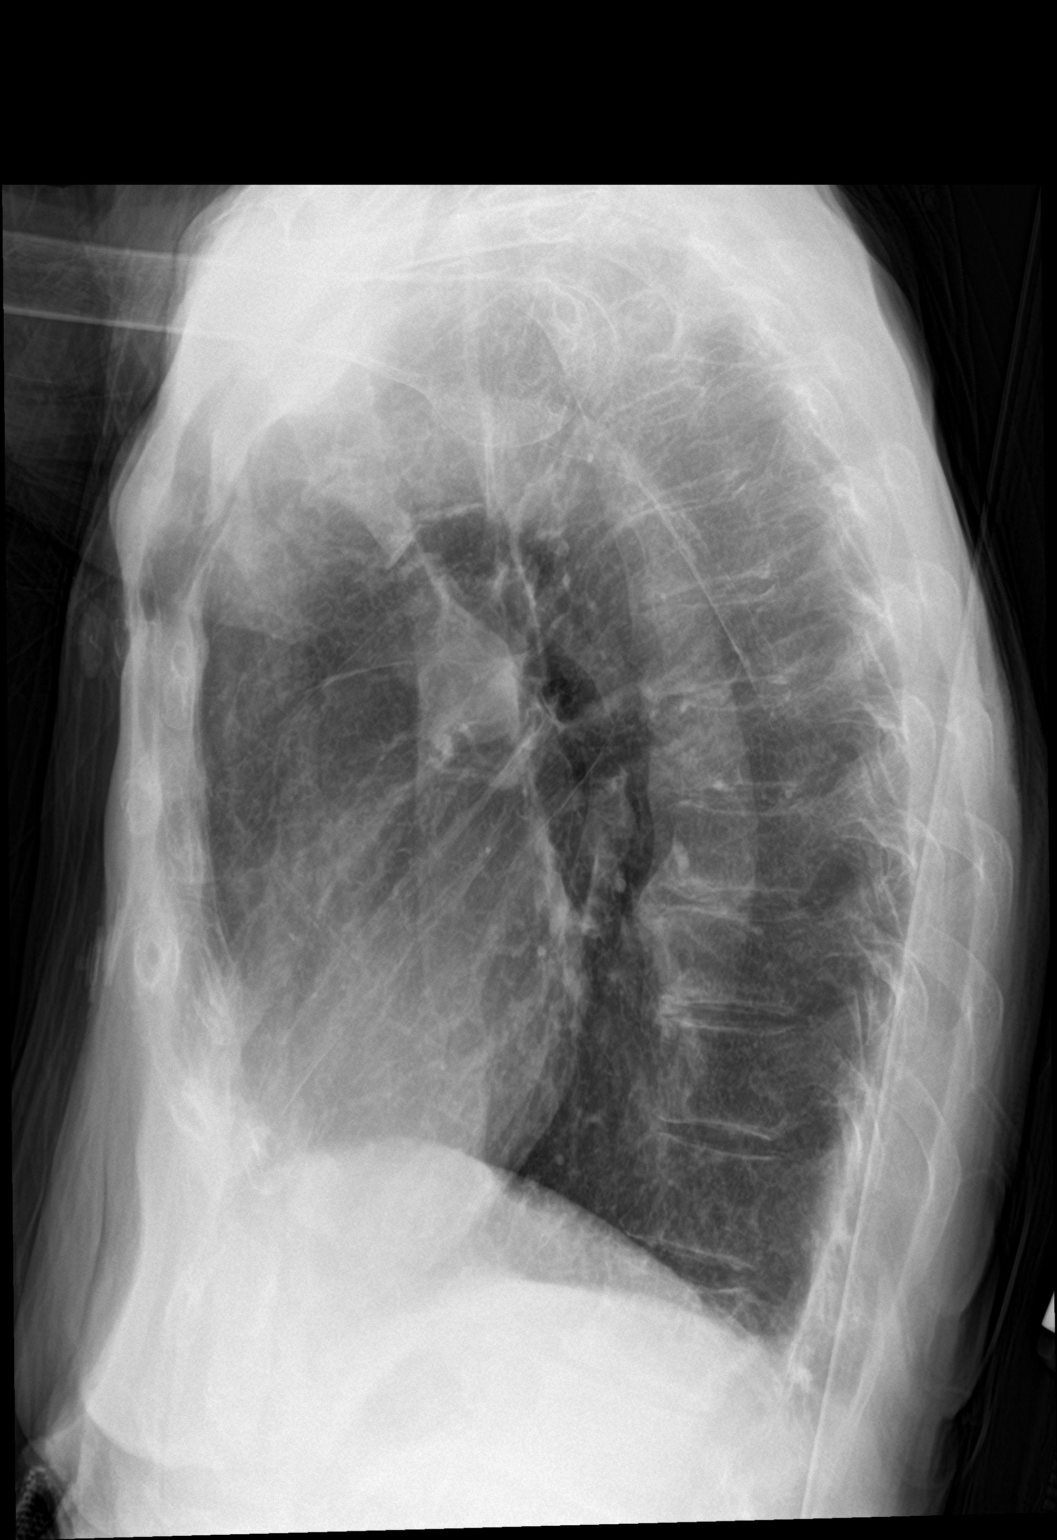

[chest ap]
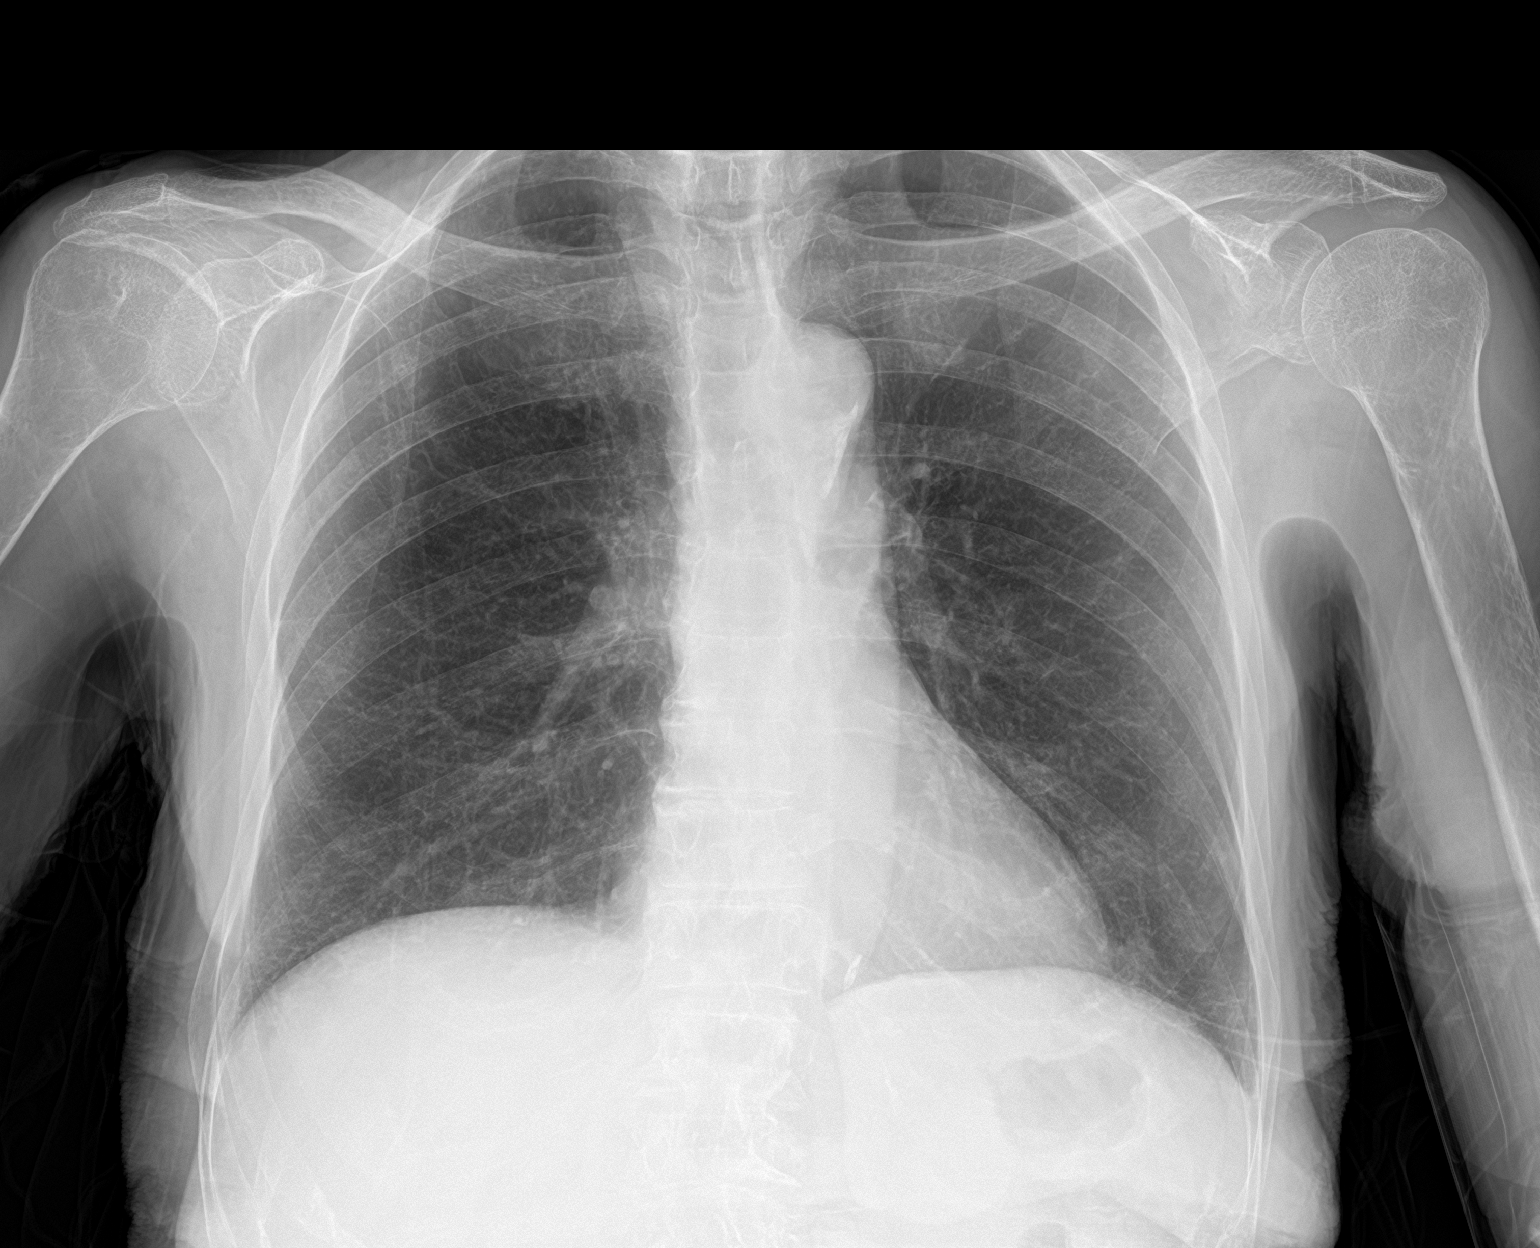

[2 of 2 positions shown; findings below may reference images not displayed]

FINDINGS: Lungs are hyperexpanded. The lungs are clear without focal
pneumonia, edema, pneumothorax or pleural effusion. The
cardiopericardial silhouette is within normal limits for size. Bones
are diffusely demineralized.
IMPRESSION: No active cardiopulmonary disease.

## 2023-11-30 IMAGING — DX DG CHEST 1V PORT
1 series · 1 of 1 positions shown · non-contrast
Comparison: 04/11/2021

CLINICAL DATA: Chest pain

EXAM:
PORTABLE CHEST 1 VIEW

[chest ap]
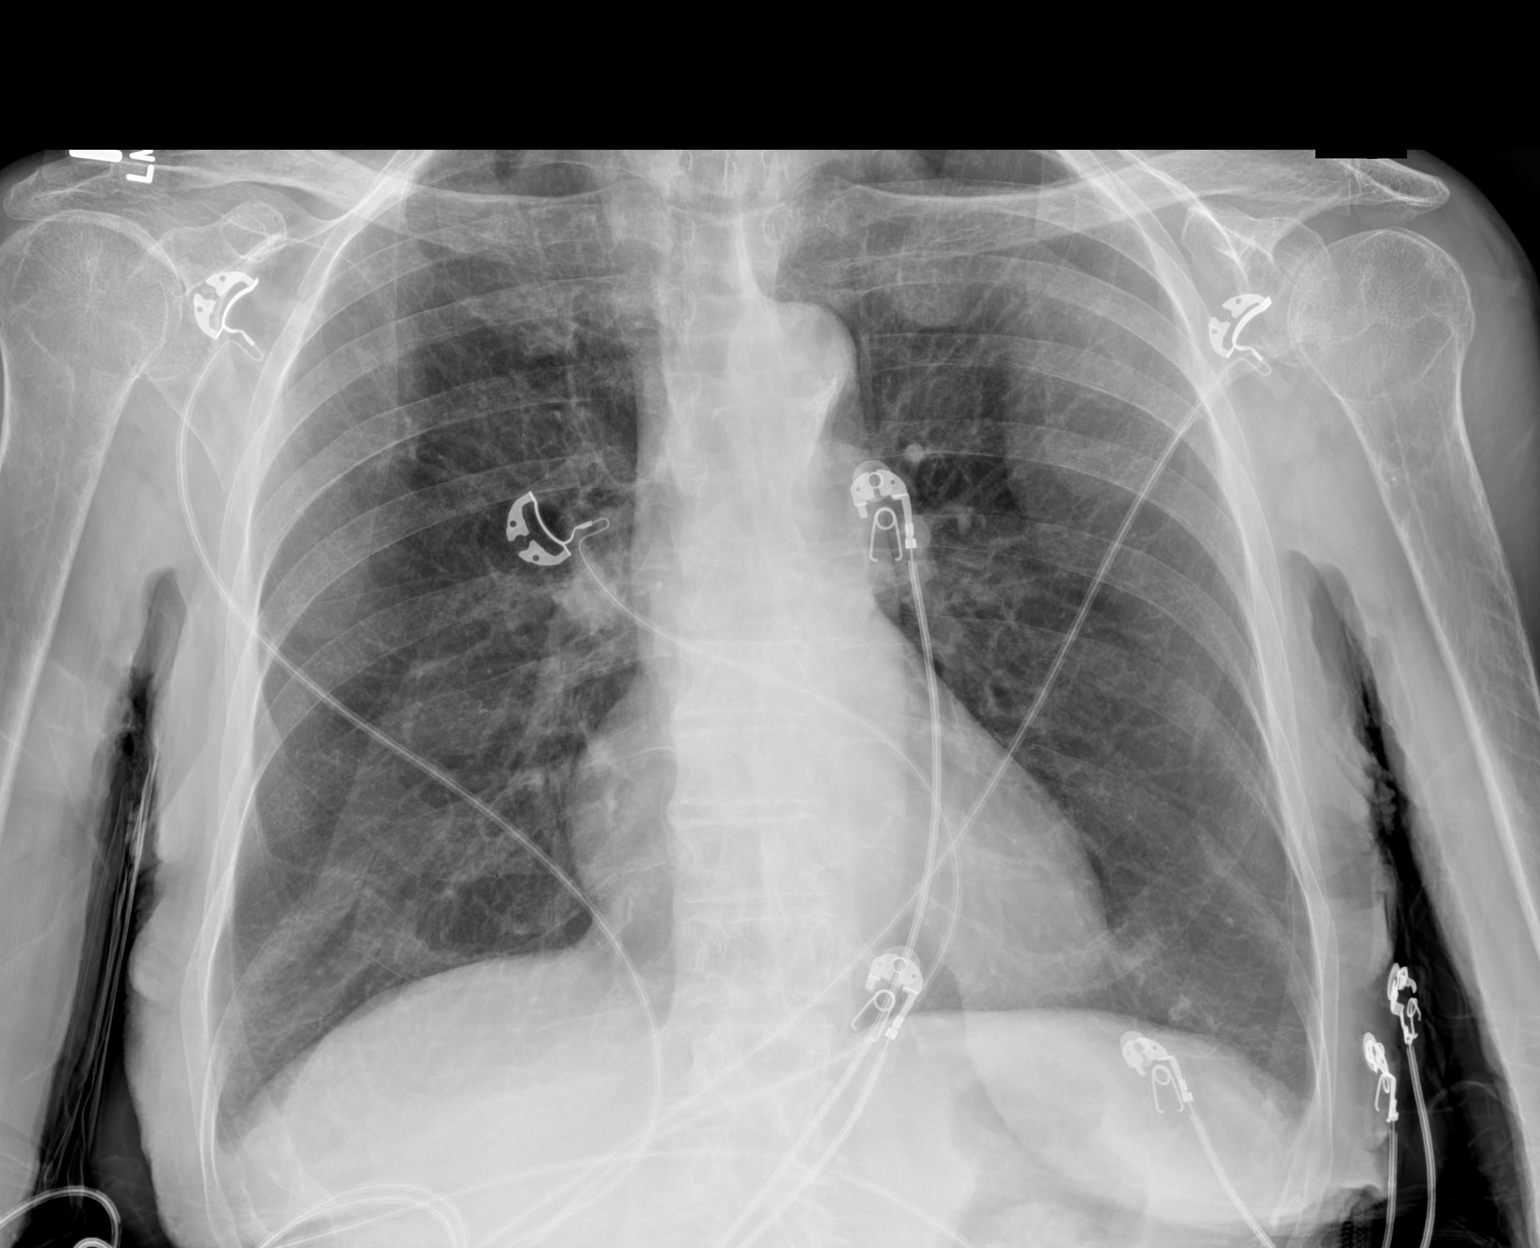

[1 of 1 positions shown; findings below may reference images not displayed]

FINDINGS: Lungs are clear.  No pleural effusion or pneumothorax.

The heart is normal in size.  Thoracic aortic atherosclerosis.
IMPRESSION: No evidence of acute cardiopulmonary disease.
